# Patient Record
Sex: Male | Born: 1950 | Race: White | Hispanic: No | Marital: Married | Smoking: Current every day smoker
Health system: Southern US, Community
[De-identification: ages and names within clinical notes are randomized; demographics above are authoritative.]

## PROBLEM LIST (undated history)

## (undated) DIAGNOSIS — E669 Obesity, unspecified: Secondary | ICD-10-CM

## (undated) DIAGNOSIS — H9192 Unspecified hearing loss, left ear: Secondary | ICD-10-CM

## (undated) DIAGNOSIS — Z86010 Personal history of colonic polyps: Secondary | ICD-10-CM

## (undated) DIAGNOSIS — M1712 Unilateral primary osteoarthritis, left knee: Secondary | ICD-10-CM

## (undated) DIAGNOSIS — Z0000 Encounter for general adult medical examination without abnormal findings: Secondary | ICD-10-CM

## (undated) DIAGNOSIS — R5383 Other fatigue: Secondary | ICD-10-CM

## (undated) DIAGNOSIS — F329 Major depressive disorder, single episode, unspecified: Secondary | ICD-10-CM

## (undated) DIAGNOSIS — F17200 Nicotine dependence, unspecified, uncomplicated: Secondary | ICD-10-CM

## (undated) DIAGNOSIS — R5381 Other malaise: Secondary | ICD-10-CM

## (undated) DIAGNOSIS — G43909 Migraine, unspecified, not intractable, without status migrainosus: Secondary | ICD-10-CM

## (undated) DIAGNOSIS — F32A Depression, unspecified: Secondary | ICD-10-CM

## (undated) DIAGNOSIS — I10 Essential (primary) hypertension: Secondary | ICD-10-CM

## (undated) DIAGNOSIS — K115 Sialolithiasis: Secondary | ICD-10-CM

## (undated) DIAGNOSIS — E785 Hyperlipidemia, unspecified: Secondary | ICD-10-CM

## (undated) DIAGNOSIS — J189 Pneumonia, unspecified organism: Secondary | ICD-10-CM

## (undated) DIAGNOSIS — N400 Enlarged prostate without lower urinary tract symptoms: Secondary | ICD-10-CM

## (undated) DIAGNOSIS — E1169 Type 2 diabetes mellitus with other specified complication: Secondary | ICD-10-CM

## (undated) HISTORY — DX: Unspecified hearing loss, left ear: H91.92

## (undated) HISTORY — DX: Hyperlipidemia, unspecified: E78.5

## (undated) HISTORY — DX: Personal history of colonic polyps: Z86.010

## (undated) HISTORY — DX: Nicotine dependence, unspecified, uncomplicated: F17.200

## (undated) HISTORY — DX: Other malaise: R53.81

## (undated) HISTORY — PX: FINGER FRACTURE SURGERY: SHX638

## (undated) HISTORY — PX: OTHER SURGICAL HISTORY: SHX169

## (undated) HISTORY — DX: Obesity, unspecified: E66.9

## (undated) HISTORY — DX: Migraine, unspecified, not intractable, without status migrainosus: G43.909

## (undated) HISTORY — DX: Type 2 diabetes mellitus with other specified complication: E11.69

## (undated) HISTORY — DX: Encounter for general adult medical examination without abnormal findings: Z00.00

## (undated) HISTORY — DX: Other fatigue: R53.83

## (undated) HISTORY — DX: Essential (primary) hypertension: I10

## (undated) HISTORY — DX: Unilateral primary osteoarthritis, left knee: M17.12

## (undated) HISTORY — DX: Sialolithiasis: K11.5

---

## 2007-04-11 DIAGNOSIS — F32A Depression, unspecified: Secondary | ICD-10-CM | POA: Insufficient documentation

## 2007-04-11 DIAGNOSIS — E669 Obesity, unspecified: Secondary | ICD-10-CM | POA: Insufficient documentation

## 2007-04-11 DIAGNOSIS — E7800 Pure hypercholesterolemia, unspecified: Secondary | ICD-10-CM | POA: Insufficient documentation

## 2007-04-11 HISTORY — DX: Pure hypercholesterolemia, unspecified: E78.00

## 2007-04-11 HISTORY — DX: Obesity, unspecified: E66.9

## 2008-08-03 DIAGNOSIS — J683 Other acute and subacute respiratory conditions due to chemicals, gases, fumes and vapors: Secondary | ICD-10-CM

## 2008-08-03 HISTORY — DX: Other acute and subacute respiratory conditions due to chemicals, gases, fumes and vapors: J68.3

## 2010-04-10 DIAGNOSIS — E119 Type 2 diabetes mellitus without complications: Secondary | ICD-10-CM

## 2010-04-10 DIAGNOSIS — R519 Headache, unspecified: Secondary | ICD-10-CM | POA: Insufficient documentation

## 2010-04-10 DIAGNOSIS — N400 Enlarged prostate without lower urinary tract symptoms: Secondary | ICD-10-CM | POA: Insufficient documentation

## 2010-04-10 HISTORY — DX: Benign prostatic hyperplasia without lower urinary tract symptoms: N40.0

## 2010-04-10 HISTORY — DX: Type 2 diabetes mellitus without complications: E11.9

## 2010-04-10 HISTORY — DX: Headache, unspecified: R51.9

## 2010-06-13 DIAGNOSIS — N529 Male erectile dysfunction, unspecified: Secondary | ICD-10-CM

## 2010-06-13 HISTORY — DX: Male erectile dysfunction, unspecified: N52.9

## 2011-08-20 DIAGNOSIS — R6882 Decreased libido: Secondary | ICD-10-CM

## 2011-08-20 HISTORY — DX: Decreased libido: R68.82

## 2011-09-24 DIAGNOSIS — I10 Essential (primary) hypertension: Secondary | ICD-10-CM

## 2011-09-24 DIAGNOSIS — F419 Anxiety disorder, unspecified: Secondary | ICD-10-CM

## 2011-09-24 DIAGNOSIS — E291 Testicular hypofunction: Secondary | ICD-10-CM

## 2011-09-24 HISTORY — DX: Testicular hypofunction: E29.1

## 2011-09-24 HISTORY — DX: Anxiety disorder, unspecified: F41.9

## 2011-09-24 HISTORY — DX: Essential (primary) hypertension: I10

## 2012-05-22 ENCOUNTER — Ambulatory Visit (INDEPENDENT_AMBULATORY_CARE_PROVIDER_SITE_OTHER): Payer: BC Managed Care – PPO | Admitting: Internal Medicine

## 2012-05-22 ENCOUNTER — Encounter: Payer: Self-pay | Admitting: Internal Medicine

## 2012-05-22 VITALS — BP 152/88 | HR 102 | Temp 98.4°F | Resp 16 | Ht 70.5 in | Wt 236.0 lb

## 2012-05-22 DIAGNOSIS — E669 Obesity, unspecified: Secondary | ICD-10-CM

## 2012-05-22 DIAGNOSIS — Z79899 Other long term (current) drug therapy: Secondary | ICD-10-CM

## 2012-05-22 DIAGNOSIS — E1169 Type 2 diabetes mellitus with other specified complication: Secondary | ICD-10-CM

## 2012-05-22 DIAGNOSIS — I10 Essential (primary) hypertension: Secondary | ICD-10-CM

## 2012-05-22 DIAGNOSIS — J069 Acute upper respiratory infection, unspecified: Secondary | ICD-10-CM

## 2012-05-22 DIAGNOSIS — E119 Type 2 diabetes mellitus without complications: Secondary | ICD-10-CM

## 2012-05-22 DIAGNOSIS — E785 Hyperlipidemia, unspecified: Secondary | ICD-10-CM

## 2012-05-22 MED ORDER — AZITHROMYCIN 250 MG PO TABS: ORAL_TABLET | ORAL | Status: AC

## 2012-05-22 MED ORDER — HYDROCOD POLST-CHLORPHEN POLST 10-8 MG/5ML PO LQCR: 5.0000 mL | Freq: Every evening | ORAL | Status: DC | PRN

## 2012-05-22 NOTE — Patient Instructions (Signed)
Please schedule fasting labs for Monday- cbc, chem7, a1c, urine microalbumin-250.00 and lipid/lft-272.4 Also schedule chem7, a1c-250.00 prior to next visit

## 2012-05-25 DIAGNOSIS — E1169 Type 2 diabetes mellitus with other specified complication: Secondary | ICD-10-CM

## 2012-05-25 DIAGNOSIS — I10 Essential (primary) hypertension: Secondary | ICD-10-CM

## 2012-05-25 DIAGNOSIS — E669 Obesity, unspecified: Secondary | ICD-10-CM

## 2012-05-25 DIAGNOSIS — J069 Acute upper respiratory infection, unspecified: Secondary | ICD-10-CM | POA: Insufficient documentation

## 2012-05-25 DIAGNOSIS — E785 Hyperlipidemia, unspecified: Secondary | ICD-10-CM | POA: Insufficient documentation

## 2012-05-25 HISTORY — DX: Hyperlipidemia, unspecified: E78.5

## 2012-05-25 HISTORY — DX: Obesity, unspecified: E66.9

## 2012-05-25 HISTORY — DX: Type 2 diabetes mellitus with other specified complication: E11.69

## 2012-05-25 HISTORY — DX: Essential (primary) hypertension: I10

## 2012-05-25 NOTE — Assessment & Plan Note (Signed)
Given printed antibiotic to hold. Begin if symptoms do not improve after total duration of 8-10 days. Given Tussionex when necessary cough-cautioned regarding possible sedating effect. Followup if no improvement or worsening.

## 2012-05-25 NOTE — Assessment & Plan Note (Signed)
Obtain CBC, Chem-7, A1c and urine micro-albumen.

## 2012-05-25 NOTE — Assessment & Plan Note (Signed)
Suboptimal control. Patient will check dose of lisinopril at home and call clinic. Refill medication at that time and monitor blood pressure regularly

## 2012-05-25 NOTE — Assessment & Plan Note (Signed)
Maintained on statin therapy. Obtain fasting lipid profile and liver function tests 

## 2012-05-25 NOTE — Progress Notes (Signed)
  Subjective:    Patient ID: Nathan Hess, male    DOB: [birth year], 61 y.o.   MRN: [id number]  HPI patient presents to clinic to establish care and followup of multiple medical problems. Blood pressure elevated without symptoms. States it is consistently elevated but were in fosinopril. Dose is unknown. Has history of diabetes with unknown control. Diabetic eye exam up to date. Meds one-week history of cough productive for green sputum and ear fullness. No fever chills shortness of breath or wheezing. No other complaints.  PMH: DM, hyperlipidemia, HTN No past surgical history on file.  reports that he has been smoking Cigarettes.  He does not have any smokeless tobacco history on file. His alcohol and drug histories not on file. family history is not on file. No Known Allergies   Review of Systems  Constitutional: Negative for fever and chills.  HENT: Positive for sinus pressure.   Respiratory: Positive for cough. Negative for shortness of breath and wheezing.   Cardiovascular: Negative for chest pain.  All other systems reviewed and are negative.       Objective:   Physical Exam  Nursing note and vitals reviewed. Constitutional: He appears well-developed and well-nourished. No distress.  HENT:  Head: Normocephalic and atraumatic.  Right Ear: External ear normal.  Left Ear: External ear normal.  Nose: Nose normal.  Mouth/Throat: Oropharynx is clear and moist. No oropharyngeal exudate.  Eyes: Conjunctivae normal are normal. No scleral icterus.  Neck: Neck supple. No thyromegaly present.  Cardiovascular: Normal rate, regular rhythm and normal heart sounds.  Exam reveals no gallop and no friction rub.   No murmur Hess. Pulmonary/Chest: Effort normal and breath sounds normal. No respiratory distress. He has no wheezes. He has no rales.  Lymphadenopathy:    He has no cervical adenopathy.  Neurological: He is alert.  Skin: Skin is warm and dry. He is not diaphoretic.           Assessment & Plan:

## 2012-06-04 ENCOUNTER — Telehealth: Payer: Self-pay | Admitting: Internal Medicine

## 2012-06-04 DIAGNOSIS — Z79899 Other long term (current) drug therapy: Secondary | ICD-10-CM

## 2012-06-04 DIAGNOSIS — E119 Type 2 diabetes mellitus without complications: Secondary | ICD-10-CM

## 2012-06-04 NOTE — Telephone Encounter (Signed)
Lab order week of 08-11-2012 Also schedule chem7, a1c-250.00 prior to next visit

## 2012-07-14 ENCOUNTER — Telehealth: Payer: Self-pay | Admitting: *Deleted

## 2012-07-14 DIAGNOSIS — E1169 Type 2 diabetes mellitus with other specified complication: Secondary | ICD-10-CM

## 2012-07-14 DIAGNOSIS — E669 Obesity, unspecified: Secondary | ICD-10-CM

## 2012-07-14 DIAGNOSIS — Z79899 Other long term (current) drug therapy: Secondary | ICD-10-CM

## 2012-07-14 DIAGNOSIS — E785 Hyperlipidemia, unspecified: Secondary | ICD-10-CM

## 2012-07-14 DIAGNOSIS — I10 Essential (primary) hypertension: Secondary | ICD-10-CM

## 2012-07-14 MED ORDER — VENLAFAXINE HCL 75 MG PO TABS: 75.0000 mg | ORAL_TABLET | Freq: Three times a day (TID) | ORAL | Status: DC

## 2012-07-14 NOTE — Addendum Note (Signed)
Addended by: Regis Bill on: 07/14/2012 09:16 AM   Modules accepted: Orders

## 2012-07-14 NOTE — Telephone Encounter (Signed)
Pt requesting refill on Effexor, will run out on Tuesday; pt did not do f/u labs requested at 10.31.13 OV but did schedule f.u OV 01.30.14. Rx done for 30-day & pt informed to keep f/u OV appt & have Fasting labs done 3-5 days prior, pt understood & agreed Andrey Cota been working 60+ hrs weekly]/SLS

## 2012-07-28 LAB — HEPATIC FUNCTION PANEL
AST: 21 U/L (ref 0–37)
Albumin: 4.8 g/dL (ref 3.5–5.2)
Alkaline Phosphatase: 64 U/L (ref 39–117)
Indirect Bilirubin: 0.4 mg/dL (ref 0.0–0.9)
Total Protein: 7.3 g/dL (ref 6.0–8.3)

## 2012-07-28 LAB — LIPID PANEL
HDL: 39 mg/dL — ABNORMAL LOW (ref >39)
LDL Cholesterol: 147 mg/dL — ABNORMAL HIGH (ref 0–99)
Triglycerides: 121 mg/dL (ref <150)

## 2012-07-28 LAB — BASIC METABOLIC PANEL
BUN: 24 mg/dL — ABNORMAL HIGH (ref 6–23)
Chloride: 103 mEq/L (ref 96–112)
Potassium: 4.9 mEq/L (ref 3.5–5.3)
Sodium: 139 mEq/L (ref 135–145)

## 2012-07-28 LAB — CBC WITH DIFFERENTIAL/PLATELET
Eosinophils Absolute: 0.1 10*3/uL (ref 0.0–0.7)
Eosinophils Relative: 1 % (ref 0–5)
HCT: 44.3 % (ref 39.0–52.0)
Hemoglobin: 15.8 g/dL (ref 13.0–17.0)
Lymphocytes Relative: 26 % (ref 12–46)
Lymphs Abs: 2 10*3/uL (ref 0.7–4.0)
MCH: 32 pg (ref 26.0–34.0)
MCV: 89.7 fL (ref 78.0–100.0)
Monocytes Absolute: 0.6 10*3/uL (ref 0.1–1.0)
Monocytes Relative: 8 % (ref 3–12)
Platelets: 232 10*3/uL (ref 150–400)
RBC: 4.94 MIL/uL (ref 4.22–5.81)
WBC: 7.9 10*3/uL (ref 4.0–10.5)

## 2012-07-28 NOTE — Telephone Encounter (Signed)
Lab orders released/SLS 

## 2012-07-28 NOTE — Addendum Note (Signed)
Addended by: Regis Bill on: 07/28/2012 10:23 AM   Modules accepted: Orders

## 2012-07-28 NOTE — Telephone Encounter (Signed)
Lab orders placed 12.23.13; released to [facility] lab/SLS

## 2012-07-29 LAB — MICROALBUMIN / CREATININE URINE RATIO
Creatinine, Urine: 133.7 mg/dL
Microalb Creat Ratio: 3.7 mg/g (ref 0.0–30.0)
Microalb, Ur: 0.5 mg/dL (ref 0.00–1.89)

## 2012-07-31 ENCOUNTER — Encounter: Payer: Self-pay | Admitting: Internal Medicine

## 2012-07-31 ENCOUNTER — Ambulatory Visit (INDEPENDENT_AMBULATORY_CARE_PROVIDER_SITE_OTHER): Payer: BC Managed Care – PPO | Admitting: Internal Medicine

## 2012-07-31 VITALS — BP 150/98 | HR 107 | Temp 98.2°F | Resp 16 | Ht 70.5 in | Wt 234.0 lb

## 2012-07-31 DIAGNOSIS — E785 Hyperlipidemia, unspecified: Secondary | ICD-10-CM

## 2012-07-31 DIAGNOSIS — E119 Type 2 diabetes mellitus without complications: Secondary | ICD-10-CM

## 2012-07-31 DIAGNOSIS — Z23 Encounter for immunization: Secondary | ICD-10-CM

## 2012-07-31 DIAGNOSIS — E1169 Type 2 diabetes mellitus with other specified complication: Secondary | ICD-10-CM

## 2012-07-31 DIAGNOSIS — G43909 Migraine, unspecified, not intractable, without status migrainosus: Secondary | ICD-10-CM

## 2012-07-31 DIAGNOSIS — I10 Essential (primary) hypertension: Secondary | ICD-10-CM

## 2012-07-31 DIAGNOSIS — E669 Obesity, unspecified: Secondary | ICD-10-CM

## 2012-07-31 MED ORDER — VENLAFAXINE HCL 75 MG PO TABS: 75.0000 mg | ORAL_TABLET | Freq: Three times a day (TID) | ORAL | Status: DC

## 2012-07-31 MED ORDER — ROSUVASTATIN CALCIUM 20 MG PO TABS: 20.0000 mg | ORAL_TABLET | Freq: Every day | ORAL | Status: DC

## 2012-07-31 MED ORDER — LISINOPRIL 10 MG PO TABS: 10.0000 mg | ORAL_TABLET | Freq: Every day | ORAL | Status: DC

## 2012-07-31 MED ORDER — HYDROCODONE-ACETAMINOPHEN 5-325 MG PO TABS: 1.0000 | ORAL_TABLET | Freq: Four times a day (QID) | ORAL | Status: DC | PRN

## 2012-07-31 NOTE — Patient Instructions (Addendum)
Please schedule fasting labs prior to next visit Chem7, a1c-250.0 and lipid/lft-272.4 

## 2012-08-03 DIAGNOSIS — G43909 Migraine, unspecified, not intractable, without status migrainosus: Secondary | ICD-10-CM | POA: Insufficient documentation

## 2012-08-03 HISTORY — DX: Migraine, unspecified, not intractable, without status migrainosus: G43.909

## 2012-08-03 NOTE — Assessment & Plan Note (Signed)
Given small quantity of hydrocodone for sparing use. Aware of potential for addiction and/or tolerance

## 2012-08-03 NOTE — Assessment & Plan Note (Signed)
suboptimal control. Resume statin tx.

## 2012-08-03 NOTE — Assessment & Plan Note (Signed)
a1c under average control. Encourage diet/exercise and wt loss.

## 2012-08-03 NOTE — Assessment & Plan Note (Signed)
Out of medication. Refill and monitor bp as outpt.

## 2012-08-03 NOTE — Progress Notes (Signed)
  Subjective:    Patient ID: Nathan Hess, male    DOB: 10/06/1950, 62 y.o.   MRN: [id number]  HPI Pt presents to clinic for followup of multiple medical problems. BP elevated but has been out of bp medication. No associated sx's. Has rare use of hydrocodone for migraine ha's. No evidence of addiction or tolerance. H/o hypogonadism but testosterone supplementation was too expensive.  No past medical history on file. No past surgical history on file.  reports that he has been smoking Cigarettes.  He does not have any smokeless tobacco history on file. His alcohol and drug histories not on file. family history is not on file. No Known Allergies    Review of Systems see hpi     Objective:   Physical Exam  Physical Exam  Nursing note and vitals reviewed. Constitutional: Appears well-developed and well-nourished. No distress.  HENT:  Head: Normocephalic and atraumatic.  Right Ear: External ear normal.  Left Ear: External ear normal.  Eyes: Conjunctivae are normal. No scleral icterus.  Neck: Neck supple. Carotid bruit is not present.  Cardiovascular: Normal rate, regular rhythm and normal heart sounds.  Exam reveals no gallop and no friction rub.   No murmur heard. Pulmonary/Chest: Effort normal and breath sounds normal. No respiratory distress. He has no wheezes. no rales.  Lymphadenopathy:    He has no cervical adenopathy.  Neurological:Alert.  Skin: Skin is warm and dry. Not diaphoretic.  Psychiatric: Has a normal mood and affect.        Assessment & Plan:

## 2012-08-21 ENCOUNTER — Ambulatory Visit: Payer: BC Managed Care – PPO | Admitting: Internal Medicine

## 2012-08-21 ENCOUNTER — Telehealth: Payer: Self-pay | Admitting: Internal Medicine

## 2012-08-21 MED ORDER — VENLAFAXINE HCL 75 MG PO TABS: 75.0000 mg | ORAL_TABLET | Freq: Three times a day (TID) | ORAL | Status: DC

## 2012-08-21 NOTE — Telephone Encounter (Signed)
He came to see Dr Rodena Medin to change doctors from his doctor in [place].  They discussed his meds.  His rx for effexor 75mg   Take 1 pill 3 times per day qty 90. Did not get this prescription.  Needs to go to [facility] on [address] in [place]

## 2012-10-14 ENCOUNTER — Telehealth: Payer: Self-pay | Admitting: *Deleted

## 2012-10-14 MED ORDER — VENLAFAXINE HCL 75 MG PO TABS: 75.0000 mg | ORAL_TABLET | Freq: Three times a day (TID) | ORAL | Status: DC

## 2012-10-14 NOTE — Telephone Encounter (Signed)
Received call from [facility] in [place] requesting to give pt 90 days supply of venlafaxine (#270). Gave verbal to dispense #270 x no refills as pt has f/u in April.

## 2012-10-30 ENCOUNTER — Encounter: Payer: Self-pay | Admitting: Family Medicine

## 2012-10-30 ENCOUNTER — Ambulatory Visit (INDEPENDENT_AMBULATORY_CARE_PROVIDER_SITE_OTHER): Payer: BC Managed Care – PPO | Admitting: Family Medicine

## 2012-10-30 VITALS — BP 132/82 | HR 88 | Temp 98.5°F | Ht 70.5 in | Wt 228.0 lb

## 2012-10-30 DIAGNOSIS — E669 Obesity, unspecified: Secondary | ICD-10-CM

## 2012-10-30 DIAGNOSIS — E119 Type 2 diabetes mellitus without complications: Secondary | ICD-10-CM

## 2012-10-30 DIAGNOSIS — E785 Hyperlipidemia, unspecified: Secondary | ICD-10-CM

## 2012-10-30 DIAGNOSIS — E1169 Type 2 diabetes mellitus with other specified complication: Secondary | ICD-10-CM

## 2012-10-30 DIAGNOSIS — R5383 Other fatigue: Secondary | ICD-10-CM

## 2012-10-30 DIAGNOSIS — F17200 Nicotine dependence, unspecified, uncomplicated: Secondary | ICD-10-CM

## 2012-10-30 DIAGNOSIS — Z0000 Encounter for general adult medical examination without abnormal findings: Secondary | ICD-10-CM

## 2012-10-30 DIAGNOSIS — R5381 Other malaise: Secondary | ICD-10-CM

## 2012-10-30 HISTORY — DX: Nicotine dependence, unspecified, uncomplicated: F17.200

## 2012-10-30 MED ORDER — VENLAFAXINE HCL 75 MG PO TABS: 75.0000 mg | ORAL_TABLET | Freq: Three times a day (TID) | ORAL | Status: DC

## 2012-11-01 ENCOUNTER — Encounter: Payer: Self-pay | Admitting: Family Medicine

## 2012-11-01 DIAGNOSIS — R5381 Other malaise: Secondary | ICD-10-CM

## 2012-11-01 DIAGNOSIS — R5383 Other fatigue: Secondary | ICD-10-CM | POA: Insufficient documentation

## 2012-11-01 HISTORY — DX: Other fatigue: R53.83

## 2012-11-01 HISTORY — DX: Other malaise: R53.81

## 2012-11-01 NOTE — Progress Notes (Signed)
Patient ID: Nathan Hess, male   DOB: 23-Jul-1951, 62 y.o.   MRN: [id number] KORBAN SHEARER [id number] 04-05-1951 11/01/2012      Progress Note-Follow Up  Subjective  Chief Complaint  Chief Complaint  Patient presents with  . Follow-up    3 month    HPI  Patient is a 62 year old male who is in today for followup. He struggles with fatigue. Notes some restless sleep and a.m. headaches as well. No recent illness. No congestion or chest pain. No palpitations or shortness of breath. No GI or GU complaints. He is taking his medications as prescribed.  Past Medical History  Diagnosis Date  . Migraine headache 08/03/2012  . Unspecified essential hypertension 05/25/2012  . Diabetes mellitus type 2 in obese 05/25/2012  . Hyperlipidemia 05/25/2012  . Tobacco use disorder 10/30/2012  . Other malaise and fatigue 11/01/2012    History reviewed. No pertinent past surgical history.  History reviewed. No pertinent family history.  History   Social History  . Marital Status: Married    Spouse Name: N/A    Number of Children: N/A  . Years of Education: N/A   Occupational History  . Not on file.   Social History Main Topics  . Smoking status: Current Every Day Smoker    Types: Cigarettes  . Smokeless tobacco: Not on file  . Alcohol Use: Not on file  . Drug Use: Not on file  . Sexually Active: Not on file   Other Topics Concern  . Not on file   Social History Narrative  . No narrative on file    Current Outpatient Prescriptions on File Prior to Visit  Medication Sig Dispense Refill  . acetaminophen (TYLENOL) 325 MG tablet Take 650 mg by mouth as needed.      . cyanocobalamin 100 MCG tablet Take 100 mcg by mouth daily.      Marland Kitchen lisinopril (PRINIVIL,ZESTRIL) 10 MG tablet Take 1 tablet (10 mg total) by mouth daily.  30 tablet  6  . metFORMIN (GLUCOPHAGE) 500 MG tablet Take 500 mg by mouth daily with breakfast.      . rosuvastatin (CRESTOR) 20 MG tablet Take 1 tablet (20 mg  total) by mouth daily.  30 tablet  6  . HYDROcodone-acetaminophen (NORCO/VICODIN) 5-325 MG per tablet Take 1 tablet by mouth every 6 (six) hours as needed for pain.  15 tablet  1   No current facility-administered medications on file prior to visit.    No Known Allergies  Review of Systems  Review of Systems  Constitutional: Positive for malaise/fatigue. Negative for fever.  HENT: Negative for congestion.   Eyes: Negative for discharge.  Respiratory: Negative for shortness of breath.   Cardiovascular: Negative for chest pain, palpitations and leg swelling.  Gastrointestinal: Negative for nausea, abdominal pain and diarrhea.  Genitourinary: Negative for dysuria.  Musculoskeletal: Negative for falls.  Skin: Negative for rash.  Neurological: Positive for headaches. Negative for loss of consciousness.  Endo/Heme/Allergies: Negative for polydipsia.  Psychiatric/Behavioral: Negative for depression and suicidal ideas. The patient is not nervous/anxious and does not have insomnia.     Objective  BP 132/82  Pulse 88  Temp(Src) 98.5 F (36.9 C) (Oral)  Ht 5' 10.5" (1.791 m)  Wt 228 lb (103.42 kg)  BMI 32.24 kg/m2  SpO2 95%  Physical Exam  Physical Exam  Constitutional: He is oriented to person, place, and time and well-developed, well-nourished, and in no distress. No distress.  HENT:  Head: Normocephalic and atraumatic.  Eyes: Conjunctivae are normal.  Neck: Neck supple. No thyromegaly present.  Cardiovascular: Normal rate, regular rhythm and normal heart sounds.   No murmur heard. Pulmonary/Chest: Effort normal and breath sounds normal. No respiratory distress.  Abdominal: He exhibits no distension and no mass. There is no tenderness.  Musculoskeletal: He exhibits no edema.  Neurological: He is alert and oriented to person, place, and time.  Skin: Skin is warm.  Psychiatric: Memory, affect and judgment normal.    No results found for this basename: TSH   Lab Results   Component Value Date   WBC 7.9 07/28/2012   HGB 15.8 07/28/2012   HCT 44.3 07/28/2012   MCV 89.7 07/28/2012   PLT 232 07/28/2012   Lab Results  Component Value Date   CREATININE 0.92 07/28/2012   BUN 24* 07/28/2012   NA 139 07/28/2012   K 4.9 07/28/2012   CL 103 07/28/2012   CO2 29 07/28/2012   Lab Results  Component Value Date   ALT 42 07/28/2012   AST 21 07/28/2012   ALKPHOS 64 07/28/2012   BILITOT 0.5 07/28/2012   Lab Results  Component Value Date   CHOL 210* 07/28/2012   Lab Results  Component Value Date   HDL 39* 07/28/2012   Lab Results  Component Value Date   LDLCALC 147* 07/28/2012   Lab Results  Component Value Date   TRIG 121 07/28/2012   Lab Results  Component Value Date   CHOLHDL 5.4 07/28/2012     Assessment & Plan  Other malaise and fatigue Acknowledges snoring, am HA, has restless sleep and HTN, encouraged sleep study, he agrees he probably procced with sleep study at next visit.  He will call if he agrees to proceed sooner.   Tobacco use disorder Encouraged complete cessation.   Diabetes mellitus type 2 in obese hgba1c 6.6, minimzie simple carbs and continue current meds.   Hyperlipidemia Tolerating Crestor, avoid trans fats and increase activity, start  Krill oil cap

## 2012-11-01 NOTE — Assessment & Plan Note (Signed)
Encouraged complete cessation. 

## 2012-11-01 NOTE — Assessment & Plan Note (Signed)
Acknowledges snoring, am HA, has restless sleep and HTN, encouraged sleep study, he agrees he probably procced with sleep study at next visit.  He will call if he agrees to proceed sooner.

## 2012-11-01 NOTE — Assessment & Plan Note (Signed)
hgba1c 6.6, minimzie simple carbs and continue current meds.

## 2012-11-01 NOTE — Assessment & Plan Note (Addendum)
Tolerating Crestor, avoid trans fats and increase activity, start  Krill oil cap

## 2012-11-04 ENCOUNTER — Telehealth: Payer: Self-pay | Admitting: Family Medicine

## 2012-11-04 MED ORDER — ROSUVASTATIN CALCIUM 20 MG PO TABS: 20.0000 mg | ORAL_TABLET | Freq: Every day | ORAL | Status: DC

## 2012-11-04 NOTE — Telephone Encounter (Signed)
Refill- crestor 40mg  tablet. Qty 90

## 2013-01-29 ENCOUNTER — Ambulatory Visit (INDEPENDENT_AMBULATORY_CARE_PROVIDER_SITE_OTHER): Payer: BC Managed Care – PPO | Admitting: Internal Medicine

## 2013-01-29 VITALS — BP 128/82 | HR 86 | Temp 97.7°F | Resp 18 | Ht 71.5 in | Wt 235.0 lb

## 2013-01-29 DIAGNOSIS — H5712 Ocular pain, left eye: Secondary | ICD-10-CM

## 2013-01-29 DIAGNOSIS — H109 Unspecified conjunctivitis: Secondary | ICD-10-CM

## 2013-01-29 DIAGNOSIS — H5710 Ocular pain, unspecified eye: Secondary | ICD-10-CM

## 2013-01-29 MED ORDER — OFLOXACIN 0.3 % OP SOLN: 1.0000 [drp] | OPHTHALMIC | Status: DC

## 2013-01-29 NOTE — Patient Instructions (Addendum)
Eye, Foreign Body The term foreign body refers to any object near, on the surface of or in the eye that should not be there. A foreign body may be a small speck of dirt or dust, a hair or eyelash, a splinter or any object. CAUSES  Foreign bodies can get in the eye by:  Flying pieces of something that was broken or destroyed (debris).  A sudden injury (trauma) to the eye. SYMPTOMS  Symptoms depend on what the foreign body is and where it is in the eye. The most common locations are:  On the inner surface of the upper or lower eyelids or on the covering of the white part of the eye (conjunctiva). Symptoms in this location are:  Irritating and painful, especially when blinking.  Feeling like something is in the eye.  On the surface of the clear covering on the front of the eye (cornea). A corneal foreign body has symptoms that:  Are painful and irritating since the cornea is very sensitive.  Form small "rust rings" around a metallic foreign body. Metallic foreign bodies stick more firmly to the surface of the cornea.  Inside the eyeball. Infection can happen fast and can be hard to treat with antibiotics. This is an extremely dangerous situation. Foreign bodies inside the eye can threaten vision. A person may even loose their eye. Foreign bodies inside the eye may cause:  Great pain.  Immediate loss of vision. DIAGNOSIS  Foreign bodies are found during an exam by an eye specialist. Those that are on the eyelids, conjunctiva or cornea are usually (but not always) easily found. When a foreign body is inside the eyeball, a cataract may form almost right away. This makes it hard for an ophthalmologist to find the foreign body. Special tests may be needed, including ultrasound testing, X-rays and CT scans. TREATMENT   Foreign bodies that are on the eyelids, conjunctiva or cornea are often removed easily and painlessly.  If the foreign body has caused a scratch or abrasion of the cornea,  antibiotic drops, ointments and/or a tight patch called a "pressure patch" may be needed. Follow-up exams will be needed for several days until the abrasion heals.  Surgery is needed right away if the foreign body is inside the eyeball. This is a medical emergency. An antibiotic therapy will likely be given to stop an infection. HOME CARE INSTRUCTIONS  The use of eye patches is not universal. Their use varies from state to state and from caregiver to caregiver. If an eye patch was applied:  Keep the eye patch on for as long as directed by your caregiver until the follow-up appointment.  Do not remove the patch to put in medications unless instructed to do so. When replacing the patch, retape it as it was before. Follow the same procedure if the patch becomes loose.  WARNING: Do not drive or operate machinery while the eye is patched. The ability to judge distances will be impaired.  Only take over-the-counter or prescription medicines for pain, discomfort or fever as directed by the caregiver. If no eye patch was applied:  Keep the eye closed as much as possible. Do not rub the eye.  Wear dark glasses as needed to protect the eyes from bright light.  Do not wear contact lenses until the eye feels normal again, or as instructed.  Wear protective eye covering if there is a risk of eye injury. This is important when working with high speed tools.  Only take over-the-counter or   feels normal again, or as instructed.   Wear protective eye covering if there is a risk of eye injury. This is important when working with high speed tools.   Only take over-the-counter or prescription medicines for pain, discomfort or fever as directed by the caregiver.  SEEK IMMEDIATE MEDICAL CARE IF:    Pain increases in the eye or the vision changes.   You or your child has problems with the eye patch.   The injury to the eye appears to be getting larger.   There is discharge from the injured eye.   Swelling and/or soreness (inflammation) develops around the affected eye.   You or your child has an oral temperature above 102 F (38.9 C), not controlled by medicine.   Your baby is older than 3  months with a rectal temperature of 102 F (38.9 C) or higher.   Your baby is 3 months old or younger with a rectal temperature of 100.4 F (38 C) or higher.  MAKE SURE YOU:    Understand these instructions.   Will watch your condition.   Will get help right away if you are not doing well or get worse.  Document Released: 07/09/2005 Document Revised: 10/01/2011 Document Reviewed: 02/26/2008  [employer] Patient Information 2014 [employer]

## 2013-01-29 NOTE — Progress Notes (Signed)
  Subjective:    Patient ID: Nathan Hess, male    DOB: 1951-07-09, 62 y.o.   MRN: [id number]  HPI Mowing yard and got FB in eye, better today, but wants to be examined. No eye disease or visual loss, no discharge and minimal red.   Review of Systems     Objective:   Physical Exam  Constitutional: He is oriented to person, place, and time. He appears well-developed and well-nourished.  Eyes: Conjunctivae, EOM and lids are normal. Pupils are equal, round, and reactive to light. No foreign bodies found. Right eye exhibits no discharge. Left eye exhibits no chemosis, no discharge, no exudate and no hordeolum. No foreign body present in the left eye. Left conjunctiva is not injected. Left eye exhibits normal extraocular motion.  Pulmonary/Chest: Effort normal.  Neurological: He is alert and oriented to person, place, and time. He exhibits normal muscle tone. Coordination normal.  Psychiatric: He has a normal mood and affect.   No uptake with fluricein Irrigated copiously       Assessment & Plan:  Normal exam Ocuflox prn/See opthamology if persists

## 2013-02-16 ENCOUNTER — Other Ambulatory Visit: Payer: Self-pay | Admitting: Family Medicine

## 2013-02-17 ENCOUNTER — Telehealth: Payer: Self-pay

## 2013-02-17 ENCOUNTER — Other Ambulatory Visit: Payer: Self-pay

## 2013-02-17 MED ORDER — VENLAFAXINE HCL 75 MG PO TABS: 75.0000 mg | ORAL_TABLET | Freq: Three times a day (TID) | ORAL | Status: DC

## 2013-02-17 NOTE — Telephone Encounter (Signed)
Pharmacy needs 90 day supply of Effexor. RX sent

## 2013-03-26 ENCOUNTER — Encounter: Payer: Self-pay | Admitting: Physician Assistant

## 2013-03-26 ENCOUNTER — Other Ambulatory Visit: Payer: Self-pay | Admitting: Physician Assistant

## 2013-03-26 DIAGNOSIS — M1712 Unilateral primary osteoarthritis, left knee: Secondary | ICD-10-CM | POA: Insufficient documentation

## 2013-03-26 NOTE — H&P (Signed)
TOTAL KNEE ADMISSION H&P  Patient is being admitted for left unicompartmental arthroplasty.  Subjective:  Chief Complaint:left knee pain.  HPI: Nathan Hess, 62 y.o. male, has a history of pain and functional disability in the left knee due to arthritis and has failed non-surgical conservative treatments for greater than 12 weeks to includeNSAID's and/or analgesics, corticosteriod injections, viscosupplementation injections, flexibility and strengthening excercises, use of assistive devices, weight reduction as appropriate and activity modification.  Onset of symptoms was gradual, starting 10 years ago with gradually worsening course since that time. The patient noted prior procedures on the knee to include  arthroscopy and menisectomy on the left knee(s).  Patient currently rates pain in the left knee(s) at 2 out of 10 with activity. Patient has night pain, worsening of pain with activity and weight bearing, pain that interferes with activities of daily living, crepitus and joint swelling.  Patient has evidence of subchondral sclerosis, periarticular osteophytes and joint space narrowing by imaging studies.There is no active infection.  Patient Active Problem List   Diagnosis Date Noted  . Left knee DJD   . Other malaise and fatigue 11/01/2012  . Tobacco use disorder 10/30/2012  . Migraine headache 08/03/2012  . Diabetes mellitus type 2 in obese 05/25/2012  . Hyperlipidemia 05/25/2012  . Unspecified essential hypertension 05/25/2012   Past Medical History  Diagnosis Date  . Migraine headache 08/03/2012  . Unspecified essential hypertension 05/25/2012  . Diabetes mellitus type 2 in obese 05/25/2012  . Hyperlipidemia 05/25/2012  . Tobacco use disorder 10/30/2012  . Other malaise and fatigue 11/01/2012  . Left knee DJD     Past Surgical History  Procedure Laterality Date  . Left hand surgery       (Not in a hospital admission) No Known Allergies   Current Outpatient  Prescriptions on File Prior to Visit  Medication Sig Dispense Refill  . acetaminophen (TYLENOL) 325 MG tablet Take 650 mg by mouth as needed.      . cyanocobalamin 100 MCG tablet Take 100 mcg by mouth daily.      Marland Kitchen lisinopril (PRINIVIL,ZESTRIL) 10 MG tablet Take 1 tablet (10 mg total) by mouth daily.  30 tablet  6  . metFORMIN (GLUCOPHAGE) 500 MG tablet Take 500 mg by mouth daily with breakfast.      . rosuvastatin (CRESTOR) 20 MG tablet Take 1 tablet (20 mg total) by mouth daily.  30 tablet  6  . HYDROcodone-acetaminophen (NORCO/VICODIN) 5-325 MG per tablet Take 1 tablet by mouth every 6 (six) hours as needed for pain.  15 tablet  1  . ofloxacin (OCUFLOX) 0.3 % ophthalmic solution Place 1 drop into the left eye every 4 (four) hours.  5 mL  0   No current facility-administered medications on file prior to visit.     History  Substance Use Topics  . Smoking status: Current Every Day Smoker -- 2.00 packs/day for 15 years    Types: Cigarettes  . Smokeless tobacco: Not on file  . Alcohol Use: No    Family History  Problem Relation Age of Onset  . Alzheimer's disease Mother   . Hypertension Father      Review of Systems  Constitutional: Negative.   HENT: Negative.   Eyes: Negative.   Respiratory: Negative.   Cardiovascular: Negative.   Gastrointestinal: Negative.   Genitourinary: Negative.   Musculoskeletal: Positive for joint pain.       Left knee  Skin: Negative.   Neurological: Negative.   Endo/Heme/Allergies: Negative.  Psychiatric/Behavioral: Negative.     Objective:  Physical Exam  Constitutional: He is oriented to person, place, and time. He appears well-developed and well-nourished.  HENT:  Head: Normocephalic and atraumatic.  Mouth/Throat: Oropharynx is clear and moist.  Eyes: Conjunctivae and EOM are normal. Pupils are equal, round, and reactive to light.  Neck: Neck supple.  Cardiovascular: Normal rate, regular rhythm and normal heart sounds.   Respiratory:  Effort normal and breath sounds normal.  GI: Soft. Bowel sounds are normal.  Genitourinary:  Not pertinent to current symptomatology therefore not examined.  Musculoskeletal:  Examination of his left knee reveals pain on the medial joint line positive medial McMurray's mild varus deformity 1+ crepitation full range of motion knee is stable with normal patella tracking. Exam of the right knee reveals full range of motion without pain swelling weakness or instability. Vascular exam: pulses 2+ and symmetric.  Neurological: He is alert and oriented to person, place, and time.  Skin: Skin is warm and dry.  Psychiatric: He has a normal mood and affect. His behavior is normal.    Vital signs in last 24 hours: Last recorded: 09/04 1300   BP: 146/92 Pulse: 93  Temp: 98.2 F (36.8 C)    Height: 5\' 11"  (1.803 m) SpO2: 97  Weight: 104.781 kg (231 lb)     Labs:   Estimated body mass index is 32.23 kg/(m^2) as calculated from the following:   Height as of this encounter: 5\' 11"  (1.803 m).   Weight as of this encounter: 104.781 kg (231 lb).   Imaging Review Plain radiographs demonstrate moderate degenerative joint disease of the left knee(s). The overall alignment ismild varus. The bone quality appears to be good for age and reported activity level.  Assessment/Plan:  End stage arthritis, left knee   The patient history, physical examination, clinical judgment of the provider and imaging studies are consistent with end stage degenerative joint disease of the left knee(s) and unicompartment knee arthroplasty is deemed medically necessary. The treatment options including medical management, injection therapy arthroscopy and arthroplasty were discussed at length. The risks and benefits of unicompartment knee arthroplasty were presented and reviewed. The risks due to aseptic loosening, infection, stiffness, patella tracking problems, thromboembolic complications and other imponderables were  discussed. The patient acknowledged the explanation, agreed to proceed with the plan and consent was signed. Patient is being admitted for inpatient treatment for surgery, pain control, PT, OT, prophylactic antibiotics, VTE prophylaxis, progressive ambulation and ADL's and discharge planning. The patient is planning to be discharged home with home health services  Anitta Tenny A. Gwinda Passe Physician Assistant [facility] [phone]  03/26/2013, 2:28 PM

## 2013-03-30 ENCOUNTER — Encounter (HOSPITAL_COMMUNITY)
Admission: RE | Admit: 2013-03-30 | Discharge: 2013-03-30 | Disposition: A | Discharge status: Home / Self Care | Payer: BC Managed Care – PPO | Source: Ambulatory Visit | Attending: Orthopedic Surgery | Admitting: Orthopedic Surgery

## 2013-03-30 ENCOUNTER — Encounter (HOSPITAL_COMMUNITY): Payer: Self-pay

## 2013-03-30 ENCOUNTER — Encounter (HOSPITAL_COMMUNITY)
Admission: RE | Admit: 2013-03-30 | Discharge: 2013-03-30 | Disposition: A | Discharge status: Home / Self Care | Payer: BC Managed Care – PPO | Source: Ambulatory Visit | Attending: Physician Assistant | Admitting: Physician Assistant

## 2013-03-30 DIAGNOSIS — Z01812 Encounter for preprocedural laboratory examination: Secondary | ICD-10-CM | POA: Insufficient documentation

## 2013-03-30 DIAGNOSIS — Z01810 Encounter for preprocedural cardiovascular examination: Secondary | ICD-10-CM | POA: Insufficient documentation

## 2013-03-30 DIAGNOSIS — Z01818 Encounter for other preprocedural examination: Secondary | ICD-10-CM | POA: Insufficient documentation

## 2013-03-30 HISTORY — DX: Benign prostatic hyperplasia without lower urinary tract symptoms: N40.0

## 2013-03-30 HISTORY — DX: Depression, unspecified: F32.A

## 2013-03-30 HISTORY — DX: Major depressive disorder, single episode, unspecified: F32.9

## 2013-03-30 LAB — URINALYSIS, ROUTINE W REFLEX MICROSCOPIC
Hgb urine dipstick: NEGATIVE
Nitrite: NEGATIVE
Protein, ur: NEGATIVE mg/dL
Urobilinogen, UA: 0.2 mg/dL (ref 0.0–1.0)

## 2013-03-30 LAB — URINE MICROSCOPIC-ADD ON

## 2013-03-30 LAB — COMPREHENSIVE METABOLIC PANEL
ALT: 32 U/L (ref 0–53)
AST: 19 U/L (ref 0–37)
Albumin: 4.3 g/dL (ref 3.5–5.2)
Alkaline Phosphatase: 60 U/L (ref 39–117)
Calcium: 9.9 mg/dL (ref 8.4–10.5)
GFR calc Af Amer: >90 mL/min (ref >90)
Potassium: 4.8 mEq/L (ref 3.5–5.1)
Sodium: 138 mEq/L (ref 135–145)
Total Protein: 7.6 g/dL (ref 6.0–8.3)

## 2013-03-30 LAB — CBC WITH DIFFERENTIAL/PLATELET
Basophils Absolute: 0.1 10*3/uL (ref 0.0–0.1)
Basophils Relative: 1 % (ref 0–1)
Eosinophils Absolute: 0.1 10*3/uL (ref 0.0–0.7)
Eosinophils Relative: 1 % (ref 0–5)
MCH: 32.9 pg (ref 26.0–34.0)
MCV: 90.6 fL (ref 78.0–100.0)
Neutrophils Relative %: 63 % (ref 43–77)
Platelets: 227 10*3/uL (ref 150–400)
RBC: 4.87 MIL/uL (ref 4.22–5.81)
RDW: 12.8 % (ref 11.5–15.5)
WBC: 10.2 10*3/uL (ref 4.0–10.5)

## 2013-03-30 LAB — TYPE AND SCREEN
ABO/RH(D): A NEG
Antibody Screen: NEGATIVE

## 2013-03-30 LAB — SURGICAL PCR SCREEN
MRSA, PCR: NEGATIVE
Staphylococcus aureus: POSITIVE — AB

## 2013-03-30 NOTE — Progress Notes (Signed)
03/30/13 1125  OBSTRUCTIVE SLEEP APNEA  Have you ever been diagnosed with sleep apnea through a sleep study? No  Do you snore loudly (loud enough to be heard through closed doors)?  0  Do you often feel tired, fatigued, or sleepy during the daytime? 1  Has anyone observed you stop breathing during your sleep? 1  Do you have, or are you being treated for high blood pressure? 1  BMI more than 35 kg/m2? 0  Age over 61 years old? 1  Neck circumference greater than 40 cm/18 inches? 1  Gender: 1  Obstructive Sleep Apnea Score 6  Score 4 or greater  Results sent to PCP

## 2013-03-30 NOTE — Pre-Procedure Instructions (Signed)
WESTLY HINNANT  03/30/2013   Your procedure is scheduled on:  September 15  Report to [facility] Entrance "A" [address] at [employer] AM.  Call this number if you have problems the morning of surgery: [phone]   Remember:   Do not eat food or drink liquids after midnight.   Take these medicines the morning of surgery with A SIP OF WATER: Hydrocodone (if needed), Effexor   Do not take Aspirin, Aleve, Naproxen, Advil, Ibuprofen, Vitamin, Herbs, or Supplements starting today  Do not wear jewelry, make-up or nail polish.  Do not wear lotions, powders, or perfumes. You may wear deodorant.  Do not shave 48 hours prior to surgery. Men may shave face and neck.  Do not bring valuables to the hospital.  [facility] is not responsible                   for any belongings or valuables.  Contacts, dentures or bridgework may not be worn into surgery.  Leave suitcase in the car. After surgery it may be brought to your room.  For patients admitted to the hospital, checkout time is 11:00 AM the day of  discharge.   Special Instructions: Shower using CHG 2 nights before surgery and the night before surgery.  If you shower the day of surgery use CHG.  Use special wash - you have one bottle of CHG for all showers.  You should use approximately 1/3 of the bottle for each shower.   Please read over the following fact sheets that you were given: Pain Booklet, Coughing and Deep Breathing, Blood Transfusion Information, Total Joint Packet and Surgical Site Infection Prevention

## 2013-03-30 NOTE — Progress Notes (Signed)
Patient stated he had not been informed of significant risk of procedure among other items on consent that had not been address. Request to speak to MD prior to signing.

## 2013-04-01 LAB — URINE CULTURE

## 2013-04-05 MED ORDER — CEFAZOLIN SODIUM-DEXTROSE 2-3 GM-% IV SOLR
2.0000 g | INTRAVENOUS | Status: AC
  Administered 2013-04-06: 2 g via INTRAVENOUS
  Filled 2013-04-05: qty 50

## 2013-04-05 MED ORDER — TRANEXAMIC ACID 100 MG/ML IV SOLN
1000.0000 mg | INTRAVENOUS | Status: AC
  Administered 2013-04-06: 1000 mg via INTRAVENOUS
  Filled 2013-04-05: qty 10

## 2013-04-06 ENCOUNTER — Ambulatory Visit (HOSPITAL_COMMUNITY): Payer: BC Managed Care – PPO | Admitting: Certified Registered Nurse Anesthetist

## 2013-04-06 ENCOUNTER — Inpatient Hospital Stay (HOSPITAL_COMMUNITY)
Admission: RE | Admit: 2013-04-06 | Discharge: 2013-04-07 | DRG: 209 | Disposition: A | Discharge status: Home / Self Care | Payer: BC Managed Care – PPO | Source: Ambulatory Visit | Attending: Orthopedic Surgery | Admitting: Orthopedic Surgery

## 2013-04-06 ENCOUNTER — Encounter (HOSPITAL_COMMUNITY): Payer: Self-pay | Admitting: Certified Registered Nurse Anesthetist

## 2013-04-06 ENCOUNTER — Encounter (HOSPITAL_COMMUNITY): Payer: Self-pay | Admitting: *Deleted

## 2013-04-06 ENCOUNTER — Encounter (HOSPITAL_COMMUNITY)
Admission: RE | Disposition: A | Discharge status: Home / Self Care | Payer: Self-pay | Source: Ambulatory Visit | Attending: Orthopedic Surgery

## 2013-04-06 DIAGNOSIS — G43909 Migraine, unspecified, not intractable, without status migrainosus: Secondary | ICD-10-CM | POA: Diagnosis present

## 2013-04-06 DIAGNOSIS — E785 Hyperlipidemia, unspecified: Secondary | ICD-10-CM | POA: Diagnosis present

## 2013-04-06 DIAGNOSIS — Z7982 Long term (current) use of aspirin: Secondary | ICD-10-CM

## 2013-04-06 DIAGNOSIS — Z820 Family history of epilepsy and other diseases of the nervous system: Secondary | ICD-10-CM

## 2013-04-06 DIAGNOSIS — Z8249 Family history of ischemic heart disease and other diseases of the circulatory system: Secondary | ICD-10-CM

## 2013-04-06 DIAGNOSIS — F329 Major depressive disorder, single episode, unspecified: Secondary | ICD-10-CM | POA: Diagnosis present

## 2013-04-06 DIAGNOSIS — F3289 Other specified depressive episodes: Secondary | ICD-10-CM | POA: Diagnosis present

## 2013-04-06 DIAGNOSIS — F17200 Nicotine dependence, unspecified, uncomplicated: Secondary | ICD-10-CM | POA: Diagnosis present

## 2013-04-06 DIAGNOSIS — E119 Type 2 diabetes mellitus without complications: Secondary | ICD-10-CM | POA: Diagnosis present

## 2013-04-06 DIAGNOSIS — I10 Essential (primary) hypertension: Secondary | ICD-10-CM | POA: Diagnosis present

## 2013-04-06 DIAGNOSIS — M1712 Unilateral primary osteoarthritis, left knee: Secondary | ICD-10-CM

## 2013-04-06 DIAGNOSIS — N400 Enlarged prostate without lower urinary tract symptoms: Secondary | ICD-10-CM | POA: Diagnosis present

## 2013-04-06 DIAGNOSIS — M1710 Unilateral primary osteoarthritis, unspecified knee: Principal | ICD-10-CM | POA: Diagnosis present

## 2013-04-06 DIAGNOSIS — E669 Obesity, unspecified: Secondary | ICD-10-CM | POA: Diagnosis present

## 2013-04-06 DIAGNOSIS — Z79899 Other long term (current) drug therapy: Secondary | ICD-10-CM

## 2013-04-06 HISTORY — PX: PARTIAL KNEE ARTHROPLASTY: SHX2174

## 2013-04-06 LAB — GLUCOSE, CAPILLARY
Glucose-Capillary: 130 mg/dL — ABNORMAL HIGH (ref 70–99)
Glucose-Capillary: 185 mg/dL — ABNORMAL HIGH (ref 70–99)
Glucose-Capillary: 165 mg/dL — ABNORMAL HIGH (ref 70–99)
Glucose-Capillary: 167 mg/dL — ABNORMAL HIGH (ref 70–99)

## 2013-04-06 SURGERY — ARTHROPLASTY, KNEE, UNICOMPARTMENTAL
Anesthesia: General | Site: Knee | Laterality: Left | Wound class: Clean

## 2013-04-06 MED ORDER — FENTANYL CITRATE 0.05 MG/ML IJ SOLN
INTRAMUSCULAR | Status: DC | PRN
  Administered 2013-04-06 (×4): 50 ug via INTRAVENOUS
  Administered 2013-04-06 (×2): 100 ug via INTRAVENOUS
  Administered 2013-04-06 (×2): 50 ug via INTRAVENOUS

## 2013-04-06 MED ORDER — ROCURONIUM BROMIDE 100 MG/10ML IV SOLN
INTRAVENOUS | Status: DC | PRN
  Administered 2013-04-06: 40 mg via INTRAVENOUS

## 2013-04-06 MED ORDER — PHENOL 1.4 % MT LIQD
1.0000 | OROMUCOSAL | Status: DC | PRN
  Administered 2013-04-06: 1 via OROMUCOSAL
  Filled 2013-04-06: qty 177

## 2013-04-06 MED ORDER — GLYCOPYRROLATE 0.2 MG/ML IJ SOLN
INTRAMUSCULAR | Status: DC | PRN
  Administered 2013-04-06: 0.6 mg via INTRAVENOUS

## 2013-04-06 MED ORDER — OXYCODONE HCL 5 MG PO TABS
5.0000 mg | ORAL_TABLET | ORAL | Status: DC | PRN
  Administered 2013-04-06 – 2013-04-07 (×3): 10 mg via ORAL
  Filled 2013-04-06: qty 2
  Filled 2013-04-06: qty 1
  Filled 2013-04-06 (×3): qty 2

## 2013-04-06 MED ORDER — MIDAZOLAM HCL 5 MG/5ML IJ SOLN
INTRAMUSCULAR | Status: DC | PRN
  Administered 2013-04-06: 2 mg via INTRAVENOUS

## 2013-04-06 MED ORDER — CELECOXIB 200 MG PO CAPS
200.0000 mg | ORAL_CAPSULE | Freq: Two times a day (BID) | ORAL | Status: DC
  Administered 2013-04-06 – 2013-04-07 (×3): 200 mg via ORAL
  Filled 2013-04-06 (×5): qty 1

## 2013-04-06 MED ORDER — OXYCODONE HCL 5 MG PO TABS
ORAL_TABLET | ORAL | Status: AC
  Filled 2013-04-06: qty 1

## 2013-04-06 MED ORDER — ZOLPIDEM TARTRATE 5 MG PO TABS: 5.0000 mg | ORAL_TABLET | Freq: Every evening | ORAL | Status: DC | PRN

## 2013-04-06 MED ORDER — DOCUSATE SODIUM 100 MG PO CAPS
100.0000 mg | ORAL_CAPSULE | Freq: Two times a day (BID) | ORAL | Status: DC
  Administered 2013-04-06 – 2013-04-07 (×3): 100 mg via ORAL
  Filled 2013-04-06 (×4): qty 1

## 2013-04-06 MED ORDER — ASPIRIN EC 325 MG PO TBEC
325.0000 mg | DELAYED_RELEASE_TABLET | Freq: Every day | ORAL | Status: DC
  Administered 2013-04-07: 325 mg via ORAL
  Filled 2013-04-06 (×2): qty 1

## 2013-04-06 MED ORDER — CHLORHEXIDINE GLUCONATE 4 % EX LIQD: 60.0000 mL | Freq: Once | CUTANEOUS | Status: DC

## 2013-04-06 MED ORDER — PROPOFOL 10 MG/ML IV BOLUS
INTRAVENOUS | Status: DC | PRN
  Administered 2013-04-06: 200 mg via INTRAVENOUS

## 2013-04-06 MED ORDER — LISINOPRIL 10 MG PO TABS
10.0000 mg | ORAL_TABLET | Freq: Every day | ORAL | Status: DC
  Administered 2013-04-06 – 2013-04-07 (×2): 10 mg via ORAL
  Filled 2013-04-06 (×2): qty 1

## 2013-04-06 MED ORDER — ROPIVACAINE HCL 5 MG/ML IJ SOLN
INTRAMUSCULAR | Status: DC | PRN
  Administered 2013-04-06: 25 mL via EPIDURAL

## 2013-04-06 MED ORDER — OXYCODONE HCL 5 MG/5ML PO SOLN: 5.0000 mg | Freq: Once | ORAL | Status: AC | PRN

## 2013-04-06 MED ORDER — METFORMIN HCL 500 MG PO TABS
500.0000 mg | ORAL_TABLET | Freq: Every day | ORAL | Status: DC
  Administered 2013-04-07: 500 mg via ORAL
  Filled 2013-04-06 (×2): qty 1

## 2013-04-06 MED ORDER — VENLAFAXINE HCL 75 MG PO TABS: 225.0000 mg | ORAL_TABLET | ORAL | Status: DC

## 2013-04-06 MED ORDER — LACTATED RINGERS IV SOLN
INTRAVENOUS | Status: DC | PRN
  Administered 2013-04-06 (×2): via INTRAVENOUS

## 2013-04-06 MED ORDER — METOCLOPRAMIDE HCL 10 MG PO TABS: 5.0000 mg | ORAL_TABLET | Freq: Three times a day (TID) | ORAL | Status: DC | PRN

## 2013-04-06 MED ORDER — MENTHOL 3 MG MT LOZG: 1.0000 | LOZENGE | OROMUCOSAL | Status: DC | PRN

## 2013-04-06 MED ORDER — PROMETHAZINE HCL 25 MG/ML IJ SOLN: 6.2500 mg | INTRAMUSCULAR | Status: DC | PRN

## 2013-04-06 MED ORDER — ONDANSETRON HCL 4 MG/2ML IJ SOLN
INTRAMUSCULAR | Status: DC | PRN
  Administered 2013-04-06: 4 mg via INTRAVENOUS

## 2013-04-06 MED ORDER — NEOSTIGMINE METHYLSULFATE 1 MG/ML IJ SOLN
INTRAMUSCULAR | Status: DC | PRN
  Administered 2013-04-06: 4 mg via INTRAVENOUS

## 2013-04-06 MED ORDER — POVIDONE-IODINE 7.5 % EX SOLN
Freq: Once | CUTANEOUS | Status: DC
  Filled 2013-04-06: qty 118

## 2013-04-06 MED ORDER — ACETAMINOPHEN 325 MG PO TABS: 650.0000 mg | ORAL_TABLET | Freq: Four times a day (QID) | ORAL | Status: DC | PRN

## 2013-04-06 MED ORDER — POTASSIUM CHLORIDE IN NACL 20-0.9 MEQ/L-% IV SOLN
INTRAVENOUS | Status: DC
  Administered 2013-04-06: 15:00:00 via INTRAVENOUS
  Filled 2013-04-06 (×4): qty 1000

## 2013-04-06 MED ORDER — DIPHENHYDRAMINE HCL 12.5 MG/5ML PO ELIX: 12.5000 mg | ORAL_SOLUTION | ORAL | Status: DC | PRN

## 2013-04-06 MED ORDER — OXYCODONE HCL 5 MG PO TABS
5.0000 mg | ORAL_TABLET | Freq: Once | ORAL | Status: AC | PRN
  Administered 2013-04-06: 5 mg via ORAL

## 2013-04-06 MED ORDER — ACETAMINOPHEN 650 MG RE SUPP: 650.0000 mg | Freq: Four times a day (QID) | RECTAL | Status: DC | PRN

## 2013-04-06 MED ORDER — LABETALOL HCL 5 MG/ML IV SOLN
INTRAVENOUS | Status: DC | PRN
  Administered 2013-04-06 (×2): 5 mg via INTRAVENOUS

## 2013-04-06 MED ORDER — 0.9 % SODIUM CHLORIDE (POUR BTL) OPTIME
TOPICAL | Status: DC | PRN
  Administered 2013-04-06: 1000 mL

## 2013-04-06 MED ORDER — VENLAFAXINE HCL 75 MG PO TABS
75.0000 mg | ORAL_TABLET | Freq: Three times a day (TID) | ORAL | Status: DC
  Administered 2013-04-06 – 2013-04-07 (×2): 75 mg via ORAL
  Filled 2013-04-06 (×5): qty 1

## 2013-04-06 MED ORDER — HYDROMORPHONE HCL PF 1 MG/ML IJ SOLN
0.5000 mg | INTRAMUSCULAR | Status: DC | PRN
  Administered 2013-04-06: 1 mg via INTRAVENOUS
  Filled 2013-04-06: qty 1

## 2013-04-06 MED ORDER — MIDAZOLAM HCL 2 MG/2ML IJ SOLN: 0.5000 mg | Freq: Once | INTRAMUSCULAR | Status: DC | PRN

## 2013-04-06 MED ORDER — CEFAZOLIN SODIUM-DEXTROSE 2-3 GM-% IV SOLR
2.0000 g | Freq: Four times a day (QID) | INTRAVENOUS | Status: AC
  Administered 2013-04-06 (×2): 2 g via INTRAVENOUS
  Filled 2013-04-06 (×3): qty 50

## 2013-04-06 MED ORDER — MEPERIDINE HCL 25 MG/ML IJ SOLN: 6.2500 mg | INTRAMUSCULAR | Status: DC | PRN

## 2013-04-06 MED ORDER — HYDROMORPHONE HCL PF 1 MG/ML IJ SOLN: 0.2500 mg | INTRAMUSCULAR | Status: DC | PRN

## 2013-04-06 MED ORDER — METOCLOPRAMIDE HCL 5 MG/ML IJ SOLN: 5.0000 mg | Freq: Three times a day (TID) | INTRAMUSCULAR | Status: DC | PRN

## 2013-04-06 MED ORDER — BUPIVACAINE-EPINEPHRINE 0.25% -1:200000 IJ SOLN
INTRAMUSCULAR | Status: DC | PRN
  Administered 2013-04-06: 30 mL

## 2013-04-06 MED ORDER — ONDANSETRON HCL 4 MG/2ML IJ SOLN: 4.0000 mg | Freq: Four times a day (QID) | INTRAMUSCULAR | Status: DC | PRN

## 2013-04-06 MED ORDER — ONDANSETRON HCL 4 MG PO TABS: 4.0000 mg | ORAL_TABLET | Freq: Four times a day (QID) | ORAL | Status: DC | PRN

## 2013-04-06 MED ORDER — DEXAMETHASONE SODIUM PHOSPHATE 4 MG/ML IJ SOLN
INTRAMUSCULAR | Status: DC | PRN
  Administered 2013-04-06: 10 mg via INTRAVENOUS

## 2013-04-06 MED ORDER — LACTATED RINGERS IV SOLN: INTRAVENOUS | Status: DC

## 2013-04-06 SURGICAL SUPPLY — 70 items
BANDAGE ELASTIC 4 VELCRO ST LF (GAUZE/BANDAGES/DRESSINGS) ×2 IMPLANT
BANDAGE ELASTIC 6 VELCRO ST LF (GAUZE/BANDAGES/DRESSINGS) ×2 IMPLANT
BANDAGE ESMARK 6X9 LF (GAUZE/BANDAGES/DRESSINGS) ×1 IMPLANT
BIT DRILL QUICK REL 1/8 2PK SL (DRILL) ×1 IMPLANT
BNDG ELASTIC 6X10 VLCR STRL LF (GAUZE/BANDAGES/DRESSINGS) ×2 IMPLANT
BNDG ESMARK 6X9 LF (GAUZE/BANDAGES/DRESSINGS) ×2
BOWL SMART MIX CTS (DISPOSABLE) ×2 IMPLANT
CAPT KNEE OXFORD ×2 IMPLANT
CEMENT HV SMART SET (Cement) ×4 IMPLANT
CLOTH BEACON ORANGE TIMEOUT ST (SAFETY) ×2 IMPLANT
COVER BACK TABLE 24X17X13 BIG (DRAPES) IMPLANT
COVER SURGICAL LIGHT HANDLE (MISCELLANEOUS) ×2 IMPLANT
CUFF TOURNIQUET SINGLE 34IN LL (TOURNIQUET CUFF) IMPLANT
CUFF TOURNIQUET SINGLE 44IN (TOURNIQUET CUFF) IMPLANT
DRAPE C-ARM MINI 42X72 WSTRAPS (DRAPES) ×2 IMPLANT
DRAPE EXTREMITY T 121X128X90 (DRAPE) ×2 IMPLANT
DRAPE INCISE IOBAN 66X45 STRL (DRAPES) ×2 IMPLANT
DRAPE PROXIMA HALF (DRAPES) ×2 IMPLANT
DRAPE U-SHAPE 47X51 STRL (DRAPES) ×2 IMPLANT
DRILL QUICK RELEASE 1/8 INCH (DRILL) ×1
DRSG ADAPTIC 3X8 NADH LF (GAUZE/BANDAGES/DRESSINGS) ×2 IMPLANT
DRSG PAD ABDOMINAL 8X10 ST (GAUZE/BANDAGES/DRESSINGS) ×2 IMPLANT
DURAPREP 26ML APPLICATOR (WOUND CARE) ×2 IMPLANT
ELECT CAUTERY BLADE 6.4 (BLADE) ×2 IMPLANT
ELECT REM PT RETURN 9FT ADLT (ELECTROSURGICAL) ×2
ELECTRODE REM PT RTRN 9FT ADLT (ELECTROSURGICAL) ×1 IMPLANT
EVACUATOR 1/8 PVC DRAIN (DRAIN) IMPLANT
FACESHIELD LNG OPTICON STERILE (SAFETY) ×2 IMPLANT
GLOVE BIO SURGEON STRL SZ7 (GLOVE) ×2 IMPLANT
GLOVE BIOGEL PI IND STRL 7.0 (GLOVE) ×1 IMPLANT
GLOVE BIOGEL PI IND STRL 7.5 (GLOVE) ×1 IMPLANT
GLOVE BIOGEL PI IND STRL 8 (GLOVE) ×1 IMPLANT
GLOVE BIOGEL PI INDICATOR 7.0 (GLOVE) ×1
GLOVE BIOGEL PI INDICATOR 7.5 (GLOVE) ×1
GLOVE BIOGEL PI INDICATOR 8 (GLOVE) ×1
GLOVE BIOGEL PI ORTHO PRO 7.5 (GLOVE) ×1
GLOVE PI ORTHO PRO STRL 7.5 (GLOVE) ×1 IMPLANT
GLOVE SS BIOGEL STRL SZ 7.5 (GLOVE) ×1 IMPLANT
GLOVE SUPERSENSE BIOGEL SZ 7.5 (GLOVE) ×1
GOWN PREVENTION PLUS XLARGE (GOWN DISPOSABLE) ×4 IMPLANT
GOWN STRL NON-REIN LRG LVL3 (GOWN DISPOSABLE) ×4 IMPLANT
GOWN STRL REIN XL XLG (GOWN DISPOSABLE) ×2 IMPLANT
HANDPIECE INTERPULSE COAX TIP (DISPOSABLE) ×1
HOOD PEEL AWAY FACE SHEILD DIS (HOOD) ×6 IMPLANT
IMMOBILIZER KNEE 22 UNIV (SOFTGOODS) ×2 IMPLANT
KIT BASIN OR (CUSTOM PROCEDURE TRAY) ×2 IMPLANT
KIT ROOM TURNOVER OR (KITS) ×2 IMPLANT
KIT SAW BLADE (KITS) ×2 IMPLANT
MANIFOLD NEPTUNE II (INSTRUMENTS) ×2 IMPLANT
NS IRRIG 1000ML POUR BTL (IV SOLUTION) ×2 IMPLANT
PACK TOTAL JOINT (CUSTOM PROCEDURE TRAY) ×2 IMPLANT
PAD ARMBOARD 7.5X6 YLW CONV (MISCELLANEOUS) ×4 IMPLANT
PAD CAST 4YDX4 CTTN HI CHSV (CAST SUPPLIES) ×1 IMPLANT
PADDING CAST COTTON 4X4 STRL (CAST SUPPLIES) ×1
PADDING CAST COTTON 6X4 STRL (CAST SUPPLIES) ×2 IMPLANT
RUBBERBAND STERILE (MISCELLANEOUS) ×2 IMPLANT
SET HNDPC FAN SPRY TIP SCT (DISPOSABLE) ×1 IMPLANT
SPONGE GAUZE 4X4 12PLY (GAUZE/BANDAGES/DRESSINGS) ×2 IMPLANT
STRIP CLOSURE SKIN 1/2X4 (GAUZE/BANDAGES/DRESSINGS) ×2 IMPLANT
SUCTION FRAZIER TIP 10 FR DISP (SUCTIONS) ×2 IMPLANT
SUT ETHIBOND NAB CT1 #1 30IN (SUTURE) ×4 IMPLANT
SUT MNCRL AB 3-0 PS2 18 (SUTURE) ×2 IMPLANT
SUT VIC AB 0 CT1 27 (SUTURE) ×2
SUT VIC AB 0 CT1 27XBRD ANBCTR (SUTURE) ×2 IMPLANT
SUT VIC AB 2-0 CT1 27 (SUTURE) ×2
SUT VIC AB 2-0 CT1 TAPERPNT 27 (SUTURE) ×2 IMPLANT
TOWEL OR 17X24 6PK STRL BLUE (TOWEL DISPOSABLE) ×2 IMPLANT
TOWEL OR 17X26 10 PK STRL BLUE (TOWEL DISPOSABLE) ×2 IMPLANT
TRAY FOLEY CATH 16FRSI W/METER (SET/KITS/TRAYS/PACK) IMPLANT
WATER STERILE IRR 1000ML POUR (IV SOLUTION) ×6 IMPLANT

## 2013-04-06 NOTE — Op Note (Signed)
NAMEDEMITRIOS, Nathan Hess          ACCOUNT NO.:  [id number]  MEDICAL RECORD NO.:  [id number]  LOCATION:  5N24C                        FACILITY:  [facility]  PHYSICIAN:  Elana Alm. Thurston Hess, M.D. DATE OF BIRTH:  1951-06-04  DATE OF PROCEDURE:  04/06/2013 DATE OF DISCHARGE:                              OPERATIVE REPORT   PREOPERATIVE DIAGNOSIS:  Left knee medial compartment degenerative joint disease.  POSTOPERATIVE DIAGNOSIS:  Left knee medial compartment degenerative joint disease.  PROCEDURE:  Left knee unicompartmental arthroplasty using Oxford cemented unicompartmental system with size D cemented tibial component with a medium cemented femoral component with a #3 poly spacer.  SURGEON:  Elana Alm. Thurston Hess, M.D.  ASSISTANT:  Eulas Post, MD and Domingo Cocking, [place]  ANESTHESIA:  General.  OPERATIVE TIME:  1 hour and 45 minutes.  COMPLICATIONS:  None.  DESCRIPTION OF PROCEDURE:  Nathan Hess was brought to the operating room on April 06, 2013, after femoral nerve block was placed in the holding room by Anesthesia.  He was placed on the operative table in supine position.  He received antibiotics preoperatively for prophylaxis.  After being placed under general anesthesia, he had a Foley catheter placed under sterile conditions.  His left knee was examined.  Range of motion from 0-130 degrees, mild varus deformity, knee stable ligamentous exam with normal patellar tracking.  Left leg was prepped using sterile DuraPrep and draped using sterile technique. Time-out procedure was called and the correct left knee identified. Left leg was exsanguinated and a thigh tourniquet was elevated at 365 mm.  Initially, through a 6-cm longitudinal incision based just medial to the patellar tendon, initial exposure was made.  The underlying subcutaneous tissues were incised along with skin incision.  A median arthrotomy was carried out revealing the underlying medial compartment. He was  found to have grade 3 and 4 DJD in the medial femoral condyle and medial tibial plateau.  ACL and PCL were normal.  Lateral compartment articular cartilage normal.  Patellofemoral joint articular cartilage normal and the patella tracked normally.  At this point using a medium- sizing spoon, which fit satisfactorily, the proximal tibial cutting jig was attached to this and then the proximal tibial cut was made.  After this was done, then a #3 Shim was placed and found to be gave excellent stability.  At this point, intramedullary drill was drilled up the femoral canal and the intramedullary rod was placed and linked to the femoral cutting jig and sizer, which was placed in the middle aspect of the femoral condyle and the two drill holes made through this after it was linked to the intramedullary femoral rod.  This was then removed and the 0 Spigot was placed and then initial femoral milling was carried out.  At this point, then with the size D tibial baseplate, which gave an excellent fit and the median femoral component trial, this was placed.  Initially, the size 3 in flexion gave an excellent fit, but there was no gap in extension and this, #3 Spigot was used to mill the femur.  After this was done, the trial components were replaced and the #3 spacer was used and found to give excellent stability, excellent correction of his varus  deformity.  At this point, then the tibial keel cut was made using the tibial guide and the femoral impingement guide was placed and then these cuts were made as well.  After this was done, then the trial components were replaced back into position and with the #3 poly trial, the knee was brought through a full range of motion and found to be stable with excellent excursion and excellent tracking with the poly component, excellent correction of his flexion and varus deformities.  At this point, it was felt that all the trial components were of excellent size,  fit, and stability.  They were then removed. The knee was then jet lavaged, irrigated with 3 liters of saline. Proximal tibia was then exposed and the size D tibial component with cement backing was hammered in position with an excellent fit with excess cement being removed from around the edges.  The medium femoral component with cement backing was hammered in position also with an excellent fit, with excess cement being removed from around the edges. The #3 poly insert was then placed with excellent fit, excellent stability and excursion through a full range of motion.  The wound was further irrigated with saline and then after the cement hardened, full range of motion was noted with excellent stability and excellent correction of his flexion and varus deformities and normal polyethylene tibial tracking.  At this point, it was felt that all the components were of excellent size, fit, and stability.  The wound was then closed using interrupted #2 Ethibond and running #2 Ethibond suture over medium Hemovac drains.  Subcutaneous tissues were closed with 0 and 2-0 Vicryl, subcuticular layer was closed with 4-0 Monocryl.  Sterile dressings were applied and a long-leg splint and the tourniquet was released and then the patient awakened, extubated, and taken to the recovery room in stable condition.  Needle and sponge counts were correct x2 at the end of the case.     Nathan Hess, M.D.     RAW/MEDQ  D:  04/06/2013  T:  04/06/2013  Job:  [id number]

## 2013-04-06 NOTE — Brief Op Note (Signed)
04/06/2013  9:55 AM  PATIENT:  Nathan Hess  62 y.o. male  PRE-OPERATIVE DIAGNOSIS:  Degenerative Joint Disease  LEFT KNEE  POST-OPERATIVE DIAGNOSIS:  Degenerative Joint Disease  LEFT KNEE  PROCEDURE:  Procedure(s): UNICOMPARTMENTAL LEFT KNEE (Left)  SURGEON:  Surgeon(s) and Role:    * Nilda Simmer, MD - Primary  PHYSICIAN ASSISTANT: Precious Bard PA-C  ASSISTANTS: Teryl Lucy MD   Precious Bard PA-C   ANESTHESIA:   general  EBL:  Total I/O In: 1400 [I.V.:1400] Out: 725 [Urine:700; Blood:25]  BLOOD ADMINISTERED:none  DRAINS: (1) Hemovact drain(s) in the left knee with  Suction Clamped   LOCAL MEDICATIONS USED:  NONE  SPECIMEN:  No Specimen  DISPOSITION OF SPECIMEN:  N/A  COUNTS:  YES  TOURNIQUET:  * Missing tourniquet times found for documented tourniquets in log:  [id number] *  DICTATION: .Note written in EPIC  PLAN OF CARE: Admit to inpatient   PATIENT DISPOSITION:  PACU - hemodynamically stable.   Delay start of Pharmacological VTE agent (>24hrs) due to surgical blood loss or risk of bleeding: no

## 2013-04-06 NOTE — Progress Notes (Signed)
Orthopedic Tech Progress Note Patient Details:  Nathan Hess Apr 05, 1951 [id number]  CPM Left Knee CPM Left Knee: On Left Knee Flexion (Degrees): 60 Left Knee Extension (Degrees): 0 Additional Comments: Trapeze bar , foot roll   Shawnie Pons 04/06/2013, 11:52 AM

## 2013-04-06 NOTE — Anesthesia Preprocedure Evaluation (Signed)
Anesthesia Evaluation  Patient identified by MRN, date of birth, ID band Patient awake    Reviewed: Allergy & Precautions, H&P , NPO status , Patient's Chart, lab work & pertinent test results  History of Anesthesia Complications Negative for: history of anesthetic complications  Airway       Dental  (+) Teeth Intact and Dental Advisory Given   Pulmonary Current Smoker,  breath sounds clear to auscultation  Pulmonary exam normal       Cardiovascular hypertension, Pt. on medications Rhythm:Regular Rate:Normal     Neuro/Psych negative neurological ROS     GI/Hepatic negative GI ROS, Neg liver ROS,   Endo/Other  diabetes (glu 130), Well Controlled, Type 2, Oral Hypoglycemic Agents  Renal/GU negative Renal ROS     Musculoskeletal   Abdominal (+) + obese,   Peds  Hematology   Anesthesia Other Findings   Reproductive/Obstetrics                           Anesthesia Physical Anesthesia Plan  ASA: II  Anesthesia Plan: General   Post-op Pain Management:    Induction: Intravenous  Airway Management Planned: Oral ETT  Additional Equipment:   Intra-op Plan:   Post-operative Plan: Extubation in OR  Informed Consent: I have reviewed the patients History and Physical, chart, labs and discussed the procedure including the risks, benefits and alternatives for the proposed anesthesia with the patient or authorized representative who has indicated his/her understanding and acceptance.   Dental advisory given  Plan Discussed with: CRNA and Surgeon  Anesthesia Plan Comments: (Plan routine monitors, GETA with adductor canal block for post op analgesia)        Anesthesia Quick Evaluation

## 2013-04-06 NOTE — Anesthesia Postprocedure Evaluation (Signed)
  Anesthesia Post-op Note  Patient: Nathan Hess  Procedure(s) Performed: Procedure(s): UNICOMPARTMENTAL LEFT KNEE (Left)  Patient Location: PACU  Anesthesia Type:GA combined with regional for post-op pain  Level of Consciousness: awake, alert , oriented and patient cooperative  Airway and Oxygen Therapy: Patient Spontanous Breathing and Patient connected to nasal cannula oxygen  Post-op Pain: none  Post-op Assessment: Post-op Vital signs reviewed, Patient's Cardiovascular Status Stable, Respiratory Function Stable, Patent Airway, No signs of Nausea or vomiting and Pain level controlled  Post-op Vital Signs: Reviewed and stable  Complications: No apparent anesthesia complications

## 2013-04-06 NOTE — Transfer of Care (Signed)
Immediate Anesthesia Transfer of Care Note  Patient: Nathan Hess  Procedure(s) Performed: Procedure(s): UNICOMPARTMENTAL LEFT KNEE (Left)  Patient Location: PACU  Anesthesia Type:General  Level of Consciousness: sedated  Airway & Oxygen Therapy: Patient Spontanous Breathing and Patient connected to face mask oxygen  Post-op Assessment: Report given to PACU RN and Post -op Vital signs reviewed and stable  Post vital signs: Reviewed and stable  Complications: No apparent anesthesia complications

## 2013-04-06 NOTE — Evaluation (Signed)
Physical Therapy Evaluation Patient Details Name: Nathan Hess MRN: [id number] DOB: [birth year] Today's Date: 04/06/2013 Time: 4098-1191 PT Time Calculation (min): 31 min  PT Assessment / Plan / Recommendation History of Present Illness  Patient is a 62 yo male s/p Lt unicompartment knee replacement.  Clinical Impression  Patient presents with problems listed below.  Will benefit from acute PT to maximize independence prior to discharge.  Patient to have f/u OP PT (arranged per patient).    PT Assessment  Patient needs continued PT services    Follow Up Recommendations  Outpatient PT;Supervision/Assistance - 24 hour    Does the patient have the potential to tolerate intense rehabilitation      Barriers to Discharge        Equipment Recommendations  Rolling walker with 5" wheels;3in1 (PT)    Recommendations for Other Services     Frequency 7X/week    Precautions / Restrictions Precautions Precautions: Knee Precaution Booklet Issued: Yes (comment) Precaution Comments: Reviewed precautions with patient and wife Required Braces or Orthoses: Knee Immobilizer - Left Knee Immobilizer - Left: On when out of bed or walking;Discontinue post op day 2 Restrictions Weight Bearing Restrictions: Yes LLE Weight Bearing: Weight bearing as tolerated   Pertinent Vitals/Pain       Mobility  Bed Mobility Bed Mobility: Not assessed (Patient in chair as PT entered room) Transfers Transfers: Sit to Stand;Stand to Sit Sit to Stand: 4: Min guard;With upper extremity assist;With armrests;From chair/3-in-1 Stand to Sit: 4: Min guard;With upper extremity assist;With armrests;To chair/3-in-1 Details for Transfer Assistance: Verbal cues for safe hand placement during transfers.  Assist for safety only. Ambulation/Gait Ambulation/Gait Assistance: 4: Min guard Ambulation Distance (Feet): 108 Feet Assistive device: Rolling walker Ambulation/Gait Assistance Details: Verbal cues for safe  use of RW and for gait sequence.  Cues to look up during gait and stand upright. Gait Pattern: Step-to pattern;Decreased stance time - left;Decreased step length - right;Antalgic;Trunk flexed    Exercises Total Joint Exercises Ankle Circles/Pumps: AROM;Both;10 reps;Seated   PT Diagnosis: Difficulty walking;Acute pain  PT Problem List: Decreased strength;Decreased range of motion;Decreased activity tolerance;Decreased balance;Decreased mobility;Decreased knowledge of use of DME;Decreased knowledge of precautions;Pain PT Treatment Interventions: DME instruction;Gait training;Stair training;Functional mobility training;Therapeutic exercise;Patient/family education     PT Goals(Current goals can be found in the care plan section) Acute Rehab PT Goals Patient Stated Goal: To go home tomorrow PT Goal Formulation: With patient/family Time For Goal Achievement: 04/13/13 Potential to Achieve Goals: Good  Visit Information  Last PT Received On: 04/06/13 Assistance Needed: +1 History of Present Illness: Patient is a 62 yo male s/p Lt unicompartment knee replacement.       Prior Functioning  Home Living Family/patient expects to be discharged to:: Private residence Living Arrangements: Spouse/significant other Available Help at Discharge: Family;Available 24 hours/day Type of Home: House Home Access: Stairs to enter [employer] of Steps: 3 Entrance Stairs-Rails: Right;Left Home Layout: One level Home Equipment: Cane - single point;Crutches Prior Function Level of Independence: Independent Communication Communication: No difficulties    Cognition  Cognition Arousal/Alertness: Awake/alert Behavior During Therapy: WFL for tasks assessed/performed Overall Cognitive Status: Within Functional Limits for tasks assessed    Extremity/Trunk Assessment Upper Extremity Assessment Upper Extremity Assessment: Overall WFL for tasks assessed Lower Extremity Assessment Lower Extremity  Assessment: LLE deficits/detail LLE Deficits / Details: Decreased strength and ROM due to surgery/pain. LLE: Unable to fully assess due to pain Cervical / Trunk Assessment Cervical / Trunk Assessment: Normal   Balance  End of Session PT - End of Session Equipment Utilized During Treatment: Gait belt;Left knee immobilizer Activity Tolerance: Patient tolerated treatment well Patient left: in chair;with call bell/phone within reach;with family/visitor present Nurse Communication: Mobility status CPM Left Knee CPM Left Knee: Off  GP     Vena Austria 04/06/2013, 6:49 PM Durenda Hurt. Renaldo Fiddler, [facility] Acute Rehab Services Pager [phone]

## 2013-04-06 NOTE — Anesthesia Procedure Notes (Addendum)
Procedure Name: Intubation Date/Time: 04/06/2013 7:48 AM Performed by: Orvilla Fus A Pre-anesthesia Checklist: Patient identified, Timeout performed, Suction available, Emergency Drugs available and Patient being monitored Patient Re-evaluated:Patient Re-evaluated prior to inductionOxygen Delivery Method: Circle system utilized Preoxygenation: Pre-oxygenation with 100% oxygen Intubation Type: IV induction Ventilation: Oral airway inserted - appropriate to patient size, Two handed mask ventilation required and Mask ventilation with difficulty Laryngoscope Size: Mac and 4 Grade View: Grade III Tube size: 7.5 mm Number of attempts: 2 (DLx1 CRNA grade III, unable to pass ETT. DL x1 MDA successful with bougie) Airway Equipment and Method: Bougie stylet Placement Confirmation: ETT inserted through vocal cords under direct vision,  breath sounds checked- equal and bilateral and positive ETCO2 Secured at: 21 cm Tube secured with: Tape Dental Injury: Teeth and Oropharynx as per pre-operative assessment    Anesthesia Regional Block:  Adductor canal block  Pre-Anesthetic Checklist: ,, timeout performed, Correct Patient, Correct Site, Correct Laterality, Correct Procedure, Correct Position, site marked, Risks and benefits discussed,  Surgical consent,  Pre-op evaluation,  At surgeon's request and post-op pain management  Laterality: Left  Prep: chloraprep       Needles:  Injection technique: Single-shot  Needle Type: Echogenic Needle      Needle Gauge: 22 and 22 G    Additional Needles:  Procedures: ultrasound guided (picture in chart) Adductor canal block Narrative:  Start time: 04/06/2013 7:15 AM End time: 04/06/2013 7:25 AM Injection made incrementally with aspirations every 5 mL.  Performed by: Personally  Anesthesiologist: Sandford Craze, MD  Additional Notes: Pt identified in Holding room.  Monitors applied. Working IV access confirmed. Sterile prep.  #[id number] Echogenic needle  positioned adjacent to the femoral artery.  25cc 0.5% Ropivacaine injected incrementally after negative test dose.  Patient asymptomatic, VSS, no heme aspirated, tolerated well.  Sandford Craze, MD  Adductor canal block

## 2013-04-06 NOTE — H&P (View-Only) (Signed)
TOTAL KNEE ADMISSION H&P  Patient is being admitted for left unicompartmental arthroplasty.  Subjective:  Chief Complaint:left knee pain.  HPI: Nathan Hess, 62 y.o. male, has a history of pain and functional disability in the left knee due to arthritis and has failed non-surgical conservative treatments for greater than 12 weeks to includeNSAID's and/or analgesics, corticosteriod injections, viscosupplementation injections, flexibility and strengthening excercises, use of assistive devices, weight reduction as appropriate and activity modification.  Onset of symptoms was gradual, starting 10 years ago with gradually worsening course since that time. The patient noted prior procedures on the knee to include  arthroscopy and menisectomy on the left knee(s).  Patient currently rates pain in the left knee(s) at 2 out of 10 with activity. Patient has night pain, worsening of pain with activity and weight bearing, pain that interferes with activities of daily living, crepitus and joint swelling.  Patient has evidence of subchondral sclerosis, periarticular osteophytes and joint space narrowing by imaging studies.There is no active infection.  Patient Active Problem List   Diagnosis Date Noted  . Left knee DJD   . Other malaise and fatigue 11/01/2012  . Tobacco use disorder 10/30/2012  . Migraine headache 08/03/2012  . Diabetes mellitus type 2 in obese 05/25/2012  . Hyperlipidemia 05/25/2012  . Unspecified essential hypertension 05/25/2012   Past Medical History  Diagnosis Date  . Migraine headache 08/03/2012  . Unspecified essential hypertension 05/25/2012  . Diabetes mellitus type 2 in obese 05/25/2012  . Hyperlipidemia 05/25/2012  . Tobacco use disorder 10/30/2012  . Other malaise and fatigue 11/01/2012  . Left knee DJD     Past Surgical History  Procedure Laterality Date  . Left hand surgery       (Not in a hospital admission) No Known Allergies   Current Outpatient  Prescriptions on File Prior to Visit  Medication Sig Dispense Refill  . acetaminophen (TYLENOL) 325 MG tablet Take 650 mg by mouth as needed.      . cyanocobalamin 100 MCG tablet Take 100 mcg by mouth daily.      . lisinopril (PRINIVIL,ZESTRIL) 10 MG tablet Take 1 tablet (10 mg total) by mouth daily.  30 tablet  6  . metFORMIN (GLUCOPHAGE) 500 MG tablet Take 500 mg by mouth daily with breakfast.      . rosuvastatin (CRESTOR) 20 MG tablet Take 1 tablet (20 mg total) by mouth daily.  30 tablet  6  . HYDROcodone-acetaminophen (NORCO/VICODIN) 5-325 MG per tablet Take 1 tablet by mouth every 6 (six) hours as needed for pain.  15 tablet  1  . ofloxacin (OCUFLOX) 0.3 % ophthalmic solution Place 1 drop into the left eye every 4 (four) hours.  5 mL  0   No current facility-administered medications on file prior to visit.     History  Substance Use Topics  . Smoking status: Current Every Day Smoker -- 2.00 packs/day for 15 years    Types: Cigarettes  . Smokeless tobacco: Not on file  . Alcohol Use: No    Family History  Problem Relation Age of Onset  . Alzheimer's disease Mother   . Hypertension Father      Review of Systems  Constitutional: Negative.   HENT: Negative.   Eyes: Negative.   Respiratory: Negative.   Cardiovascular: Negative.   Gastrointestinal: Negative.   Genitourinary: Negative.   Musculoskeletal: Positive for joint pain.       Left knee  Skin: Negative.   Neurological: Negative.   Endo/Heme/Allergies: Negative.     Psychiatric/Behavioral: Negative.     Objective:  Physical Exam  Constitutional: He is oriented to person, place, and time. He appears well-developed and well-nourished.  HENT:  Head: Normocephalic and atraumatic.  Mouth/Throat: Oropharynx is clear and moist.  Eyes: Conjunctivae and EOM are normal. Pupils are equal, round, and reactive to light.  Neck: Neck supple.  Cardiovascular: Normal rate, regular rhythm and normal heart sounds.   Respiratory:  Effort normal and breath sounds normal.  GI: Soft. Bowel sounds are normal.  Genitourinary:  Not pertinent to current symptomatology therefore not examined.  Musculoskeletal:  Examination of his left knee reveals pain on the medial joint line positive medial McMurray's mild varus deformity 1+ crepitation full range of motion knee is stable with normal patella tracking. Exam of the right knee reveals full range of motion without pain swelling weakness or instability. Vascular exam: pulses 2+ and symmetric.  Neurological: He is alert and oriented to person, place, and time.  Skin: Skin is warm and dry.  Psychiatric: He has a normal mood and affect. His behavior is normal.    Vital signs in last 24 hours: Last recorded: 09/04 1300   BP: 146/92 Pulse: 93  Temp: 98.2 F (36.8 C)    Height: 5' 11" (1.803 m) SpO2: 97  Weight: 104.781 kg (231 lb)     Labs:   Estimated body mass index is 32.23 kg/(m^2) as calculated from the following:   Height as of this encounter: 5' 11" (1.803 m).   Weight as of this encounter: 104.781 kg (231 lb).   Imaging Review Plain radiographs demonstrate moderate degenerative joint disease of the left knee(s). The overall alignment ismild varus. The bone quality appears to be good for age and reported activity level.  Assessment/Plan:  End stage arthritis, left knee   The patient history, physical examination, clinical judgment of the provider and imaging studies are consistent with end stage degenerative joint disease of the left knee(s) and unicompartment knee arthroplasty is deemed medically necessary. The treatment options including medical management, injection therapy arthroscopy and arthroplasty were discussed at length. The risks and benefits of unicompartment knee arthroplasty were presented and reviewed. The risks due to aseptic loosening, infection, stiffness, patella tracking problems, thromboembolic complications and other imponderables were  discussed. The patient acknowledged the explanation, agreed to proceed with the plan and consent was signed. Patient is being admitted for inpatient treatment for surgery, pain control, PT, OT, prophylactic antibiotics, VTE prophylaxis, progressive ambulation and ADL's and discharge planning. The patient is planning to be discharged home with home health services  Franck Vinal A. Brelynn Wheller, PA-C Physician Assistant [facility] [phone]  03/26/2013, 2:28 PM  

## 2013-04-06 NOTE — Progress Notes (Signed)
UR COMPLETED  

## 2013-04-06 NOTE — Interval H&P Note (Signed)
History and Physical Interval Note:  04/06/2013 7:05 AM  Nathan Hess  has presented today for surgery, with the diagnosis of DJD LEFT KNEE  The various methods of treatment have been discussed with the patient and family. After consideration of risks, benefits and other options for treatment, the patient has consented to  Procedure(s): UNICOMPARTMENTAL LEFT KNEE (Left) as a surgical intervention .  The patient's history has been reviewed, patient examined, no change in status, stable for surgery.  I have reviewed the patient's chart and labs.  Questions were answered to the patient's satisfaction.     Salvatore Marvel A

## 2013-04-07 ENCOUNTER — Encounter (HOSPITAL_COMMUNITY): Payer: Self-pay | Admitting: Orthopedic Surgery

## 2013-04-07 LAB — CBC
HCT: 39.4 % (ref 39.0–52.0)
MCH: 32.9 pg (ref 26.0–34.0)
MCHC: 36 g/dL (ref 30.0–36.0)
MCV: 91.4 fL (ref 78.0–100.0)
Platelets: 203 10*3/uL (ref 150–400)
RDW: 12.6 % (ref 11.5–15.5)
WBC: 17.6 10*3/uL — ABNORMAL HIGH (ref 4.0–10.5)

## 2013-04-07 LAB — BASIC METABOLIC PANEL
BUN: 14 mg/dL (ref 6–23)
Calcium: 9.1 mg/dL (ref 8.4–10.5)
Creatinine, Ser: 0.76 mg/dL (ref 0.50–1.35)
GFR calc Af Amer: >90 mL/min (ref >90)
GFR calc non Af Amer: >90 mL/min (ref >90)

## 2013-04-07 MED ORDER — BACLOFEN 10 MG PO TABS: 10.0000 mg | ORAL_TABLET | Freq: Three times a day (TID) | ORAL | Status: DC

## 2013-04-07 MED ORDER — OXYCODONE-ACETAMINOPHEN 10-325 MG PO TABS: 1.0000 | ORAL_TABLET | ORAL | Status: DC | PRN

## 2013-04-07 MED ORDER — ASPIRIN 325 MG PO TABS: 325.0000 mg | ORAL_TABLET | Freq: Every day | ORAL | Status: DC

## 2013-04-07 NOTE — Evaluation (Signed)
Occupational Therapy Evaluation Patient Details Name: Nathan Hess MRN: [id number] DOB: January 02, 1951 Today's Date: 04/07/2013 Time: 0940-1001 OT Time Calculation (min): 21 min  OT Assessment / Plan / Recommendation History of present illness Patient is a 62 yo male s/p Lt unicompartment knee replacement.   Clinical Impression   Pt doing well and is at set up/sup level with UB AD"s, min A LB ADLs and sup with ADL mobility. Pt will have 24 hr assist at home from his wife. All education completed and no further acute OT services indicated    OT Assessment  Patient does not need any further OT services    Follow Up Recommendations  No OT follow up    Barriers to Discharge      Equipment Recommendations  3 in 1 bedside comode    Recommendations for Other Services    Frequency       Precautions / Restrictions Precautions Precautions: Knee Knee Immobilizer - Left: On when out of bed or walking;Discontinue post op day 2 Restrictions Weight Bearing Restrictions: No LLE Weight Bearing: Weight bearing as tolerated   Pertinent Vitals/Pain 2/10    ADL  Grooming: Performed;Wash/dry hands;Wash/dry face;Supervision/safety Where Assessed - Grooming: Supported standing Upper Body Bathing: Simulated;Set up;Supervision/safety Lower Body Bathing: Simulated;Minimal assistance Upper Body Dressing: Performed;Set up;Supervision/safety Lower Body Dressing: Performed;Minimal assistance Toilet Transfer: Performed;Supervision/safety Toilet Transfer Method: Sit to stand Toilet Transfer Equipment: Regular height toilet;Grab bars;Raised toilet seat with arms (or 3-in-1 over toilet) Toileting - Clothing Manipulation and Hygiene: Performed;Supervision/safety Where Assessed - Glass blower/designer Manipulation and Hygiene: Standing Tub/Shower Transfer Method: Not assessed Equipment Used: Rolling walker;Sock aid;Long-handled shoe horn;Long-handled sponge;Reacher Transfers/Ambulation Related to ADLs:  cues for safety ADL Comments: pt provided with education and demo of ADL A/E, able to return demo    OT Diagnosis:    OT Problem List:   OT Treatment Interventions:     OT Goals(Current goals can be found in the care plan section) Acute Rehab OT Goals Patient Stated Goal: to go home today  Visit Information  Last OT Received On: 04/07/13 Assistance Needed: +1 History of Present Illness: Patient is a 62 yo male s/p Lt unicompartment knee replacement.       Prior Functioning     Home Living Family/patient expects to be discharged to:: Private residence Living Arrangements: Spouse/significant other Available Help at Discharge: Family;Available 24 hours/day Type of Home: House Home Access: Stairs to enter [employer] of Steps: 3 Entrance Stairs-Rails: Right;Left Home Layout: One level Home Equipment: Cane - single point;Crutches Prior Function Level of Independence: Independent Communication Communication: No difficulties Dominant Hand: Right         Vision/Perception Vision - History Baseline Vision: Wears glasses all the time Patient Visual Report: No change from baseline Perception Perception: Within Functional Limits   Cognition  Cognition Arousal/Alertness: Awake/alert Behavior During Therapy: WFL for tasks assessed/performed Overall Cognitive Status: Within Functional Limits for tasks assessed    Extremity/Trunk Assessment Upper Extremity Assessment Upper Extremity Assessment: Overall WFL for tasks assessed     Mobility Bed Mobility Bed Mobility: Not assessed Details for Bed Mobility Assistance: Pt up in recliner upon entering room Transfers Transfers: Sit to Stand;Stand to Sit Sit to Stand: 5: Supervision;From chair/3-in-1;From toilet Stand to Sit: 5: Supervision;To chair/3-in-1;To toilet Details for Transfer Assistance: Supervision for safety with cues for hand placement        Balance Balance Balance Assessed: Yes Dynamic Standing  Balance Dynamic Standing - Balance Support: No upper extremity supported;During functional activity Dynamic  Standing - Level of Assistance: 5: Stand by assistance   End of Session OT - End of Session Equipment Utilized During Treatment: Rolling walker;Other (comment) (3 in 1, ADL A/E) Activity Tolerance: Patient tolerated treatment well Patient left: Other (comment) (ambulating in hall with PT) CPM Left Knee CPM Left Knee: Off  GO     Margaretmary Eddy [facility] 04/07/2013, 12:19 PM

## 2013-04-07 NOTE — Progress Notes (Signed)
Physical Therapy Treatment Patient Details Name: Nathan Hess MRN: [id number] DOB: 1950-09-02 Today's Date: 04/07/2013 Time: 7829-5621 PT Time Calculation (min): 34 min  PT Assessment / Plan / Recommendation  History of Present Illness Patient is a 62 yo male s/p Lt unicompartment knee replacement.   PT Comments   Pt moving well and able to perform stair negotiation.  Pt ready to d/c home per PT standpoint.    Follow Up Recommendations  Outpatient PT;Supervision/Assistance - 24 hour     Equipment Recommendations  Rolling walker with 5" wheels;3in1 (PT)    Frequency 7X/week   Progress towards PT Goals Progress towards PT goals: Progressing toward goals  Plan Current plan remains appropriate    Precautions / Restrictions Precautions Precautions: Knee Restrictions Weight Bearing Restrictions: No LLE Weight Bearing: Weight bearing as tolerated   Pertinent Vitals/Pain 2/10 left knee pain    Mobility  Bed Mobility Bed Mobility: Not assessed Transfers Transfers: Sit to Stand;Stand to Sit Sit to Stand: 5: Supervision;From chair/3-in-1 Stand to Sit: 5: Supervision;To chair/3-in-1 Details for Transfer Assistance: Supervision for safety with cues for hand placement Ambulation/Gait Ambulation/Gait Assistance: 4: Min guard Ambulation Distance (Feet): 300 Feet Assistive device: Rolling walker Ambulation/Gait Assistance Details: Minguard for safety with cues for RW placement at times. Gait Pattern: Step-through pattern;Decreased stride length;Trunk flexed Stairs: Yes Stairs Assistance: 4: Min guard Stair Management Technique: Two rails;Forwards Number of Stairs: 3    Exercises Total Joint Exercises Ankle Circles/Pumps: AROM;Both;10 reps;Seated Quad Sets: Strengthening;Left;5 reps Short Arc Quad: Strengthening;Left;5 reps Heel Slides: Strengthening;AAROM;Left;5 reps Hip ABduction/ADduction: Strengthening;Left;5 reps Straight Leg Raises: Strengthening;Left;5  reps Goniometric ROM: 5-65   PT Diagnosis:    PT Problem List:   PT Treatment Interventions:     PT Goals (current goals can now be found in the care plan section) Acute Rehab PT Goals Patient Stated Goal: to go home today PT Goal Formulation: With patient/family Time For Goal Achievement: 04/13/13 Potential to Achieve Goals: Good  Visit Information  Last PT Received On: 04/07/13 Assistance Needed: +1 History of Present Illness: Patient is a 62 yo male s/p Lt unicompartment knee replacement.    Subjective Data  Subjective: "Can I go home today?" Patient Stated Goal: to go home today   Cognition  Cognition Arousal/Alertness: Awake/alert Behavior During Therapy: WFL for tasks assessed/performed Overall Cognitive Status: Within Functional Limits for tasks assessed    Balance     End of Session PT - End of Session Equipment Utilized During Treatment: Gait belt Activity Tolerance: Patient tolerated treatment well Patient left: in chair;with call bell/phone within reach;with family/visitor present Nurse Communication: Mobility status CPM Left Knee CPM Left Knee: Off   GP     [name] 04/07/2013, 10:56 AM  [name], PT DPT [phone]

## 2013-04-07 NOTE — Progress Notes (Signed)
Patient provided with discharge instructions and follow up appointment information. He was educated on dressing changes and signs/symptoms of infection. He is going home with outpatient PT for followup. His wife will be support at home.

## 2013-04-07 NOTE — Discharge Summary (Signed)
PATIENT ID: TARIG ZIMMERS        MRN:  [id number]          DOB/AGE: [birth year] / 62 y.o.    DISCHARGE SUMMARY  ADMISSION DATE:    04/06/2013 DISCHARGE DATE:   04/07/2013   ADMISSION DIAGNOSIS: DJD LEFT KNEE    DISCHARGE DIAGNOSIS:  Degenerative Joint Disease  LEFT KNEE    ADDITIONAL DIAGNOSIS: Active Problems:   * No active hospital problems. *  Past Medical History  Diagnosis Date  . Migraine headache 08/03/2012  . Diabetes mellitus type 2 in obese 05/25/2012  . Hyperlipidemia 05/25/2012  . Tobacco use disorder 10/30/2012  . Other malaise and fatigue 11/01/2012  . Left knee DJD   . Unspecified essential hypertension 05/25/2012    Does not see a cardiologist  . Depression   . Enlarged prostate     PROCEDURE: Procedure(s): UNICOMPARTMENTAL LEFT KNEE Left on 04/06/2013  CONSULTS: PT, OT, Care Management  HISTORY: See H&P in chart  HOSPITAL COURSE:  ROBBEN JAGIELLO is a 62 y.o. admitted on 04/06/2013 and found to have a diagnosis of Degenerative Joint Disease  LEFT KNEE.  After appropriate laboratory studies were obtained  they were taken to the operating room on 04/06/2013 and underwent  Procedure(s): UNICOMPARTMENTAL LEFT KNEE  Left.   They were given perioperative antibiotics:  Anti-infectives   Start     Dose/Rate Route Frequency Ordered Stop   04/06/13 1345  ceFAZolin (ANCEF) IVPB 2 g/50 mL premix     2 g 100 mL/hr over 30 Minutes Intravenous Every 6 hours 04/06/13 1331 04/06/13 1928   04/06/13 0600  ceFAZolin (ANCEF) IVPB 2 g/50 mL premix     2 g 100 mL/hr over 30 Minutes Intravenous On call to O.R. 04/05/13 1328 04/06/13 0749    .  Tolerated the procedure well.  Placed with a foley intraoperatively.      POD #1, allowed out of bed to a chair.  PT for ambulation and exercise program.  Foley D/C'd in morning.  IV saline locked.  O2 discontionued. Hemovac pulled.  The remainder of the hospital course was dedicated to ambulation and strengthening.   The  patient was discharged on 1 Day Post-Op in  Stable condition.  Blood products given:none  DIAGNOSTIC STUDIES: Recent vital signs: Patient Vitals for the past 24 hrs:  BP Temp Temp src Pulse Resp SpO2  04/07/13 0523 122/77 mmHg 98.1 F (36.7 C) Oral 103 16 95 %  04/07/13 0221 130/60 mmHg 98.8 F (37.1 C) Oral 90 16 95 %  04/06/13 2125 126/81 mmHg 98.3 F (36.8 C) Oral 105 18 93 %  04/06/13 1815 138/76 mmHg 97.9 F (36.6 C) - 86 20 95 %  04/06/13 1600 - - - - 18 94 %  04/06/13 1441 138/78 mmHg 97.9 F (36.6 C) - 92 20 94 %  04/06/13 1233 136/85 mmHg 97.9 F (36.6 C) - 85 20 94 %  04/06/13 1200 148/91 mmHg - - 86 14 94 %  04/06/13 1145 144/89 mmHg - - 87 21 93 %  04/06/13 1136 139/92 mmHg - - 86 12 93 %  04/06/13 1130 - - - 84 12 92 %  04/06/13 1121 153/91 mmHg - - 84 16 92 %  04/06/13 1115 - - - 86 17 90 %  04/06/13 1109 - - - 82 12 89 %  04/06/13 1108 157/92 mmHg - - 80 9 89 %  04/06/13 1106 169/83 mmHg - - 83 22 93 %  04/06/13 1100 - - - - - 91 %  04/06/13 1051 124/84 mmHg - - 81 16 89 %  04/06/13 1045 - - - - - 89 %  04/06/13 1036 138/88 mmHg - - 73 10 -  04/06/13 1030 - - - - - 88 %  04/06/13 1025 138/88 mmHg 98 F (36.7 C) - 72 11 72 %  04/06/13 1024 - - - - - 65 %       Recent laboratory studies:  Recent Labs  04/07/13 0537  WBC 17.6*  HGB 14.2  HCT 39.4  PLT 203    Recent Labs  04/07/13 0537  NA 139  K 3.8  CL 101  CO2 28  BUN 14  CREATININE 0.76  GLUCOSE 131*  CALCIUM 9.1   Lab Results  Component Value Date   INR 1.00 03/30/2013     Recent Radiographic Studies :  Dg Chest 2 View  03/30/2013   *RADIOLOGY REPORT*  Clinical Data: Hypertension, tobacco use  CHEST - 2 VIEW  Comparison: None.  Findings: The heart and pulmonary vascularity are within normal limits.  The lungs are clear bilaterally.  Degenerative changes of the thoracic spine are noted.  IMPRESSION: No acute abnormality is seen.   Original Report Authenticated By: Alcide Clever, M.D.     DISCHARGE INSTRUCTIONS: Discharge Orders   Future Orders Complete By Expires   Call MD / Call 911  As directed    Comments:     If you experience chest pain or shortness of breath, CALL 911 and be transported to the hospital emergency room.  If you develope a fever above 101 F, pus (white drainage) or increased drainage or redness at the wound, or calf pain, call your surgeon's office.   Change dressing  As directed    Comments:     change the dressing daily with sterile 4 x 4 inch gauze dressing and apply TED hose.  You may clean the incision with alcohol prior to redressing.   Constipation Prevention  As directed    Comments:     Drink plenty of fluids.  Prune juice may be helpful.  You may use a stool softener, such as Colace (over the counter) 100 mg twice a day.  Use MiraLax (over the counter) for constipation as needed.   CPM  As directed    Comments:     Continuous passive motion machine (CPM):      Use the CPM from 0 to 90 for 6 hours per day.           Use CPM for 2 weeks or until you are told to stop.   Diet - low sodium heart healthy  As directed    Discharge instructions  As directed    Comments:     Diet: You may resume  the diet you were on prior to surgery  Activity: Continue physical therapy protocol. Increase activity slowly as tolerated. No lifting until further notice.   Driving: No driving until further notice.  CPM: Continuous passive motion machine (CPM):       Use the CPM from 0 to 70 degrees for 6-8 hours per day.       You may increase by 10 degrees per day as tolerated.  You may break it up into  2 or 3 sessions per day.       Use CPM for 3-4 weeks or until you are told to stop.  TED hose: Use stockings (TED hose) for 3-4  weeks on both leg(s).  You may remove them at night for sleeping.  Shower:  May shower without a dressing once there is no drainage from your wound.                 Do NOT wash over the wound.  If drainage remains, cover wound with  saran                  Wrap and then shower.  Clean incision with betadine and change dressing                        After saran wrap removed.  Do not apply any creams or ointments to incision.   Dressing:  You may change your dressing on Wednesday                    Then change the dressing daily with sterile 4"x4"s gauze dressing                     And TED hose for knees.  Use paper tape to hold dressing in place                     For hips.  You may clean the incision with alcohol prior to redressing.  Weight Bearing:  weight bearing as taught in physical therapy.  Use a walker or                    Crutches as instructed.  To prevent constipation: you may use a stool softener such as -               Colace ( over the counter) 100 mg by mouth twice a day                Drink plenty of fluids ( prune juice may be helpful) and high fiber foods                Miralax ( over the counter) for constipation as needed.    Medications: Scripts for percocet, Aspirin, baclofen provided or called in to pharmacy  Precautions:  SEEK MEDICAL CARE IF:  You have swelling of your calf or leg.   You develop shortness of breath or chest pain.   You have redness, swelling, or increasing pain in the wound.   There is pus or any unusual drainage coming from the surgical site.   You notice a bad smell coming from the surgical site or dressing.   The surgical site breaks open after sutures or staples have been removed.   There is persistent bleeding from the suture or staple line.   You are getting worse or are not improving.   You have any other questions or concerns.  SEEK IMMEDIATE MEDICAL CARE IF:   You develop chest pain  You have a fever.   You develop a rash.   You have difficulty breathing.   You develop any reaction or side effects to medicines given.   Your knee motion is decreasing rather than improving.  MAKE SURE YOU:   Understand these instructions.   Will watch your condition.    Will get help right away if you are not doing well or get worse.   Do not put a pillow under the knee. Place it under the heel.  As directed    Increase activity slowly as tolerated  As  directed    Patient may shower  As directed    Comments:     You may shower without a dressing once there is no drainage x 2 days. If drainage remains, cover wound with plastic wrap and then shower.   TED hose  As directed    Comments:     Use stockings (TED hose) for 2 weeks on both leg(s).  You may remove them at night for sleeping.   Weight bearing as tolerated  As directed       DISCHARGE MEDICATIONS:     Medication List    STOP taking these medications       acetaminophen 325 MG tablet  Commonly known as:  TYLENOL     rosuvastatin 20 MG tablet  Commonly known as:  CRESTOR      TAKE these medications       aspirin 325 MG tablet  Take 1 tablet (325 mg total) by mouth daily.     baclofen 10 MG tablet  Commonly known as:  LIORESAL  Take 1 tablet (10 mg total) by mouth 3 (three) times daily.     lisinopril 10 MG tablet  Commonly known as:  PRINIVIL,ZESTRIL  Take 1 tablet (10 mg total) by mouth daily.     metFORMIN 500 MG tablet  Commonly known as:  GLUCOPHAGE  Take 500 mg by mouth daily with breakfast.     oxyCODONE-acetaminophen 10-325 MG per tablet  Commonly known as:  PERCOCET  Take 1 tablet by mouth every 4 (four) hours as needed for pain.     venlafaxine 75 MG tablet  Commonly known as:  EFFEXOR  Take 75 mg by mouth 3 (three) times daily.        FOLLOW UP VISIT:       Follow-up Information   Follow up with Nilda Simmer, MD. (1 week as scheduled)    Specialty:  Orthopedic Surgery   Contact information:   [address] [place] [address] [phone]       DISPOSITION:   Home  CONDITION:  Stable   Wilkie Aye 04/07/2013, 7:48 AM

## 2013-04-07 NOTE — Progress Notes (Addendum)
Patient ID: Nathan Hess, male   DOB: 1951-06-19, 62 y.o.   MRN: [id number]  PROGRESS NOTE  Subjective:  negative for Chest Pain  negative for Shortness of Breath  negative for Nausea/Vomiting   negative for Calf Pain  negative for Bowel Movement   Tolerating Diet: yes         Patient reports pain as 1 on 0-10 scale.     Pt doing well. Minimal pain only with activity. States he was able to work well with physical therapy.  Objective: Vital signs in last 24 hours:   Patient Vitals for the past 24 hrs:  BP Temp Temp src Pulse Resp SpO2  04/07/13 0523 122/77 mmHg 98.1 F (36.7 C) Oral 103 16 95 %  04/07/13 0221 130/60 mmHg 98.8 F (37.1 C) Oral 90 16 95 %  04/06/13 2125 126/81 mmHg 98.3 F (36.8 C) Oral 105 18 93 %  04/06/13 1815 138/76 mmHg 97.9 F (36.6 C) - 86 20 95 %  04/06/13 1600 - - - - 18 94 %  04/06/13 1441 138/78 mmHg 97.9 F (36.6 C) - 92 20 94 %  04/06/13 1233 136/85 mmHg 97.9 F (36.6 C) - 85 20 94 %  04/06/13 1200 148/91 mmHg - - 86 14 94 %  04/06/13 1145 144/89 mmHg - - 87 21 93 %  04/06/13 1136 139/92 mmHg - - 86 12 93 %  04/06/13 1130 - - - 84 12 92 %  04/06/13 1121 153/91 mmHg - - 84 16 92 %  04/06/13 1115 - - - 86 17 90 %  04/06/13 1109 - - - 82 12 89 %  04/06/13 1108 157/92 mmHg - - 80 9 89 %  04/06/13 1106 169/83 mmHg - - 83 22 93 %  04/06/13 1100 - - - - - 91 %  04/06/13 1051 124/84 mmHg - - 81 16 89 %  04/06/13 1045 - - - - - 89 %  04/06/13 1036 138/88 mmHg - - 73 10 -  04/06/13 1030 - - - - - 88 %  04/06/13 1025 138/88 mmHg 98 F (36.7 C) - 72 11 72 %  04/06/13 1024 - - - - - 65 %      Intake/Output from previous day:   09/15 0701 - 09/16 0700 In: 2120 [P.O.:720; I.V.:1400] Out: 4700 [Urine:4425; Drains:250]   Intake/Output this shift:       Intake/Output     09/15 0701 - 09/16 0700 09/16 0701 - 09/17 0700   P.O. 720    I.V. 1400    Total Intake 2120     Urine 4425    Drains 250    Blood 25    Total Output 4700     Net  -2580             LABORATORY DATA:  Recent Labs  04/07/13 0537  WBC 17.6*  HGB 14.2  HCT 39.4  PLT 203    Recent Labs  04/07/13 0537  NA 139  K 3.8  CL 101  CO2 28  BUN 14  CREATININE 0.76  GLUCOSE 131*  CALCIUM 9.1   Lab Results  Component Value Date   INR 1.00 03/30/2013    Examination:  General appearance: alert, cooperative and no distress  Wound Exam: clean, dry, intact   Drainage:  None: wound tissue dry  Motor Exam: grossly intact bilateral LE  Sensory Exam: grossly intact bilateral LE  Vascular Exam: Normal  Assessment:  1 Day Post-Op  Procedure(s) (LRB): UNICOMPARTMENTAL LEFT KNEE (Left)  ADDITIONAL DIAGNOSIS:  Active Problems:   * No active hospital problems. *     Plan: Physical Therapy as ordered Weight Bearing as Tolerated (WBAT)  DVT Prophylaxis:  Aspirin  DISCHARGE PLAN: Home this afternoon and will begin outpatient physical therapy.  DISCHARGE NEEDS: CPM, DME rec per PT/OT         Leighton Luster JAMES 04/07/2013, 7:35 AM  Addendum- Pt's WBC count at 17.6. No fevers or chills. Wound is benign with no signs of infection. Pt advised to contact office if he should develop any fevers or chills or purulent drainage from incision. Pt will f/u in one weeks time. Consider repeat labs if pt has any signs or symptoms of infection.

## 2013-05-12 ENCOUNTER — Telehealth: Payer: Self-pay | Admitting: Family Medicine

## 2013-05-12 NOTE — Telephone Encounter (Signed)
Spoke with pt. Symptoms have been occurring for a few days. Thought his aching was coming from knee replacement that he had about 5 weeks ago. Appt scheduled for tomorrow with PA at 8am. Pt has not travelled outside of the country in the last 30 days.

## 2013-05-12 NOTE — Telephone Encounter (Signed)
Patient aches all over  Is weak.  Has a cough.   I have no where to put him today.  Is there a slot I can use

## 2013-05-13 ENCOUNTER — Ambulatory Visit (INDEPENDENT_AMBULATORY_CARE_PROVIDER_SITE_OTHER): Payer: BC Managed Care – PPO | Admitting: Physician Assistant

## 2013-05-13 ENCOUNTER — Encounter: Payer: Self-pay | Admitting: Physician Assistant

## 2013-05-13 VITALS — BP 140/88 | HR 84 | Temp 98.4°F | Resp 20 | Ht 71.0 in | Wt 220.5 lb

## 2013-05-13 DIAGNOSIS — J069 Acute upper respiratory infection, unspecified: Secondary | ICD-10-CM

## 2013-05-13 DIAGNOSIS — Z23 Encounter for immunization: Secondary | ICD-10-CM

## 2013-05-13 HISTORY — DX: Encounter for immunization: Z23

## 2013-05-13 MED ORDER — HYDROCOD POLST-CHLORPHEN POLST 10-8 MG/5ML PO LQCR: 5.0000 mL | Freq: Two times a day (BID) | ORAL | Status: DC | PRN

## 2013-05-13 NOTE — Assessment & Plan Note (Signed)
Rx Tussionex.   Increase fluids.  Saline nasal spray.  Rest.  Humidifier.  Avoid smoking.  Return to clinic if symptoms persist or worsen.

## 2013-05-13 NOTE — Patient Instructions (Signed)
Please increase fluid intake.  Take cough medicine every 12 hours if needed for cough.  Humidifier in bedroom.  Daily probiotic.  Please call if symptoms persist or acutely worsen.

## 2013-05-13 NOTE — Progress Notes (Signed)
Patient ID: Nathan Hess, male   DOB: 05-05-1951, 62 y.o.   MRN: [id number]  Patient presents to clinic today c/o nonproductive cough and nasal congestion x 3 days.  Denies fever, chills, malaise, rash, N/V/D, recent sick contact.  Patient does endorse mild knee pain but states that is always present in his left knee.  Denies history of asthma or allergy.  Patient is a smoker, but states he stopped as soon as cough started.  Denies chest pain, shortness of breath or wheezing.   Past Medical History  Diagnosis Date  . Migraine headache 08/03/2012  . Diabetes mellitus type 2 in obese 05/25/2012  . Hyperlipidemia 05/25/2012  . Tobacco use disorder 10/30/2012  . Other malaise and fatigue 11/01/2012  . Left knee DJD   . Unspecified essential hypertension 05/25/2012    Does not see a cardiologist  . Depression   . Enlarged prostate     Current Outpatient Prescriptions on File Prior to Visit  Medication Sig Dispense Refill  . aspirin 325 MG tablet Take 1 tablet (325 mg total) by mouth daily.  30 tablet  0  . lisinopril (PRINIVIL,ZESTRIL) 10 MG tablet Take 1 tablet (10 mg total) by mouth daily.  30 tablet  6  . metFORMIN (GLUCOPHAGE) 500 MG tablet Take 500 mg by mouth daily with breakfast.      . venlafaxine (EFFEXOR) 75 MG tablet Take 225 mg by mouth daily.       . baclofen (LIORESAL) 10 MG tablet Take 1 tablet (10 mg total) by mouth 3 (three) times daily.  90 each  0   No current facility-administered medications on file prior to visit.    No Known Allergies  Family History  Problem Relation Age of Onset  . Alzheimer's disease Mother   . Hypertension Father     History   Social History  . Marital Status: Married    Spouse Name: N/A    Number of Children: N/A  . Years of Education: N/A   Social History Main Topics  . Smoking status: Current Every Day Smoker -- 2.00 packs/day for 15 years    Types: Cigarettes  . Smokeless tobacco: None  . Alcohol Use: Yes     Comment: 1  beer about every 2 months   . Drug Use: No  . Sexual Activity: Yes   Other Topics Concern  . None   Social History Narrative  . None   ROS See HPI.  All other ROS are negative.   Filed Vitals:   05/13/13 0817  BP: 140/88  Pulse: 84  Temp: 98.4 F (36.9 C)  Resp: 20   Physical Exam  Vitals reviewed. Constitutional: He is oriented to person, place, and time and well-developed, well-nourished, and in no distress.  HENT:  Head: Normocephalic and atraumatic.  Right Ear: External ear normal.  Left Ear: External ear normal.  Nose: Nose normal.  Mouth/Throat: Oropharynx is clear and moist.  TM WNL bilaterally.  Eyes: Conjunctivae are normal.  Neck: Neck supple.  Cardiovascular: Normal rate, regular rhythm and normal heart sounds.   Pulmonary/Chest: Effort normal and breath sounds normal. No respiratory distress. He has no wheezes. He has no rales. He exhibits no tenderness.  Lymphadenopathy:    He has no cervical adenopathy.  Neurological: He is alert and oriented to person, place, and time.  Skin: Skin is warm and dry. No rash noted.  Psychiatric: Affect normal.   Recent Results (from the past 2160 hour(s))  SURGICAL PCR SCREEN  Status: Abnormal   Collection Time    03/30/13 11:54 AM      Result Value Range   MRSA, PCR NEGATIVE  NEGATIVE   Staphylococcus aureus POSITIVE (*) NEGATIVE   Comment:            The Xpert SA Assay (FDA     approved for NASAL specimens     in patients over 44 years of age),     is one component of     a comprehensive surveillance     program.  Test performance has     been validated by [facility] [employer] for patients greater     than or equal to 28 year old.     It is not intended     to diagnose infection nor to     guide or monitor treatment.  URINALYSIS, ROUTINE W REFLEX MICROSCOPIC     Status: Abnormal   Collection Time    03/30/13 11:54 AM      Result Value Range   Color, Urine YELLOW  YELLOW   APPearance CLEAR  CLEAR    Specific Gravity, Urine 1.020  1.005 - 1.030   pH 6.0  5.0 - 8.0   Glucose, UA NEGATIVE  NEGATIVE mg/dL   Hgb urine dipstick NEGATIVE  NEGATIVE   Bilirubin Urine NEGATIVE  NEGATIVE   Ketones, ur NEGATIVE  NEGATIVE mg/dL   Protein, ur NEGATIVE  NEGATIVE mg/dL   Urobilinogen, UA 0.2  0.0 - 1.0 mg/dL   Nitrite NEGATIVE  NEGATIVE   Leukocytes, UA TRACE (*) NEGATIVE  URINE CULTURE     Status: None   Collection Time    03/30/13 11:54 AM      Result Value Range   Specimen Description URINE, CLEAN CATCH     Special Requests none     Culture  Setup Time       Value: 03/30/2013 12:20     Performed at [facility] Count       Value: NO GROWTH     Performed at [facility]   Culture       Value: NO GROWTH     Performed at [facility]   Report Status 04/01/2013 FINAL    URINE MICROSCOPIC-ADD ON     Status: Abnormal   Collection Time    03/30/13 11:54 AM      Result Value Range   Squamous Epithelial / LPF FEW (*) RARE   WBC, UA 0-2  <3 WBC/hpf   RBC / HPF 0-2  <3 RBC/hpf   Bacteria, UA RARE  RARE  TYPE AND SCREEN     Status: None   Collection Time    03/30/13 11:58 AM      Result Value Range   ABO/RH(D) A NEG     Antibody Screen NEG     Sample Expiration 04/13/2013    ABO/RH     Status: None   Collection Time    03/30/13 11:58 AM      Result Value Range   ABO/RH(D) A NEG    APTT     Status: None   Collection Time    03/30/13 11:59 AM      Result Value Range   aPTT 27  24 - 37 seconds  CBC WITH DIFFERENTIAL     Status: Abnormal   Collection Time    03/30/13 11:59 AM      Result Value Range   WBC 10.2  4.0 - 10.5 K/uL   RBC 4.87  4.22 - 5.81 MIL/uL   Hemoglobin 16.0  13.0 - 17.0 g/dL   HCT 16.1  09.6 - 04.5 %   MCV 90.6  78.0 - 100.0 fL   MCH 32.9  26.0 - 34.0 pg   MCHC 36.3 (*) 30.0 - 36.0 g/dL   RDW 40.9  81.1 - 91.4 %   Platelets 227  150 - 400 K/uL   Neutrophils Relative % 63  43 - 77 %   Neutro Abs 6.4  1.7 - 7.7 K/uL    Lymphocytes Relative 28  12 - 46 %   Lymphs Abs 2.9  0.7 - 4.0 K/uL   Monocytes Relative 8  3 - 12 %   Monocytes Absolute 0.8  0.1 - 1.0 K/uL   Eosinophils Relative 1  0 - 5 %   Eosinophils Absolute 0.1  0.0 - 0.7 K/uL   Basophils Relative 1  0 - 1 %   Basophils Absolute 0.1  0.0 - 0.1 K/uL  COMPREHENSIVE METABOLIC PANEL     Status: Abnormal   Collection Time    03/30/13 11:59 AM      Result Value Range   Sodium 138  135 - 145 mEq/L   Potassium 4.8  3.5 - 5.1 mEq/L   Chloride 102  96 - 112 mEq/L   CO2 29  19 - 32 mEq/L   Glucose, Bld 106 (*) 70 - 99 mg/dL   BUN 12  6 - 23 mg/dL   Creatinine, Ser 7.82  0.50 - 1.35 mg/dL   Calcium 9.9  8.4 - 95.6 mg/dL   Total Protein 7.6  6.0 - 8.3 g/dL   Albumin 4.3  3.5 - 5.2 g/dL   AST 19  0 - 37 U/L   ALT 32  0 - 53 U/L   Alkaline Phosphatase 60  39 - 117 U/L   Total Bilirubin 0.3  0.3 - 1.2 mg/dL   GFR calc non Af Amer >90  >90 mL/min   GFR calc Af Amer >90  >90 mL/min   Comment: (NOTE)     The eGFR has been calculated using the CKD EPI equation.     This calculation has not been validated in all clinical situations.     eGFR's persistently <90 mL/min signify possible Chronic Kidney     Disease.  PROTIME-INR     Status: None   Collection Time    03/30/13 11:59 AM      Result Value Range   Prothrombin Time 13.0  11.6 - 15.2 seconds   INR 1.00  0.00 - 1.49  GLUCOSE, CAPILLARY     Status: Abnormal   Collection Time    04/06/13  6:08 AM      Result Value Range   Glucose-Capillary 130 (*) 70 - 99 mg/dL  GLUCOSE, CAPILLARY     Status: Abnormal   Collection Time    04/06/13 10:27 AM      Result Value Range   Glucose-Capillary 185 (*) 70 - 99 mg/dL   Comment 1 Documented in Chart     Comment 2 Notify RN    GLUCOSE, CAPILLARY     Status: Abnormal   Collection Time    04/06/13  4:16 PM      Result Value Range   Glucose-Capillary 165 (*) 70 - 99 mg/dL   Comment 1 Documented in Chart     Comment 2 Notify RN    GLUCOSE, CAPILLARY  Status: Abnormal   Collection Time    04/06/13  9:33 PM      Result Value Range   Glucose-Capillary 167 (*) 70 - 99 mg/dL  CBC     Status: Abnormal   Collection Time    04/07/13  5:37 AM      Result Value Range   WBC 17.6 (*) 4.0 - 10.5 K/uL   RBC 4.31  4.22 - 5.81 MIL/uL   Hemoglobin 14.2  13.0 - 17.0 g/dL   HCT 16.1  09.6 - 04.5 %   MCV 91.4  78.0 - 100.0 fL   MCH 32.9  26.0 - 34.0 pg   MCHC 36.0  30.0 - 36.0 g/dL   RDW 40.9  81.1 - 91.4 %   Platelets 203  150 - 400 K/uL  BASIC METABOLIC PANEL     Status: Abnormal   Collection Time    04/07/13  5:37 AM      Result Value Range   Sodium 139  135 - 145 mEq/L   Potassium 3.8  3.5 - 5.1 mEq/L   Chloride 101  96 - 112 mEq/L   CO2 28  19 - 32 mEq/L   Glucose, Bld 131 (*) 70 - 99 mg/dL   BUN 14  6 - 23 mg/dL   Creatinine, Ser 7.82  0.50 - 1.35 mg/dL   Calcium 9.1  8.4 - 95.6 mg/dL   GFR calc non Af Amer >90  >90 mL/min   GFR calc Af Amer >90  >90 mL/min   Comment: (NOTE)     The eGFR has been calculated using the CKD EPI equation.     This calculation has not been validated in all clinical situations.     eGFR's persistently <90 mL/min signify possible Chronic Kidney     Disease.  GLUCOSE, CAPILLARY     Status: Abnormal   Collection Time    04/07/13  6:40 AM      Result Value Range   Glucose-Capillary 147 (*) 70 - 99 mg/dL    Assessment/Plan: Viral URI with cough Rx Tussionex.   Increase fluids.  Saline nasal spray.  Rest.  Humidifier.  Avoid smoking.  Return to clinic if symptoms persist or worsen.  Need for prophylactic vaccination and inoculation against influenza Flu shot given by nursing staff.

## 2013-05-13 NOTE — Assessment & Plan Note (Signed)
Flu shot given by nursing staff. 

## 2013-06-04 ENCOUNTER — Other Ambulatory Visit: Payer: Self-pay | Admitting: Family Medicine

## 2013-06-04 NOTE — Telephone Encounter (Signed)
eScribe request for refill on Effexor Last filled - 04.10.14, #[id number] Last AEX - 10.22.14 Refill sent per [facility] refill protocol/SLS

## 2013-06-08 ENCOUNTER — Other Ambulatory Visit: Payer: Self-pay | Admitting: Internal Medicine

## 2013-06-08 NOTE — Telephone Encounter (Signed)
Rx request to pharmacy/SLS  

## 2013-09-23 ENCOUNTER — Other Ambulatory Visit: Payer: Self-pay | Admitting: Family Medicine

## 2014-01-11 ENCOUNTER — Other Ambulatory Visit: Payer: Self-pay | Admitting: Family Medicine

## 2014-01-13 NOTE — Telephone Encounter (Signed)
Approved 3 months but needs to know needs annual in lab to keep going

## 2014-01-13 NOTE — Telephone Encounter (Signed)
Refill request for venlafaxine Last filled by MD on- 09/24/2013 #[phone] [phone] Last Appt: 05/13/2014 Next Appt: none Please advise refill?

## 2014-03-10 ENCOUNTER — Telehealth: Payer: Self-pay

## 2014-03-10 NOTE — Telephone Encounter (Signed)
Diabetic bundle  I informed patient that we need him to schedule an appt with md at his earliest convenience. Dr Abner GreenspanBlyth has not seen patient since 10-30-12 and lipid and A1C have not been done since 07-2012  Pt states he will call back to schedule appt

## 2014-04-08 ENCOUNTER — Other Ambulatory Visit: Payer: Self-pay | Admitting: Family Medicine

## 2014-04-23 ENCOUNTER — Ambulatory Visit (INDEPENDENT_AMBULATORY_CARE_PROVIDER_SITE_OTHER): Payer: BC Managed Care – PPO | Admitting: Family Medicine

## 2014-04-23 ENCOUNTER — Encounter: Payer: Self-pay | Admitting: Family Medicine

## 2014-04-23 VITALS — BP 128/84 | HR 91 | Temp 97.9°F | Ht 71.0 in | Wt 231.2 lb

## 2014-04-23 DIAGNOSIS — F32A Depression, unspecified: Secondary | ICD-10-CM

## 2014-04-23 DIAGNOSIS — F17200 Nicotine dependence, unspecified, uncomplicated: Secondary | ICD-10-CM

## 2014-04-23 DIAGNOSIS — Z720 Tobacco use: Secondary | ICD-10-CM

## 2014-04-23 DIAGNOSIS — R5383 Other fatigue: Secondary | ICD-10-CM

## 2014-04-23 DIAGNOSIS — Z23 Encounter for immunization: Secondary | ICD-10-CM

## 2014-04-23 DIAGNOSIS — M545 Low back pain: Secondary | ICD-10-CM

## 2014-04-23 DIAGNOSIS — F329 Major depressive disorder, single episode, unspecified: Secondary | ICD-10-CM

## 2014-04-23 DIAGNOSIS — E785 Hyperlipidemia, unspecified: Secondary | ICD-10-CM

## 2014-04-23 DIAGNOSIS — B9789 Other viral agents as the cause of diseases classified elsewhere: Secondary | ICD-10-CM

## 2014-04-23 DIAGNOSIS — E1169 Type 2 diabetes mellitus with other specified complication: Secondary | ICD-10-CM

## 2014-04-23 DIAGNOSIS — J069 Acute upper respiratory infection, unspecified: Secondary | ICD-10-CM

## 2014-04-23 DIAGNOSIS — E119 Type 2 diabetes mellitus without complications: Secondary | ICD-10-CM

## 2014-04-23 DIAGNOSIS — I10 Essential (primary) hypertension: Secondary | ICD-10-CM

## 2014-04-23 DIAGNOSIS — E669 Obesity, unspecified: Secondary | ICD-10-CM

## 2014-04-23 LAB — BASIC METABOLIC PANEL WITH GFR
BUN: 13 mg/dL (ref 6–23)
CO2: 27 mEq/L (ref 19–32)
CREATININE: 0.9 mg/dL (ref 0.50–1.35)
Calcium: 9.2 mg/dL (ref 8.4–10.5)
Chloride: 103 mEq/L (ref 96–112)
GFR, Est Non African American: >89 mL/min
Glucose, Bld: 109 mg/dL — ABNORMAL HIGH (ref 70–99)
Potassium: 4.1 mEq/L (ref 3.5–5.3)
Sodium: 139 mEq/L (ref 135–145)

## 2014-04-23 LAB — CBC WITH DIFFERENTIAL/PLATELET
BASOS ABS: 0.1 10*3/uL (ref 0.0–0.1)
BASOS PCT: 1 % (ref 0–1)
Eosinophils Absolute: 0.1 10*3/uL (ref 0.0–0.7)
Eosinophils Relative: 1 % (ref 0–5)
HCT: 41.3 % (ref 39.0–52.0)
Hemoglobin: 15 g/dL (ref 13.0–17.0)
Lymphocytes Relative: 29 % (ref 12–46)
Lymphs Abs: 2.6 10*3/uL (ref 0.7–4.0)
MCH: 32.3 pg (ref 26.0–34.0)
MCHC: 36.3 g/dL — AB (ref 30.0–36.0)
MCV: 88.8 fL (ref 78.0–100.0)
Monocytes Absolute: 0.6 10*3/uL (ref 0.1–1.0)
Monocytes Relative: 7 % (ref 3–12)
NEUTROS PCT: 62 % (ref 43–77)
Neutro Abs: 5.5 10*3/uL (ref 1.7–7.7)
PLATELETS: 256 10*3/uL (ref 150–400)
RBC: 4.65 MIL/uL (ref 4.22–5.81)
RDW: 13 % (ref 11.5–15.5)
WBC: 8.8 10*3/uL (ref 4.0–10.5)

## 2014-04-23 LAB — HEPATIC FUNCTION PANEL
ALBUMIN: 4.6 g/dL (ref 3.5–5.2)
ALT: 42 U/L (ref 0–53)
AST: 22 U/L (ref 0–37)
Alkaline Phosphatase: 67 U/L (ref 39–117)
BILIRUBIN DIRECT: 0.1 mg/dL (ref 0.0–0.3)
Indirect Bilirubin: 0.5 mg/dL (ref 0.2–1.2)
Total Bilirubin: 0.6 mg/dL (ref 0.2–1.2)
Total Protein: 7.1 g/dL (ref 6.0–8.3)

## 2014-04-23 LAB — LIPID PANEL
CHOL/HDL RATIO: 5.2 ratio
CHOLESTEROL: 219 mg/dL — AB (ref 0–200)
HDL: 42 mg/dL (ref >39)
LDL Cholesterol: 150 mg/dL — ABNORMAL HIGH (ref 0–99)
Triglycerides: 134 mg/dL (ref <150)
VLDL: 27 mg/dL (ref 0–40)

## 2014-04-23 LAB — TSH: TSH: 1.766 u[IU]/mL (ref 0.350–4.500)

## 2014-04-23 MED ORDER — METFORMIN HCL 500 MG PO TABS: 500.0000 mg | ORAL_TABLET | Freq: Every day | ORAL | Status: DC

## 2014-04-23 MED ORDER — VARENICLINE TARTRATE 1 MG PO TABS: 1.0000 mg | ORAL_TABLET | Freq: Two times a day (BID) | ORAL | Status: DC

## 2014-04-23 MED ORDER — ROSUVASTATIN CALCIUM 20 MG PO TABS: 20.0000 mg | ORAL_TABLET | Freq: Every day | ORAL | Status: DC

## 2014-04-23 MED ORDER — VENLAFAXINE HCL 75 MG PO TABS: 75.0000 mg | ORAL_TABLET | Freq: Three times a day (TID) | ORAL | Status: DC

## 2014-04-23 MED ORDER — LISINOPRIL 10 MG PO TABS: 10.0000 mg | ORAL_TABLET | Freq: Every day | ORAL | Status: DC

## 2014-04-23 NOTE — Patient Instructions (Signed)

## 2014-04-23 NOTE — Progress Notes (Signed)
Pre visit review using our clinic review tool, if applicable. No additional management support is needed unless otherwise documented below in the visit note. 

## 2014-04-24 LAB — URINALYSIS
Bilirubin Urine: NEGATIVE
Glucose, UA: NEGATIVE mg/dL
HGB URINE DIPSTICK: NEGATIVE
KETONES UR: NEGATIVE mg/dL
Leukocytes, UA: NEGATIVE
NITRITE: NEGATIVE
Protein, ur: NEGATIVE mg/dL
Specific Gravity, Urine: 1.012 (ref 1.005–1.030)
Urobilinogen, UA: 0.2 mg/dL (ref 0.0–1.0)
pH: 6.5 (ref 5.0–8.0)

## 2014-04-24 LAB — HEMOGLOBIN A1C
HEMOGLOBIN A1C: 6.6 % — AB (ref <5.7)
Mean Plasma Glucose: 143 mg/dL — ABNORMAL HIGH (ref <117)

## 2014-04-24 LAB — PSA: PSA: 3.75 ng/mL (ref <=4.00)

## 2014-04-24 LAB — SEDIMENTATION RATE: Sed Rate: 4 mm/h (ref 0–16)

## 2014-04-25 ENCOUNTER — Encounter: Payer: Self-pay | Admitting: Family Medicine

## 2014-04-25 DIAGNOSIS — R5383 Other fatigue: Secondary | ICD-10-CM | POA: Insufficient documentation

## 2014-04-25 DIAGNOSIS — F329 Major depressive disorder, single episode, unspecified: Secondary | ICD-10-CM | POA: Insufficient documentation

## 2014-04-25 DIAGNOSIS — M545 Low back pain, unspecified: Secondary | ICD-10-CM | POA: Insufficient documentation

## 2014-04-25 HISTORY — DX: Low back pain, unspecified: M54.50

## 2014-04-25 HISTORY — DX: Other fatigue: R53.83

## 2014-04-25 LAB — URINE CULTURE
Colony Count: NO GROWTH
Organism ID, Bacteria: NO GROWTH

## 2014-04-25 NOTE — Assessment & Plan Note (Addendum)
Encouraged complete cessation. Discussed need to quit as relates to risk of numerous cancers, cardiac and pulmonary disease as well as neurologic complications. Counseled for greater than 3 minutes. Had some Chantix at home so has recently restarted and has been able to cut down is given refills today. No cig x 2 weeks

## 2014-04-25 NOTE — Assessment & Plan Note (Signed)
hgba1c acceptable, minimize simple carbs. Increase exercise as tolerated. Continue current meds 

## 2014-04-25 NOTE — Assessment & Plan Note (Signed)
Given flu shot  today 

## 2014-04-25 NOTE — Assessment & Plan Note (Signed)
Improved on recheck. Well controlled, no changes to meds. Encouraged heart healthy diet such as the DASH diet and exercise as tolerated.  

## 2014-04-25 NOTE — Assessment & Plan Note (Signed)
Venlafaxine was prescribed tid but was only taking bid so will have him increase to tid due to recent increase symptoms

## 2014-04-25 NOTE — Assessment & Plan Note (Signed)
Encouraged heart healthy diet, increase exercise, avoid trans fats, consider a krill oil cap daily 

## 2014-04-25 NOTE — Progress Notes (Signed)
Patient ID: Nathan Hess, male   DOB: 1951-02-09, 63 y.o.   MRN: [id number] RAFFERTY POSTLEWAIT [id number] 01-Feb-1951 04/25/2014      Progress Note-Follow Up  Subjective  Chief Complaint  Chief Complaint  Patient presents with  . Medication Refill  . Injections    flu    HPI  Patient is a 63 year old male in today for routine medical care. Patient is noting about 9-10 days of respiratory symptoms. Had a fever to 102, malaise and myalgias head congestion both head and chest. Has a cough which is generally nonproductive. Has also been having some left lower quadrant abdominal pain and urinary frequency. Quit smoking about 2 weeks ago with the use of Chantix. No other recent illness. Does struggle with some ongoing neck pain. Denies CP/palp/SOB/HA/congestion/fevers/GI or GU c/o. Taking meds as prescribed  Past Medical History  Diagnosis Date  . Migraine headache 08/03/2012  . Diabetes mellitus type 2 in obese 05/25/2012  . Hyperlipidemia 05/25/2012  . Tobacco use disorder 10/30/2012  . Other malaise and fatigue 11/01/2012  . Left knee DJD   . Unspecified essential hypertension 05/25/2012    Does not see a cardiologist  . Depression   . Enlarged prostate     Past Surgical History  Procedure Laterality Date  . Left hand surgery    . Partial knee arthroplasty Left 04/06/2013    Procedure: UNICOMPARTMENTAL LEFT KNEE;  Surgeon: Nilda Simmer, MD;  Location[facility] OR;  Service: Orthopedics;  Laterality: Left;    Family History  Problem Relation Age of Onset  . Alzheimer's disease Mother   . Hypertension Father     History   Social History  . Marital Status: Married    Spouse Name: N/A    Number of Children: N/A  . Years of Education: N/A   Occupational History  . Not on file.   Social History Main Topics  . Smoking status: Current Every Day Smoker -- 2.00 packs/day for 15 years    Types: Cigarettes  . Smokeless tobacco: Not on file  . Alcohol Use: Yes     Comment:  1 beer about every 2 months   . Drug Use: No  . Sexual Activity: Yes   Other Topics Concern  . Not on file   Social History Narrative  . No narrative on file    Current Outpatient Prescriptions on File Prior to Visit  Medication Sig Dispense Refill  . aspirin 325 MG tablet Take 1 tablet (325 mg total) by mouth daily.  30 tablet  0   No current facility-administered medications on file prior to visit.    No Known Allergies  Review of Systems  Review of Systems  Constitutional: Positive for fever and malaise/fatigue.  HENT: Positive for congestion.   Eyes: Negative for discharge.  Respiratory: Positive for cough and sputum production. Negative for shortness of breath.   Cardiovascular: Negative for chest pain, palpitations and leg swelling.  Gastrointestinal: Negative for nausea, abdominal pain and diarrhea.  Genitourinary: Positive for urgency and frequency. Negative for dysuria.  Musculoskeletal: Positive for myalgias. Negative for falls.  Skin: Negative for rash.  Neurological: Positive for headaches. Negative for loss of consciousness.  Endo/Heme/Allergies: Negative for polydipsia.  Psychiatric/Behavioral: Positive for depression. Negative for suicidal ideas. The patient is nervous/anxious. The patient does not have insomnia.     Objective  BP 128/84  Pulse 91  Temp(Src) 97.9 F (36.6 C) (Oral)  Ht 5\' 11"  (1.803 m)  Wt 231 lb  3.2 oz (104.872 kg)  BMI 32.26 kg/m2  SpO2 100%  Physical Exam  Physical Exam  Constitutional: He is oriented to person, place, and time and well-developed, well-nourished, and in no distress. No distress.  HENT:  Head: Normocephalic and atraumatic.  Eyes: Conjunctivae are normal.  Neck: Neck supple. No thyromegaly present.  Cardiovascular: Normal rate, regular rhythm and normal heart sounds.   No murmur heard. Pulmonary/Chest: Effort normal and breath sounds normal. No respiratory distress.  Abdominal: He exhibits no distension and  no mass. There is no tenderness.  Musculoskeletal: He exhibits no edema.  Neurological: He is alert and oriented to person, place, and time.  Skin: Skin is warm.  Psychiatric: Memory, affect and judgment normal.    Lab Results  Component Value Date   TSH 1.766 04/23/2014   Lab Results  Component Value Date   WBC 8.8 04/23/2014   HGB 15.0 04/23/2014   HCT 41.3 04/23/2014   MCV 88.8 04/23/2014   PLT 256 04/23/2014   Lab Results  Component Value Date   CREATININE 0.90 04/23/2014   BUN 13 04/23/2014   NA 139 04/23/2014   K 4.1 04/23/2014   CL 103 04/23/2014   CO2 27 04/23/2014   Lab Results  Component Value Date   ALT 42 04/23/2014   AST 22 04/23/2014   ALKPHOS 67 04/23/2014   BILITOT 0.6 04/23/2014   Lab Results  Component Value Date   CHOL 219* 04/23/2014   Lab Results  Component Value Date   HDL 42 04/23/2014   Lab Results  Component Value Date   LDLCALC 150* 04/23/2014   Lab Results  Component Value Date   TRIG 134 04/23/2014   Lab Results  Component Value Date   CHOLHDL 5.2 04/23/2014     Assessment & Plan  Tobacco use disorder Encouraged complete cessation. Discussed need to quit as relates to risk of numerous cancers, cardiac and pulmonary disease as well as neurologic complications. Counseled for greater than 3 minutes. Had some Chantix at home so has recently restarted and has been able to cut down is given refills today. No cig x 2 weeks  Depression Venlafaxine was prescribed tid but was only taking bid so will have him increase to tid due to recent increase symptoms  Hyperlipidemia Encouraged heart healthy diet, increase exercise, avoid trans fats, consider a krill oil cap daily  Diabetes mellitus type 2 in obese hgba1c acceptable, minimize simple carbs. Increase exercise as tolerated. Continue current meds  Essential hypertension Improved on recheck. Well controlled, no changes to meds. Encouraged heart healthy diet such as the DASH diet and exercise as  tolerated.   Viral URI with cough Has been ill x 10 days, actually better today but still present start Ciprofloxacin, Encouraged increased rest and hydration, add probiotics, zinc such as Coldeze or Xicam. Treat fevers as needed  Need for prophylactic vaccination and inoculation against influenza Given flu shot today

## 2014-04-25 NOTE — Assessment & Plan Note (Addendum)
Has been ill x 10 days, actually better today but still present start Ciprofloxacin, Encouraged increased rest and hydration, add probiotics, zinc such as Coldeze or Xicam. Treat fevers as needed

## 2014-04-26 ENCOUNTER — Telehealth: Payer: Self-pay | Admitting: Family Medicine

## 2014-04-26 NOTE — Telephone Encounter (Signed)
emmi emailed °

## 2014-04-27 ENCOUNTER — Ambulatory Visit (HOSPITAL_COMMUNITY): Payer: BC Managed Care – PPO | Attending: Family Medicine | Admitting: Radiology

## 2014-04-27 DIAGNOSIS — E119 Type 2 diabetes mellitus without complications: Secondary | ICD-10-CM | POA: Insufficient documentation

## 2014-04-27 DIAGNOSIS — I10 Essential (primary) hypertension: Secondary | ICD-10-CM | POA: Diagnosis not present

## 2014-04-27 DIAGNOSIS — E785 Hyperlipidemia, unspecified: Secondary | ICD-10-CM | POA: Insufficient documentation

## 2014-04-27 DIAGNOSIS — R5383 Other fatigue: Secondary | ICD-10-CM

## 2014-04-27 DIAGNOSIS — R011 Cardiac murmur, unspecified: Secondary | ICD-10-CM

## 2014-04-27 DIAGNOSIS — Z87891 Personal history of nicotine dependence: Secondary | ICD-10-CM | POA: Diagnosis not present

## 2014-04-27 NOTE — Progress Notes (Signed)
Echocardiogram performed.  

## 2014-04-29 ENCOUNTER — Telehealth: Payer: Self-pay | Admitting: Family Medicine

## 2014-04-29 NOTE — Telephone Encounter (Signed)
°  Pharmacy: [facility]  Reason for call:  Will be sending over a fax to replace Crestor with something that does not need a PA, such as lipitor.  FYI

## 2014-04-30 NOTE — Telephone Encounter (Signed)
Will look into paperwork when we get this

## 2014-08-19 ENCOUNTER — Other Ambulatory Visit: Payer: Self-pay | Admitting: Family Medicine

## 2014-08-19 DIAGNOSIS — F329 Major depressive disorder, single episode, unspecified: Secondary | ICD-10-CM

## 2014-08-19 DIAGNOSIS — F32A Depression, unspecified: Secondary | ICD-10-CM

## 2014-08-19 NOTE — Telephone Encounter (Signed)
Patient calling back. He took his last one yesterday and needs this sent in.

## 2014-08-19 NOTE — Telephone Encounter (Signed)
Caller name: Mickle AsperMontgomery, Nicholai C Relation to pt: self  Call back number: [phone] Pharmacy: [id number] - [place], [place] - [phone] [phone] (Phone)   Reason for call:  Pt is completely out of venlafaxine (EFFEXOR) 75 MG tablet requesting a refill

## 2014-08-20 NOTE — Telephone Encounter (Signed)
Advised wife that the med was sent to the pharmacy.

## 2014-09-21 ENCOUNTER — Telehealth: Payer: Self-pay | Admitting: *Deleted

## 2014-09-21 ENCOUNTER — Ambulatory Visit (INDEPENDENT_AMBULATORY_CARE_PROVIDER_SITE_OTHER): Payer: BLUE CROSS/BLUE SHIELD | Admitting: Family Medicine

## 2014-09-21 ENCOUNTER — Encounter: Payer: Self-pay | Admitting: Family Medicine

## 2014-09-21 VITALS — BP 142/84 | HR 90 | Temp 98.7°F | Ht 71.0 in | Wt 234.4 lb

## 2014-09-21 DIAGNOSIS — I10 Essential (primary) hypertension: Secondary | ICD-10-CM

## 2014-09-21 DIAGNOSIS — E119 Type 2 diabetes mellitus without complications: Secondary | ICD-10-CM

## 2014-09-21 DIAGNOSIS — Z720 Tobacco use: Secondary | ICD-10-CM

## 2014-09-21 DIAGNOSIS — E785 Hyperlipidemia, unspecified: Secondary | ICD-10-CM

## 2014-09-21 DIAGNOSIS — Z23 Encounter for immunization: Secondary | ICD-10-CM

## 2014-09-21 DIAGNOSIS — F17200 Nicotine dependence, unspecified, uncomplicated: Secondary | ICD-10-CM

## 2014-09-21 DIAGNOSIS — Z0000 Encounter for general adult medical examination without abnormal findings: Secondary | ICD-10-CM

## 2014-09-21 DIAGNOSIS — M545 Low back pain: Secondary | ICD-10-CM

## 2014-09-21 DIAGNOSIS — E669 Obesity, unspecified: Secondary | ICD-10-CM

## 2014-09-21 DIAGNOSIS — E1169 Type 2 diabetes mellitus with other specified complication: Secondary | ICD-10-CM

## 2014-09-21 DIAGNOSIS — F329 Major depressive disorder, single episode, unspecified: Secondary | ICD-10-CM

## 2014-09-21 DIAGNOSIS — F32A Depression, unspecified: Secondary | ICD-10-CM

## 2014-09-21 LAB — COMPREHENSIVE METABOLIC PANEL
ALBUMIN: 4.7 g/dL (ref 3.5–5.2)
ALT: 41 U/L (ref 0–53)
AST: 23 U/L (ref 0–37)
Alkaline Phosphatase: 69 U/L (ref 39–117)
BUN: 14 mg/dL (ref 6–23)
CALCIUM: 9.6 mg/dL (ref 8.4–10.5)
CO2: 31 mEq/L (ref 19–32)
Chloride: 104 mEq/L (ref 96–112)
Creatinine, Ser: 0.84 mg/dL (ref 0.40–1.50)
GFR: 97.77 mL/min (ref >60.00)
GLUCOSE: 110 mg/dL — AB (ref 70–99)
POTASSIUM: 4.1 meq/L (ref 3.5–5.1)
Sodium: 138 mEq/L (ref 135–145)
Total Bilirubin: 0.4 mg/dL (ref 0.2–1.2)
Total Protein: 7.9 g/dL (ref 6.0–8.3)

## 2014-09-21 LAB — HEMOGLOBIN A1C: Hgb A1c MFr Bld: 7 % — ABNORMAL HIGH (ref 4.6–6.5)

## 2014-09-21 LAB — CBC
HEMATOCRIT: 44.8 % (ref 39.0–52.0)
HEMOGLOBIN: 15.6 g/dL (ref 13.0–17.0)
MCHC: 34.8 g/dL (ref 30.0–36.0)
MCV: 92.6 fl (ref 78.0–100.0)
Platelets: 241 10*3/uL (ref 150.0–400.0)
RBC: 4.84 Mil/uL (ref 4.22–5.81)
RDW: 13.1 % (ref 11.5–15.5)
WBC: 9.4 10*3/uL (ref 4.0–10.5)

## 2014-09-21 LAB — LIPID PANEL
CHOL/HDL RATIO: 5
Cholesterol: 206 mg/dL — ABNORMAL HIGH (ref 0–200)
HDL: 39.6 mg/dL (ref >39.00)
LDL CALC: 144 mg/dL — AB (ref 0–99)
NONHDL: 166.4
Triglycerides: 113 mg/dL (ref 0.0–149.0)
VLDL: 22.6 mg/dL (ref 0.0–40.0)

## 2014-09-21 LAB — TSH: TSH: 1.37 u[IU]/mL (ref 0.35–4.50)

## 2014-09-21 MED ORDER — VARENICLINE TARTRATE 1 MG PO TABS: 1.0000 mg | ORAL_TABLET | Freq: Two times a day (BID) | ORAL | Status: DC

## 2014-09-21 MED ORDER — ROSUVASTATIN CALCIUM 20 MG PO TABS: 20.0000 mg | ORAL_TABLET | Freq: Every day | ORAL | Status: DC

## 2014-09-21 MED ORDER — METFORMIN HCL 500 MG PO TABS: 500.0000 mg | ORAL_TABLET | Freq: Every day | ORAL | Status: DC

## 2014-09-21 MED ORDER — VARENICLINE TARTRATE 0.5 MG X 11 & 1 MG X 42 PO MISC: ORAL | Status: DC

## 2014-09-21 MED ORDER — VENLAFAXINE HCL 75 MG PO TABS: ORAL_TABLET | ORAL | Status: DC

## 2014-09-21 MED ORDER — LISINOPRIL 10 MG PO TABS: 10.0000 mg | ORAL_TABLET | Freq: Every day | ORAL | Status: DC

## 2014-09-21 MED ORDER — TRIAMCINOLONE ACETONIDE 0.1 % EX CREA: 1.0000 "application " | TOPICAL_CREAM | Freq: Two times a day (BID) | CUTANEOUS | Status: DC

## 2014-09-21 NOTE — Assessment & Plan Note (Addendum)
Well controlled, no changes to meds. Encouraged heart healthy diet such as the DASH diet and exercise as tolerated. He was out of Lisinopril for several weeks until today when he restarted it

## 2014-09-21 NOTE — Assessment & Plan Note (Signed)
hgba1c acceptable, minimize simple carbs. Increase exercise as tolerated. Continue current meds 

## 2014-09-21 NOTE — Progress Notes (Signed)
Nathan Hess  [id number] 1950/11/13 09/21/2014      Progress Note-Follow Up  Subjective  Chief Complaint  Chief Complaint  Patient presents with  . Annual Exam    HPI  Patient is a 64 y.o. male in today for routine medical care. Patient is in today for annual exam. He is generally doing well. He has no recent illness. He believes he had a tetanus shot about 5 years ago [facility] but is unsure. He believes he had a colonoscopy in [place] but does not murmur when. He denies polyuria and polydipsia. Acknowledges he has not been following a diabetic diet. Is not having any trouble with ADLs home and hopes to be retired at this time next year. Denies CP/palp/SOB/HA/congestion/fevers/GI or GU c/o. Taking meds as prescribed. Past Medical History  Diagnosis Date  . Migraine headache 08/03/2012  . Diabetes mellitus type 2 in obese 05/25/2012  . Hyperlipidemia 05/25/2012  . Tobacco use disorder 10/30/2012  . Other malaise and fatigue 11/01/2012  . Left knee DJD   . Unspecified essential hypertension 05/25/2012    Does not see a cardiologist  . Depression   . Enlarged prostate     Past Surgical History  Procedure Laterality Date  . Left hand surgery    . Partial knee arthroplasty Left 04/06/2013    Procedure: UNICOMPARTMENTAL LEFT KNEE;  Surgeon: Nilda Simmer, MD;  Location: [facility] OR;  Service: Orthopedics;  Laterality: Left;    Family History  Problem Relation Age of Onset  . Alzheimer's disease Mother   . Hypertension Father     History   Social History  . Marital Status: Married    Spouse Name: N/A  . Number of Children: N/A  . Years of Education: N/A   Occupational History  . Not on file.   Social History Main Topics  . Smoking status: Current Every Day Smoker -- 2.00 packs/day for 15 years    Types: Cigarettes  . Smokeless tobacco: Not on file  . Alcohol Use: Yes     Comment: 1 beer about every 2 months   . Drug Use: No  . Sexual  Activity: Yes   Other Topics Concern  . Not on file   Social History Narrative    Current Outpatient Prescriptions on File Prior to Visit  Medication Sig Dispense Refill  . aspirin 325 MG tablet Take 1 tablet (325 mg total) by mouth daily. (Patient not taking: Reported on 09/21/2014) 30 tablet 0  . varenicline (CHANTIX CONTINUING MONTH PAK) 1 MG tablet Take 1 tablet (1 mg total) by mouth 2 (two) times daily. (Patient not taking: Reported on 09/21/2014) 60 tablet 4   No current facility-administered medications on file prior to visit.    No Known Allergies  Review of Systems  Review of Systems  Constitutional: Negative for fever, chills and malaise/fatigue.  HENT: Negative for congestion, hearing loss and nosebleeds.   Eyes: Negative for discharge.  Respiratory: Negative for cough, sputum production, shortness of breath and wheezing.   Cardiovascular: Negative for chest pain, palpitations and leg swelling.  Gastrointestinal: Negative for heartburn, nausea, vomiting, abdominal pain, diarrhea, constipation and blood in stool.  Genitourinary: Negative for dysuria, urgency, frequency and hematuria.  Musculoskeletal: Negative for myalgias, back pain and falls.  Skin: Negative for rash.  Neurological: Negative for dizziness, tremors, sensory change, focal weakness, loss of consciousness, weakness and headaches.  Endo/Heme/Allergies: Negative for polydipsia. Does not bruise/bleed easily.  Psychiatric/Behavioral: Negative for depression and suicidal  ideas. The patient is not nervous/anxious and does not have insomnia.     Objective  BP 142/84 mmHg  Pulse 90  Temp(Src) 98.7 F (37.1 C) (Oral)  Ht 5\' 11"  (1.803 m)  Wt 234 lb 6 oz (106.312 kg)  BMI 32.70 kg/m2  SpO2 96%  Physical Exam  Physical Exam  Constitutional: He is oriented to person, place, and time and well-developed, well-nourished, and in no distress. No distress.  HENT:  Head: Normocephalic and atraumatic.  Eyes:  Conjunctivae are normal.  Neck: Neck supple. No thyromegaly present.  Cardiovascular: Normal rate, regular rhythm and normal heart sounds.   No murmur heard. Pulmonary/Chest: Effort normal and breath sounds normal. No respiratory distress.  Abdominal: He exhibits no distension and no mass. There is no tenderness.  Musculoskeletal: He exhibits no edema.  Neurological: He is alert and oriented to person, place, and time.  Skin: Skin is warm.  Psychiatric: Memory, affect and judgment normal.    Lab Results  Component Value Date   TSH 1.766 04/23/2014   Lab Results  Component Value Date   WBC 8.8 04/23/2014   HGB 15.0 04/23/2014   HCT 41.3 04/23/2014   MCV 88.8 04/23/2014   PLT 256 04/23/2014   Lab Results  Component Value Date   CREATININE 0.90 04/23/2014   BUN 13 04/23/2014   NA 139 04/23/2014   K 4.1 04/23/2014   CL 103 04/23/2014   CO2 27 04/23/2014   Lab Results  Component Value Date   ALT 42 04/23/2014   AST 22 04/23/2014   ALKPHOS 67 04/23/2014   BILITOT 0.6 04/23/2014   Lab Results  Component Value Date   CHOL 219* 04/23/2014   Lab Results  Component Value Date   HDL 42 04/23/2014   Lab Results  Component Value Date   LDLCALC 150* 04/23/2014   Lab Results  Component Value Date   TRIG 134 04/23/2014   Lab Results  Component Value Date   CHOLHDL 5.2 04/23/2014     Assessment & Plan  Essential hypertension Well controlled, no changes to meds. Encouraged heart healthy diet such as the DASH diet and exercise as tolerated. He was out of Lisinopril for several weeks until today when he restarted it   Diabetes mellitus type 2 in obese hgba1c acceptable, minimize simple carbs. Increase exercise as tolerated. Continue current meds   Hyperlipidemia Tolerating statin, encouraged heart healthy diet, avoid trans fats, minimize simple carbs and saturated fats. Increase exercise as tolerated   Tobacco use disorder Encouraged complete cessation.  Discussed need to quit as relates to risk of numerous cancers, cardiac and pulmonary disease as well as neurologic complications. Counseled for greater than 3 minutes. Given new prescription for Chantix starter pack and ongoing refills   Lumbago Encouraged moist heat and gentle stretching as tolerated. May try NSAIDs and prescription meds as directed and report if symptoms worsen or seek immediate care   Preventative health care Patient encouraged to maintain heart healthy diet, regular exercise, adequate sleep. Consider daily probiotics. Take medications as prescribed. Given Prevnar and Zostavax today. UTD on flu shot. Labs reviewed. He has had a colonoscopy in [place] in past but does not remember where he agrees to get us contact info so we can request records.

## 2014-09-21 NOTE — Patient Instructions (Addendum)
Rel of [facility] visit to ED in 2010, need proof of tetanus  DASH diet     Preventive Care for Adults A healthy lifestyle and preventive care can promote health and wellness. Preventive health guidelines for men include the following key practices:  A routine yearly physical is a good way to check with your health care provider about your health and preventative screening. It is a chance to share any concerns and updates on your health and to receive a thorough exam.  Visit your dentist for a routine exam and preventative care every 6 months. Brush your teeth twice a day and floss once a day. Good oral hygiene prevents tooth decay and gum disease.  The frequency of eye exams is based on your age, health, family medical history, use of contact lenses, and other factors. Follow your health care provider's recommendations for frequency of eye exams.  Eat a healthy diet. Foods such as vegetables, fruits, whole grains, low-fat dairy products, and lean protein foods contain the nutrients you need without too many calories. Decrease your intake of foods high in solid fats, added sugars, and salt. Eat the right amount of calories for you.Get information about a proper diet from your health care provider, if necessary.  Regular physical exercise is one of the most important things you can do for your health. Most adults should get at least 150 minutes of moderate-intensity exercise (any activity that increases your heart rate and causes you to sweat) each week. In addition, most adults need muscle-strengthening exercises on 2 or more days a week.  Maintain a healthy weight. The body mass index (BMI) is a screening tool to identify possible weight problems. It provides an estimate of body fat based on height and weight. Your health care provider can find your BMI and can help you achieve or maintain a healthy weight.For adults 20 years and older:  A BMI below 18.5 is considered  underweight.  A BMI of 18.5 to 24.9 is normal.  A BMI of 25 to 29.9 is considered overweight.  A BMI of 30 and above is considered obese.  Maintain normal blood lipids and cholesterol levels by exercising and minimizing your intake of saturated fat. Eat a balanced diet with plenty of fruit and vegetables. Blood tests for lipids and cholesterol should begin at age 42 and be repeated every 5 years. If your lipid or cholesterol levels are high, you are over 50, or you are at high risk for heart disease, you may need your cholesterol levels checked more frequently.Ongoing high lipid and cholesterol levels should be treated with medicines if diet and exercise are not working.  If you smoke, find out from your health care provider how to quit. If you do not use tobacco, do not start.  Lung cancer screening is recommended for adults aged 37-80 years who are at high risk for developing lung cancer because of a history of smoking. A yearly low-dose CT scan of the lungs is recommended for people who have at least a 30-pack-year history of smoking and are a current smoker or have quit within the past 15 years. A pack year of smoking is smoking an average of 1 pack of cigarettes a day for 1 year (for example: 1 pack a day for 30 years or 2 packs a day for 15 years). Yearly screening should continue until the smoker has stopped smoking for at least 15 years. Yearly screening should be stopped for people who develop a  health problem that would prevent them from having lung cancer treatment.  If you choose to drink alcohol, do not have more than 2 drinks per day. One drink is considered to be 12 ounces (355 mL) of beer, 5 ounces (148 mL) of wine, or 1.5 ounces (44 mL) of liquor.  Avoid use of street drugs. Do not share needles with anyone. Ask for help if you need support or instructions about stopping the use of drugs.  High blood pressure causes heart disease and increases the risk of stroke. Your blood  pressure should be checked at least every 1-2 years. Ongoing high blood pressure should be treated with medicines, if weight loss and exercise are not effective.  If you are 37-67 years old, ask your health care provider if you should take aspirin to prevent heart disease.  Diabetes screening involves taking a blood sample to check your fasting blood sugar level. This should be done once every 3 years, after age 48, if you are within normal weight and without risk factors for diabetes. Testing should be considered at a younger age or be carried out more frequently if you are overweight and have at least 1 risk factor for diabetes.  Colorectal cancer can be detected and often prevented. Most routine colorectal cancer screening begins at the age of 70 and continues through age 55. However, your health care provider may recommend screening at an earlier age if you have risk factors for colon cancer. On a yearly basis, your health care provider may provide home test kits to check for hidden blood in the stool. Use of a small camera at the end of a tube to directly examine the colon (sigmoidoscopy or colonoscopy) can detect the earliest forms of colorectal cancer. Talk to your health care provider about this at age 7, when routine screening begins. Direct exam of the colon should be repeated every 5-10 years through age 56, unless early forms of precancerous polyps or small growths are found.  People who are at an increased risk for hepatitis B should be screened for this virus. You are considered at high risk for hepatitis B if:  You were born in a country where hepatitis B occurs often. Talk with your health care provider about which countries are considered high risk.  Your parents were born in a high-risk country and you have not received a shot to protect against hepatitis B (hepatitis B vaccine).  You have HIV or AIDS.  You use needles to inject street drugs.  You live with, or have sex with,  someone who has hepatitis B.  You are a man who has sex with other men (MSM).  You get hemodialysis treatment.  You take certain medicines for conditions such as cancer, organ transplantation, and autoimmune conditions.  Hepatitis C blood testing is recommended for all people born from [birth year] through 1965 and any individual with known risks for hepatitis C.  Practice safe sex. Use condoms and avoid high-risk sexual practices to reduce the spread of sexually transmitted infections (STIs). STIs include gonorrhea, chlamydia, syphilis, trichomonas, herpes, HPV, and human immunodeficiency virus (HIV). Herpes, HIV, and HPV are viral illnesses that have no cure. They can result in disability, cancer, and death.  If you are at risk of being infected with HIV, it is recommended that you take a prescription medicine daily to prevent HIV infection. This is called preexposure prophylaxis (PrEP). You are considered at risk if:  You are a man who has sex with other men (  MSM) and have other risk factors.  You are a heterosexual man, are sexually active, and are at increased risk for HIV infection.  You take drugs by injection.  You are sexually active with a partner who has HIV.  Talk with your health care provider about whether you are at high risk of being infected with HIV. If you choose to begin PrEP, you should first be tested for HIV. You should then be tested every 3 months for as long as you are taking PrEP.  A one-time screening for abdominal aortic aneurysm (AAA) and surgical repair of large AAAs by ultrasound are recommended for men ages 84 to 35 years who are current or former smokers.  Healthy men should no longer receive prostate-specific antigen (PSA) blood tests as part of routine cancer screening. Talk with your health care provider about prostate cancer screening.  Testicular cancer screening is not recommended for adult males who have no symptoms. Screening includes self-exam, a health  care provider exam, and other screening tests. Consult with your health care provider about any symptoms you have or any concerns you have about testicular cancer.  Use sunscreen. Apply sunscreen liberally and repeatedly throughout the day. You should seek shade when your shadow is shorter than you. Protect yourself by wearing long sleeves, pants, a wide-brimmed hat, and sunglasses year round, whenever you are outdoors.  Once a month, do a whole-body skin exam, using a mirror to look at the skin on your back. Tell your health care provider about new moles, moles that have irregular borders, moles that are larger than a pencil eraser, or moles that have changed in shape or color.  Stay current with required vaccines (immunizations).  Influenza vaccine. All adults should be immunized every year.  Tetanus, diphtheria, and acellular pertussis (Td, Tdap) vaccine. An adult who has not previously received Tdap or who does not know his vaccine status should receive 1 dose of Tdap. This initial dose should be followed by tetanus and diphtheria toxoids (Td) booster doses every 10 years. Adults with an unknown or incomplete history of completing a 3-dose immunization series with Td-containing vaccines should begin or complete a primary immunization series including a Tdap dose. Adults should receive a Td booster every 10 years.  Varicella vaccine. An adult without evidence of immunity to varicella should receive 2 doses or a second dose if he has previously received 1 dose.  Human papillomavirus (HPV) vaccine. Males aged 4-21 years who have not received the vaccine previously should receive the 3-dose series. Males aged 22-26 years may be immunized. Immunization is recommended through the age of 46 years for any male who has sex with males and did not get any or all doses earlier. Immunization is recommended for any person with an immunocompromised condition through the age of 69 years if he did not get any or  all doses earlier. During the 3-dose series, the second dose should be obtained 4-8 weeks after the first dose. The third dose should be obtained 24 weeks after the first dose and 16 weeks after the second dose.  Zoster vaccine. One dose is recommended for adults aged 49 years or older unless certain conditions are present.  Measles, mumps, and rubella (MMR) vaccine. Adults born before [birth year] generally are considered immune to measles and mumps. Adults born in [birth year] or later should have 1 or more doses of MMR vaccine unless there is a contraindication to the vaccine or there is laboratory evidence of immunity to each of the  three diseases. A routine second dose of MMR vaccine should be obtained at least 28 days after the first dose for students attending postsecondary schools, health care workers, or international travelers. People who received inactivated measles vaccine or an unknown type of measles vaccine during 1963-1967 should receive 2 doses of MMR vaccine. People who received inactivated mumps vaccine or an unknown type of mumps vaccine before 1979 and are at high risk for mumps infection should consider immunization with 2 doses of MMR vaccine. Unvaccinated health care workers born before [birth year] who lack laboratory evidence of measles, mumps, or rubella immunity or laboratory confirmation of disease should consider measles and mumps immunization with 2 doses of MMR vaccine or rubella immunization with 1 dose of MMR vaccine.  Pneumococcal 13-valent conjugate (PCV13) vaccine. When indicated, a person who is uncertain of his immunization history and has no record of immunization should receive the PCV13 vaccine. An adult aged 76 years or older who has certain medical conditions and has not been previously immunized should receive 1 dose of PCV13 vaccine. This PCV13 should be followed with a dose of pneumococcal polysaccharide (PPSV23) vaccine. The PPSV23 vaccine dose should be obtained at least 8 weeks after  the dose of PCV13 vaccine. An adult aged 73 years or older who has certain medical conditions and previously received 1 or more doses of PPSV23 vaccine should receive 1 dose of PCV13. The PCV13 vaccine dose should be obtained 1 or more years after the last PPSV23 vaccine dose.  Pneumococcal polysaccharide (PPSV23) vaccine. When PCV13 is also indicated, PCV13 should be obtained first. All adults aged 52 years and older should be immunized. An adult younger than age 25 years who has certain medical conditions should be immunized. Any person who resides in a nursing home or long-term care facility should be immunized. An adult smoker should be immunized. People with an immunocompromised condition and certain other conditions should receive both PCV13 and PPSV23 vaccines. People with human immunodeficiency virus (HIV) infection should be immunized as soon as possible after diagnosis. Immunization during chemotherapy or radiation therapy should be avoided. Routine use of PPSV23 vaccine is not recommended for American Indians, [place] Natives, or people younger than 65 years unless there are medical conditions that require PPSV23 vaccine. When indicated, people who have unknown immunization and have no record of immunization should receive PPSV23 vaccine. One-time revaccination 5 years after the first dose of PPSV23 is recommended for people aged 19-64 years who have chronic kidney failure, nephrotic syndrome, asplenia, or immunocompromised conditions. People who received 1-2 doses of PPSV23 before age 71 years should receive another dose of PPSV23 vaccine at age 56 years or later if at least 5 years have passed since the previous dose. Doses of PPSV23 are not needed for people immunized with PPSV23 at or after age 30 years.  Meningococcal vaccine. Adults with asplenia or persistent complement component deficiencies should receive 2 doses of quadrivalent meningococcal conjugate (MenACWY-D) vaccine. The doses should be  obtained at least 2 months apart. Microbiologists working with certain meningococcal bacteria, [employer] recruits, people at risk during an outbreak, and people who travel to or live in countries with a high rate of meningitis should be immunized. A first-year college student up through age 45 years who is living in a residence hall should receive a dose if he did not receive a dose on or after his 16th birthday. Adults who have certain high-risk conditions should receive one or more doses of vaccine.  Hepatitis A vaccine. Adults  who wish to be protected from this disease, have certain high-risk conditions, work with hepatitis A-infected animals, work in hepatitis A research labs, or travel to or work in countries with a high rate of hepatitis A should be immunized. Adults who were previously unvaccinated and who anticipate close contact with an international adoptee during the first 60 days after arrival in the [place] from a country with a high rate of hepatitis A should be immunized.  Hepatitis B vaccine. Adults should be immunized if they wish to be protected from this disease, have certain high-risk conditions, may be exposed to blood or other infectious body fluids, are household contacts or sex partners of hepatitis B positive people, are clients or workers in certain care facilities, or travel to or work in countries with a high rate of hepatitis B.  Haemophilus influenzae type b (Hib) vaccine. A previously unvaccinated person with asplenia or sickle cell disease or having a scheduled splenectomy should receive 1 dose of Hib vaccine. Regardless of previous immunization, a recipient of a hematopoietic stem cell transplant should receive a 3-dose series 6-12 months after his successful transplant. Hib vaccine is not recommended for adults with HIV infection. Preventive Service / Frequency Ages 19 to 60  Blood pressure check.** / Every 1 to 2 years.  Lipid and cholesterol check.** / Every 5  years beginning at age 64.  Hepatitis C blood test.** / For any individual with known risks for hepatitis C.  Skin self-exam. / Monthly.  Influenza vaccine. / Every year.  Tetanus, diphtheria, and acellular pertussis (Tdap, Td) vaccine.** / Consult your health care provider. 1 dose of Td every 10 years.  Varicella vaccine.** / Consult your health care provider.  HPV vaccine. / 3 doses over 6 months, if 27 or younger.  Measles, mumps, rubella (MMR) vaccine.** / You need at least 1 dose of MMR if you were born in 1957 or later. You may also need a second dose.  Pneumococcal 13-valent conjugate (PCV13) vaccine.** / Consult your health care provider.  Pneumococcal polysaccharide (PPSV23) vaccine.** / 1 to 2 doses if you smoke cigarettes or if you have certain conditions.  Meningococcal vaccine.** / 1 dose if you are age 15 to 61 years and a Market researcher living in a residence hall, or have one of several medical conditions. You may also need additional booster doses.  Hepatitis A vaccine.** / Consult your health care provider.  Hepatitis B vaccine.** / Consult your health care provider.  Haemophilus influenzae type b (Hib) vaccine.** / Consult your health care provider. Ages 53 to 3  Blood pressure check.** / Every 1 to 2 years.  Lipid and cholesterol check.** / Every 5 years beginning at age 53.  Lung cancer screening. / Every year if you are aged 33-80 years and have a 30-pack-year history of smoking and currently smoke or have quit within the past 15 years. Yearly screening is stopped once you have quit smoking for at least 15 years or develop a health problem that would prevent you from having lung cancer treatment.  Fecal occult blood test (FOBT) of stool. / Every year beginning at age 61 and continuing until age 71. You may not have to do this test if you get a colonoscopy every 10 years.  Flexible sigmoidoscopy** or colonoscopy.** / Every 5 years for a flexible  sigmoidoscopy or every 10 years for a colonoscopy beginning at age 67 and continuing until age 23.  Hepatitis C blood test.** / For all people  born from [birth year] through 1965 and any individual with known risks for hepatitis C.  Skin self-exam. / Monthly.  Influenza vaccine. / Every year.  Tetanus, diphtheria, and acellular pertussis (Tdap/Td) vaccine.** / Consult your health care provider. 1 dose of Td every 10 years.  Varicella vaccine.** / Consult your health care provider.  Zoster vaccine.** / 1 dose for adults aged 72 years or older.  Measles, mumps, rubella (MMR) vaccine.** / You need at least 1 dose of MMR if you were born in 1957 or later. You may also need a second dose.  Pneumococcal 13-valent conjugate (PCV13) vaccine.** / Consult your health care provider.  Pneumococcal polysaccharide (PPSV23) vaccine.** / 1 to 2 doses if you smoke cigarettes or if you have certain conditions.  Meningococcal vaccine.** / Consult your health care provider.  Hepatitis A vaccine.** / Consult your health care provider.  Hepatitis B vaccine.** / Consult your health care provider.  Haemophilus influenzae type b (Hib) vaccine.** / Consult your health care provider. Ages 16 and over  Blood pressure check.** / Every 1 to 2 years.  Lipid and cholesterol check.**/ Every 5 years beginning at age 67.  Lung cancer screening. / Every year if you are aged 34-80 years and have a 30-pack-year history of smoking and currently smoke or have quit within the past 15 years. Yearly screening is stopped once you have quit smoking for at least 15 years or develop a health problem that would prevent you from having lung cancer treatment.  Fecal occult blood test (FOBT) of stool. / Every year beginning at age 98 and continuing until age 83. You may not have to do this test if you get a colonoscopy every 10 years.  Flexible sigmoidoscopy** or colonoscopy.** / Every 5 years for a flexible sigmoidoscopy or every 10  years for a colonoscopy beginning at age 26 and continuing until age 5.  Hepatitis C blood test.** / For all people born from [birth year] through 1965 and any individual with known risks for hepatitis C.  Abdominal aortic aneurysm (AAA) screening.** / A one-time screening for ages 60 to 20 years who are current or former smokers.  Skin self-exam. / Monthly.  Influenza vaccine. / Every year.  Tetanus, diphtheria, and acellular pertussis (Tdap/Td) vaccine.** / 1 dose of Td every 10 years.  Varicella vaccine.** / Consult your health care provider.  Zoster vaccine.** / 1 dose for adults aged 76 years or older.  Pneumococcal 13-valent conjugate (PCV13) vaccine.** / Consult your health care provider.  Pneumococcal polysaccharide (PPSV23) vaccine.** / 1 dose for all adults aged 50 years and older.  Meningococcal vaccine.** / Consult your health care provider.  Hepatitis A vaccine.** / Consult your health care provider.  Hepatitis B vaccine.** / Consult your health care provider.  Haemophilus influenzae type b (Hib) vaccine.** / Consult your health care provider. **Family history and personal history of risk and conditions may change your health care provider's recommendations. Document Released: 09/04/2001 Document Revised: 07/14/2013 Document Reviewed: 12/04/2010 [facility] Patient Information 2015 [place], [place]. This information is not intended to replace advice given to you by your health care provider. Make sure you discuss any questions you have with your health care provider.

## 2014-09-21 NOTE — Progress Notes (Signed)
Pre visit review using our clinic review tool, if applicable. No additional management support is needed unless otherwise documented below in the visit note. 

## 2014-09-21 NOTE — Telephone Encounter (Signed)
Prior authorization for Crestor approved effective 08/22/2014 through 09/20/2016. [id number]

## 2014-09-22 ENCOUNTER — Telehealth: Payer: Self-pay | Admitting: Family Medicine

## 2014-09-22 ENCOUNTER — Encounter: Payer: Self-pay | Admitting: Family Medicine

## 2014-09-22 DIAGNOSIS — Z0000 Encounter for general adult medical examination without abnormal findings: Secondary | ICD-10-CM

## 2014-09-22 HISTORY — DX: Encounter for general adult medical examination without abnormal findings: Z00.00

## 2014-09-22 MED ORDER — LISINOPRIL 10 MG PO TABS: 10.0000 mg | ORAL_TABLET | Freq: Every day | ORAL | Status: DC

## 2014-09-22 MED ORDER — LISINOPRIL 10 MG PO TABS: 10.0000 mg | ORAL_TABLET | Freq: Two times a day (BID) | ORAL | Status: DC

## 2014-09-22 NOTE — Assessment & Plan Note (Signed)
Encouraged complete cessation. Discussed need to quit as relates to risk of numerous cancers, cardiac and pulmonary disease as well as neurologic complications. Counseled for greater than 3 minutes. Given new prescription for Chantix starter pack and ongoing refills

## 2014-09-22 NOTE — Telephone Encounter (Signed)
emmi emailed °

## 2014-09-22 NOTE — Assessment & Plan Note (Signed)
Patient encouraged to maintain heart healthy diet, regular exercise, adequate sleep. Consider daily probiotics. Take medications as prescribed. Given Prevnar and Zostavax today. UTD on flu shot. Labs reviewed. He has had a colonoscopy in [place] in past but does not remember where he agrees to get us contact info so we can request records.

## 2014-09-22 NOTE — Assessment & Plan Note (Signed)
Encouraged moist heat and gentle stretching as tolerated. May try NSAIDs and prescription meds as directed and report if symptoms worsen or seek immediate care 

## 2014-09-22 NOTE — Assessment & Plan Note (Signed)
Tolerating statin, encouraged heart healthy diet, avoid trans fats, minimize simple carbs and saturated fats. Increase exercise as tolerated 

## 2014-09-23 ENCOUNTER — Other Ambulatory Visit: Payer: Self-pay | Admitting: Family Medicine

## 2014-09-23 DIAGNOSIS — L578 Other skin changes due to chronic exposure to nonionizing radiation: Secondary | ICD-10-CM

## 2014-09-25 ENCOUNTER — Encounter: Payer: Self-pay | Admitting: Family Medicine

## 2014-10-14 LAB — HM DIABETES EYE EXAM

## 2014-10-29 DIAGNOSIS — D125 Benign neoplasm of sigmoid colon: Secondary | ICD-10-CM | POA: Insufficient documentation

## 2014-12-23 ENCOUNTER — Ambulatory Visit: Payer: BLUE CROSS/BLUE SHIELD | Admitting: Family Medicine

## 2014-12-24 ENCOUNTER — Encounter: Payer: Self-pay | Admitting: Family Medicine

## 2014-12-24 ENCOUNTER — Ambulatory Visit (INDEPENDENT_AMBULATORY_CARE_PROVIDER_SITE_OTHER): Payer: BLUE CROSS/BLUE SHIELD | Admitting: Family Medicine

## 2014-12-24 ENCOUNTER — Ambulatory Visit: Payer: BLUE CROSS/BLUE SHIELD | Admitting: Family Medicine

## 2014-12-24 ENCOUNTER — Ambulatory Visit (HOSPITAL_BASED_OUTPATIENT_CLINIC_OR_DEPARTMENT_OTHER)
Admission: RE | Admit: 2014-12-24 | Discharge: 2014-12-24 | Disposition: A | Discharge status: Home / Self Care | Payer: BLUE CROSS/BLUE SHIELD | Source: Ambulatory Visit | Attending: Family Medicine | Admitting: Family Medicine

## 2014-12-24 VITALS — BP 157/101 | HR 94 | Temp 98.6°F | Ht 71.0 in | Wt 229.2 lb

## 2014-12-24 DIAGNOSIS — F32A Depression, unspecified: Secondary | ICD-10-CM

## 2014-12-24 DIAGNOSIS — E7800 Pure hypercholesterolemia, unspecified: Secondary | ICD-10-CM

## 2014-12-24 DIAGNOSIS — E785 Hyperlipidemia, unspecified: Secondary | ICD-10-CM | POA: Diagnosis not present

## 2014-12-24 DIAGNOSIS — R05 Cough: Secondary | ICD-10-CM | POA: Insufficient documentation

## 2014-12-24 DIAGNOSIS — E669 Obesity, unspecified: Secondary | ICD-10-CM

## 2014-12-24 DIAGNOSIS — F17200 Nicotine dependence, unspecified, uncomplicated: Secondary | ICD-10-CM

## 2014-12-24 DIAGNOSIS — E119 Type 2 diabetes mellitus without complications: Secondary | ICD-10-CM

## 2014-12-24 DIAGNOSIS — E118 Type 2 diabetes mellitus with unspecified complications: Secondary | ICD-10-CM | POA: Diagnosis not present

## 2014-12-24 DIAGNOSIS — I10 Essential (primary) hypertension: Secondary | ICD-10-CM | POA: Diagnosis not present

## 2014-12-24 DIAGNOSIS — M545 Low back pain: Secondary | ICD-10-CM

## 2014-12-24 DIAGNOSIS — R059 Cough, unspecified: Secondary | ICD-10-CM

## 2014-12-24 DIAGNOSIS — F329 Major depressive disorder, single episode, unspecified: Secondary | ICD-10-CM

## 2014-12-24 DIAGNOSIS — E1169 Type 2 diabetes mellitus with other specified complication: Secondary | ICD-10-CM

## 2014-12-24 DIAGNOSIS — Z720 Tobacco use: Secondary | ICD-10-CM

## 2014-12-24 LAB — COMPREHENSIVE METABOLIC PANEL
ALBUMIN: 4.5 g/dL (ref 3.5–5.2)
ALT: 33 U/L (ref 0–53)
AST: 20 U/L (ref 0–37)
Alkaline Phosphatase: 67 U/L (ref 39–117)
BUN: 15 mg/dL (ref 6–23)
CO2: 29 mEq/L (ref 19–32)
Calcium: 9.4 mg/dL (ref 8.4–10.5)
Chloride: 100 mEq/L (ref 96–112)
Creatinine, Ser: 0.91 mg/dL (ref 0.40–1.50)
GFR: 89.07 mL/min (ref >60.00)
GLUCOSE: 178 mg/dL — AB (ref 70–99)
Potassium: 4.3 mEq/L (ref 3.5–5.1)
Sodium: 134 mEq/L — ABNORMAL LOW (ref 135–145)
TOTAL PROTEIN: 7.6 g/dL (ref 6.0–8.3)
Total Bilirubin: 0.5 mg/dL (ref 0.2–1.2)

## 2014-12-24 LAB — LIPID PANEL
Cholesterol: 157 mg/dL (ref 0–200)
HDL: 44 mg/dL (ref >39.00)
LDL Cholesterol: 86 mg/dL (ref 0–99)
NONHDL: 113
Total CHOL/HDL Ratio: 4
Triglycerides: 133 mg/dL (ref 0.0–149.0)
VLDL: 26.6 mg/dL (ref 0.0–40.0)

## 2014-12-24 LAB — CBC
HEMATOCRIT: 44.4 % (ref 39.0–52.0)
HEMOGLOBIN: 15 g/dL (ref 13.0–17.0)
MCHC: 33.8 g/dL (ref 30.0–36.0)
MCV: 94.5 fl (ref 78.0–100.0)
Platelets: 229 10*3/uL (ref 150.0–400.0)
RBC: 4.69 Mil/uL (ref 4.22–5.81)
RDW: 13.1 % (ref 11.5–15.5)
WBC: 10 10*3/uL (ref 4.0–10.5)

## 2014-12-24 LAB — HEMOGLOBIN A1C: HEMOGLOBIN A1C: 6.7 % — AB (ref 4.6–6.5)

## 2014-12-24 LAB — TSH: TSH: 1.47 u[IU]/mL (ref 0.35–4.50)

## 2014-12-24 MED ORDER — METFORMIN HCL 500 MG PO TABS: 500.0000 mg | ORAL_TABLET | Freq: Every day | ORAL | Status: DC

## 2014-12-24 MED ORDER — ROSUVASTATIN CALCIUM 20 MG PO TABS: 20.0000 mg | ORAL_TABLET | Freq: Every day | ORAL | Status: DC

## 2014-12-24 MED ORDER — SULFAMETHOXAZOLE-TRIMETHOPRIM 800-160 MG PO TABS: 1.0000 | ORAL_TABLET | Freq: Two times a day (BID) | ORAL | Status: DC

## 2014-12-24 MED ORDER — VARENICLINE TARTRATE 0.5 MG X 11 & 1 MG X 42 PO MISC: ORAL | Status: DC

## 2014-12-24 MED ORDER — LISINOPRIL 10 MG PO TABS: 10.0000 mg | ORAL_TABLET | Freq: Every day | ORAL | Status: DC

## 2014-12-24 NOTE — Progress Notes (Signed)
Nathan Hess  [id number] [birth year] 12/24/2014      Progress Note-Follow Up  Subjective  Chief Complaint  Chief Complaint  Patient presents with  . Follow-up    HPI  Patient is a 64 y.o. male in today for routine medical care. Patient is in today for follow-up. Is interested in trying to quit smoking again in requesting a refill on the starter kit of his Chantix. He has had a cough with some mild sputum production off and on for a week or 2. No fevers or chills. No malaise or myalgias. Notes some mild skin irritation over his right chest wall as well. Has been out of his medications for about 4 days can see was traveling. Denies any headaches or concerning symptoms. Denies CP/palp/SOB/HA/congestion/fevers/GI or GU c/o. Taking meds as prescribed  Past Medical History  Diagnosis Date  . Migraine headache 08/03/2012  . Diabetes mellitus type 2 in obese 05/25/2012  . Hyperlipidemia 05/25/2012  . Tobacco use disorder 10/30/2012  . Other malaise and fatigue 11/01/2012  . Left knee DJD   . Unspecified essential hypertension 05/25/2012    Does not see a cardiologist  . Depression   . Enlarged prostate   . Preventative health care 09/22/2014    Past Surgical History  Procedure Laterality Date  . Left hand surgery    . Partial knee arthroplasty Left 04/06/2013    Procedure: UNICOMPARTMENTAL LEFT KNEE;  Surgeon: Lorn Junes, MD;  Location: [facility];  Service: Orthopedics;  Laterality: Left;    Family History  Problem Relation Age of Onset  . Alzheimer's disease Mother   . Hypertension Father   . Diabetes Brother     ?  Marland Kitchen Alcohol abuse Maternal Grandfather   . Cancer Paternal Grandmother   . Heart disease Paternal Grandfather     MI    History   Social History  . Marital Status: Married    Spouse Name: N/A  . Number of Children: N/A  . Years of Education: N/A   Occupational History  . Not on file.   Social History Main Topics  . Smoking status: Current Every Day  Smoker -- 2.00 packs/day for 15 years    Types: Cigarettes  . Smokeless tobacco: Not on file  . Alcohol Use: Yes     Comment: 1 beer about every 2 months   . Drug Use: No  . Sexual Activity: Yes    Birth Control/ Protection: Condom   Other Topics Concern  . Not on file   Social History Narrative    Current Outpatient Prescriptions on File Prior to Visit  Medication Sig Dispense Refill  . aspirin 325 MG tablet Take 1 tablet (325 mg total) by mouth daily. 30 tablet 0  . triamcinolone cream (KENALOG) 0.1 % Apply 1 application topically 2 (two) times daily. 80 g 1  . venlafaxine (EFFEXOR) 75 MG tablet TAKE 1 TABLET (75 MG TOTAL) BY MOUTH 3 (THREE) TIMES DAILY WITH MEALS. 270 tablet 3  . lisinopril (PRINIVIL,ZESTRIL) 10 MG tablet Take 1 tablet (10 mg total) by mouth daily. (Patient not taking: Reported on 12/24/2014) 90 tablet 3  . metFORMIN (GLUCOPHAGE) 500 MG tablet Take 1 tablet (500 mg total) by mouth daily with breakfast. (Patient not taking: Reported on 12/24/2014) 90 tablet 3  . rosuvastatin (CRESTOR) 20 MG tablet Take 1 tablet (20 mg total) by mouth daily. (Patient not taking: Reported on 12/24/2014) 90 tablet 3  . varenicline (CHANTIX CONTINUING MONTH PAK) 1 MG tablet  Take 1 tablet (1 mg total) by mouth 2 (two) times daily. (Patient not taking: Reported on 12/24/2014) 60 tablet 4   No current facility-administered medications on file prior to visit.    No Known Allergies  Review of Systems  Review of Systems  Constitutional: Negative for fever, chills and malaise/fatigue.  HENT: Positive for congestion. Negative for hearing loss and nosebleeds.   Eyes: Negative for discharge.  Respiratory: Positive for cough, sputum production and wheezing. Negative for shortness of breath.   Cardiovascular: Negative for chest pain, palpitations and leg swelling.  Gastrointestinal: Negative for heartburn, nausea, vomiting, abdominal pain, diarrhea, constipation and blood in stool.  Genitourinary:  Negative for dysuria, urgency, frequency and hematuria.  Musculoskeletal: Negative for myalgias, back pain and falls.  Skin: Negative for rash.  Neurological: Negative for dizziness, tremors, sensory change, focal weakness, loss of consciousness, weakness and headaches.  Endo/Heme/Allergies: Negative for polydipsia. Does not bruise/bleed easily.  Psychiatric/Behavioral: Negative for depression and suicidal ideas. The patient is not nervous/anxious and does not have insomnia.     Objective  BP 157/101 mmHg  Pulse 94  Temp(Src) 98.6 F (37 C) (Oral)  Ht 5' 11"  (1.803 m)  Wt 229 lb 4 oz (103.987 kg)  BMI 31.99 kg/m2  SpO2 99%  Physical Exam  Physical Exam  Constitutional: He is oriented to person, place, and time and well-developed, well-nourished, and in no distress. No distress.  HENT:  Head: Normocephalic and atraumatic.  Eyes: Conjunctivae are normal.  Neck: Neck supple. No thyromegaly present.  Cardiovascular: Normal rate, regular rhythm and normal heart sounds.   No murmur heard. Pulmonary/Chest: Effort normal and breath sounds normal. No respiratory distress.  Abdominal: He exhibits no distension and no mass. There is no tenderness.  Musculoskeletal: He exhibits no edema.  Neurological: He is alert and oriented to person, place, and time.  Skin: Skin is warm.  Psychiatric: Memory, affect and judgment normal.    Lab Results  Component Value Date   TSH 1.37 09/21/2014   Lab Results  Component Value Date   WBC 9.4 09/21/2014   HGB 15.6 09/21/2014   HCT 44.8 09/21/2014   MCV 92.6 09/21/2014   PLT 241.0 09/21/2014   Lab Results  Component Value Date   CREATININE 0.84 09/21/2014   BUN 14 09/21/2014   NA 138 09/21/2014   K 4.1 09/21/2014   CL 104 09/21/2014   CO2 31 09/21/2014   Lab Results  Component Value Date   ALT 41 09/21/2014   AST 23 09/21/2014   ALKPHOS 69 09/21/2014   BILITOT 0.4 09/21/2014   Lab Results  Component Value Date   CHOL 206*  09/21/2014   Lab Results  Component Value Date   HDL 39.60 09/21/2014   Lab Results  Component Value Date   LDLCALC 144* 09/21/2014   Lab Results  Component Value Date   TRIG 113.0 09/21/2014   Lab Results  Component Value Date   CHOLHDL 5 09/21/2014     Assessment & Plan  Essential hypertension Poorly controlled but patient has been out of meds for four day. Will restart and patient will monitor and report concerning numbers.   Diabetes mellitus type 2 in obese hgba1c acceptable, minimize simple carbs. Increase exercise as tolerated. Continue current meds  Hyperlipidemia Tolerating statin, encouraged heart healthy diet, avoid trans fats, minimize simple carbs and saturated fats. Increase exercise as tolerated  Tobacco use disorder Cough without fever. Encouraged complete cessation. Discussed need to quit as relates to risk  of numerous cancers, cardiac and pulmonary disease as well as neurologic complications. Counseled for greater than 3 minutes. Given chantix to assist. Warned regarding side effects.

## 2014-12-24 NOTE — Progress Notes (Signed)
Pre visit review using our clinic review tool, if applicable. No additional management support is needed unless otherwise documented below in the visit note. 

## 2014-12-24 NOTE — Patient Instructions (Signed)
Encouraged increased rest and hydration, add probiotics, zinc such as Coldeze or Xicam. Treat fevers as needed. Mucinex twice a day x 10 days. Elderberry twice daily   Nicotine Addiction Nicotine can act as both a stimulant (excites/activates) and a sedative (calms/quiets). Immediately after exposure to nicotine, there is a "kick" caused in part by the drug's stimulation of the adrenal glands and resulting discharge of adrenaline (epinephrine). The rush of adrenaline stimulates the body and causes a sudden release of sugar. This means that smokers are always slightly hyperglycemic. Hyperglycemic means that the blood sugar is high, just like in diabetics. Nicotine also decreases the amount of insulin which helps control sugar levels in the body. There is an increase in blood pressure, breathing, and the rate of heart beats.  In addition, nicotine indirectly causes a release of dopamine in the brain that controls pleasure and motivation. A similar reaction is seen with other drugs of abuse, such as cocaine and heroin. This dopamine release is thought to cause the pleasurable sensations when smoking. In some different cases, nicotine can also create a calming effect, depending on sensitivity of the smoker's nervous system and the dose of nicotine taken. WHAT HAPPENS WHEN NICOTINE IS TAKEN FOR LONG PERIODS OF TIME?  Long-term use of nicotine results in addiction. It is difficult to stop.  Repeated use of nicotine creates tolerance. Higher doses of nicotine are needed to get the "kick." When nicotine use is stopped, withdrawal may last a month or more. Withdrawal may begin within a few hours after the last cigarette. Symptoms peak within the first few days and may lessen within a few weeks. For some people, however, symptoms may last for months or longer. Withdrawal symptoms include:   Irritability.  Craving.  Learning and attention deficits.  Sleep disturbances.  Increased appetite. Craving for  tobacco may last for 6 months or longer. Many behaviors done while using nicotine can also play a part in the severity of withdrawal symptoms. For some people, the feel, smell, and sight of a cigarette and the ritual of obtaining, handling, lighting, and smoking the cigarette are closely linked with the pleasure of smoking. When stopped, they also miss the related behaviors which make the withdrawal or craving worse. While nicotine gum and patches may lessen the drug aspects of withdrawal, cravings often persist. WHAT ARE THE MEDICAL CONSEQUENCES OF NICOTINE USE?  Nicotine addiction accounts for one-third of all cancers. The top cancer caused by tobacco is lung cancer. Lung cancer is the number one cancer killer of both men and women.  Smoking is also associated with cancers of the:  Mouth.  Pharynx.  Larynx.  Esophagus.  Stomach.  Pancreas.  Cervix.  Kidney.  Ureter.  Bladder.  Smoking also causes lung diseases such as lasting (chronic) bronchitis and emphysema.  It worsens asthma in adults and children.  Smoking increases the risk of heart disease, including:  Stroke.  Heart attack.  Vascular disease.  Aneurysm.  Passive or secondary smoke can also increase medical risks including:  Asthma in children.  Sudden Infant Death Syndrome (SIDS).  Additionally, dropped cigarettes are the leading cause of residential fire fatalities.  Nicotine poisoning has been reported from accidental ingestion of tobacco products by children and pets. Death usually results in a few minutes from respiratory failure (when a person stops breathing) caused by paralysis. TREATMENT   Medication. Nicotine replacement medicines such as nicotine gum and the patch are used to stop smoking. These medicines gradually lower the dosage of nicotine  in the body. These medicines do not contain the carbon monoxide and other toxins found in tobacco smoke.  Hypnotherapy.  Relaxation  therapy.  Nicotine Anonymous (a 12-step support program). Find times and locations in your local yellow pages. Document Released: 03/14/2004 Document Revised: 10/01/2011 Document Reviewed: 09/04/2013 [facility] Patient Information 2015 [employer], [place]. This information is not intended to replace advice given to you by your health care provider. Make sure you discuss any questions you have with your health care provider.

## 2015-01-08 ENCOUNTER — Encounter: Payer: Self-pay | Admitting: Family Medicine

## 2015-01-08 NOTE — Assessment & Plan Note (Signed)
Tolerating statin, encouraged heart healthy diet, avoid trans fats, minimize simple carbs and saturated fats. Increase exercise as tolerated 

## 2015-01-08 NOTE — Assessment & Plan Note (Signed)
Cough without fever. Encouraged complete cessation. Discussed need to quit as relates to risk of numerous cancers, cardiac and pulmonary disease as well as neurologic complications. Counseled for greater than 3 minutes. Given chantix to assist. Warned regarding side effects.

## 2015-01-08 NOTE — Assessment & Plan Note (Addendum)
Poorly controlled but patient has been out of meds for four day. Will restart and patient will monitor and report concerning numbers.

## 2015-01-08 NOTE — Assessment & Plan Note (Signed)
hgba1c acceptable, minimize simple carbs. Increase exercise as tolerated. Continue current meds 

## 2015-03-29 ENCOUNTER — Ambulatory Visit (INDEPENDENT_AMBULATORY_CARE_PROVIDER_SITE_OTHER): Payer: BLUE CROSS/BLUE SHIELD | Admitting: Family Medicine

## 2015-03-29 ENCOUNTER — Encounter: Payer: Self-pay | Admitting: Family Medicine

## 2015-03-29 VITALS — BP 130/82 | HR 83 | Temp 97.7°F | Ht 71.0 in | Wt 234.0 lb

## 2015-03-29 DIAGNOSIS — Z720 Tobacco use: Secondary | ICD-10-CM | POA: Diagnosis not present

## 2015-03-29 DIAGNOSIS — I10 Essential (primary) hypertension: Secondary | ICD-10-CM | POA: Diagnosis not present

## 2015-03-29 DIAGNOSIS — F17200 Nicotine dependence, unspecified, uncomplicated: Secondary | ICD-10-CM

## 2015-03-29 DIAGNOSIS — E1169 Type 2 diabetes mellitus with other specified complication: Secondary | ICD-10-CM

## 2015-03-29 DIAGNOSIS — E669 Obesity, unspecified: Secondary | ICD-10-CM

## 2015-03-29 DIAGNOSIS — Z23 Encounter for immunization: Secondary | ICD-10-CM | POA: Diagnosis not present

## 2015-03-29 DIAGNOSIS — E119 Type 2 diabetes mellitus without complications: Secondary | ICD-10-CM

## 2015-03-29 DIAGNOSIS — E785 Hyperlipidemia, unspecified: Secondary | ICD-10-CM | POA: Diagnosis not present

## 2015-03-29 NOTE — Assessment & Plan Note (Signed)
Tolerating statin, encouraged heart healthy diet, avoid trans fats, minimize simple carbs and saturated fats. Increase exercise as tolerated 

## 2015-03-29 NOTE — Progress Notes (Signed)
Pre visit review using our clinic review tool, if applicable. No additional management support is needed unless otherwise documented below in the visit note. 

## 2015-03-29 NOTE — Assessment & Plan Note (Addendum)
hgba1c acceptable, minimize simple carbs. Increase exercise as tolerated. Continue current meds. Foot exam today

## 2015-03-29 NOTE — Assessment & Plan Note (Signed)
On week 6 of no cigarettes

## 2015-03-29 NOTE — Progress Notes (Signed)
Patient ID: Nathan Hess, male   DOB: [birth year], 64 y.o.   MRN: [id number]   Subjective:    Patient ID: Nathan Hess, male    DOB: 1951/03/01, 64 y.o.   MRN: [id number]  Chief Complaint  Patient presents with  . Follow-up    HPI Patient is in today for follow-up. Is on week 6 of no smoking. Has struggled with some anxiety but is generally doing well. No recent illness. Agrees to flu shot today. Denies any new acute concerns. Denies CP/palp/SOB/HA/congestion/fevers/GI or GU c/o. Taking meds as prescribed  Past Medical History  Diagnosis Date  . Migraine headache 08/03/2012  . Diabetes mellitus type 2 in obese 05/25/2012  . Hyperlipidemia 05/25/2012  . Tobacco use disorder 10/30/2012  . Other malaise and fatigue 11/01/2012  . Left knee DJD   . Unspecified essential hypertension 05/25/2012    Does not see a cardiologist  . Depression   . Enlarged prostate   . Preventative health care 09/22/2014    Past Surgical History  Procedure Laterality Date  . Left hand surgery    . Partial knee arthroplasty Left 04/06/2013    Procedure: UNICOMPARTMENTAL LEFT KNEE;  Surgeon: Nilda Simmer, MD;  Location: [facility];  Service: Orthopedics;  Laterality: Left;    Family History  Problem Relation Age of Onset  . Alzheimer's disease Mother   . Hypertension Father   . Diabetes Brother     ?  Marland Kitchen Alcohol abuse Maternal Grandfather   . Cancer Paternal Grandmother   . Heart disease Paternal Grandfather     MI    Social History   Social History  . Marital Status: Married    Spouse Name: N/A  . Number of Children: N/A  . Years of Education: N/A   Occupational History  . Not on file.   Social History Main Topics  . Smoking status: Current Every Day Smoker -- 2.00 packs/day for 15 years    Types: Cigarettes  . Smokeless tobacco: Not on file  . Alcohol Use: Yes     Comment: 1 beer about every 2 months   . Drug Use: No  . Sexual Activity: Yes    Birth Control/ Protection: Condom     Other Topics Concern  . Not on file   Social History Narrative    Outpatient Prescriptions Prior to Visit  Medication Sig Dispense Refill  . aspirin 325 MG tablet Take 1 tablet (325 mg total) by mouth daily. 30 tablet 0  . lisinopril (PRINIVIL,ZESTRIL) 10 MG tablet Take 1 tablet (10 mg total) by mouth daily. 90 tablet 3  . metFORMIN (GLUCOPHAGE) 500 MG tablet Take 1 tablet (500 mg total) by mouth daily with breakfast. 90 tablet 3  . rosuvastatin (CRESTOR) 20 MG tablet Take 1 tablet (20 mg total) by mouth daily. 90 tablet 3  . triamcinolone cream (KENALOG) 0.1 % Apply 1 application topically 2 (two) times daily. 80 g 1  . varenicline (CHANTIX CONTINUING MONTH PAK) 1 MG tablet Take 1 tablet (1 mg total) by mouth 2 (two) times daily. 60 tablet 4  . varenicline (CHANTIX STARTING MONTH PAK) 0.5 MG X 11 & 1 MG X 42 tablet Take one 0.5 mg tablet by mouth once daily for 3 days, then increase to one 0.5 mg tablet twice daily for 4 days, then increase to one 1 mg tablet twice daily. 53 tablet 0  . venlafaxine (EFFEXOR) 75 MG tablet TAKE 1 TABLET (75 MG TOTAL) BY MOUTH 3 (THREE)  TIMES DAILY WITH MEALS. 270 tablet 3  . sulfamethoxazole-trimethoprim (BACTRIM DS) 800-160 MG per tablet Take 1 tablet by mouth 2 (two) times daily. 20 tablet 0   No facility-administered medications prior to visit.    No Known Allergies  Review of Systems  Constitutional: Negative for fever and malaise/fatigue.  HENT: Negative for congestion.   Eyes: Negative for discharge.  Respiratory: Negative for shortness of breath.   Cardiovascular: Negative for chest pain, palpitations and leg swelling.  Gastrointestinal: Negative for nausea and abdominal pain.  Genitourinary: Negative for dysuria.  Musculoskeletal: Negative for falls.  Skin: Negative for rash.  Neurological: Negative for loss of consciousness and headaches.  Endo/Heme/Allergies: Negative for environmental allergies.  Psychiatric/Behavioral: Negative for  depression. The patient is not nervous/anxious.        Objective:    Physical Exam  Constitutional: He is oriented to person, place, and time. He appears well-developed and well-nourished. No distress.  HENT:  Head: Normocephalic and atraumatic.  Nose: Nose normal.  Eyes: Right eye exhibits no discharge. Left eye exhibits no discharge.  Neck: Normal range of motion. Neck supple.  Cardiovascular: Normal rate and regular rhythm.   No murmur heard. Pulmonary/Chest: Effort normal and breath sounds normal.  Abdominal: Soft. Bowel sounds are normal. There is no tenderness.  Musculoskeletal: He exhibits no edema.  Neurological: He is alert and oriented to person, place, and time.  Skin: Skin is warm and dry.  Psychiatric: He has a normal mood and affect.  Nursing note and vitals reviewed.   BP 146/85 mmHg  Pulse 83  Temp(Src) 97.7 F (36.5 C) (Oral)  Ht 5\' 11"  (1.803 m)  Wt 234 lb (106.142 kg)  BMI 32.65 kg/m2  SpO2 100% Wt Readings from Last 3 Encounters:  03/29/15 234 lb (106.142 kg)  12/24/14 229 lb 4 oz (103.987 kg)  09/21/14 234 lb 6 oz (106.312 kg)     Lab Results  Component Value Date   WBC 10.0 12/24/2014   HGB 15.0 12/24/2014   HCT 44.4 12/24/2014   PLT 229.0 12/24/2014   GLUCOSE 178* 12/24/2014   CHOL 157 12/24/2014   TRIG 133.0 12/24/2014   HDL 44.00 12/24/2014   LDLCALC 86 12/24/2014   ALT 33 12/24/2014   AST 20 12/24/2014   NA 134* 12/24/2014   K 4.3 12/24/2014   CL 100 12/24/2014   CREATININE 0.91 12/24/2014   BUN 15 12/24/2014   CO2 29 12/24/2014   TSH 1.47 12/24/2014   PSA 3.75 04/23/2014   INR 1.00 03/30/2013   HGBA1C 6.7* 12/24/2014   MICROALBUR 0.50 07/28/2012    Lab Results  Component Value Date   TSH 1.47 12/24/2014   Lab Results  Component Value Date   WBC 10.0 12/24/2014   HGB 15.0 12/24/2014   HCT 44.4 12/24/2014   MCV 94.5 12/24/2014   PLT 229.0 12/24/2014   Lab Results  Component Value Date   NA 134* 12/24/2014   K 4.3  12/24/2014   CO2 29 12/24/2014   GLUCOSE 178* 12/24/2014   BUN 15 12/24/2014   CREATININE 0.91 12/24/2014   BILITOT 0.5 12/24/2014   ALKPHOS 67 12/24/2014   AST 20 12/24/2014   ALT 33 12/24/2014   PROT 7.6 12/24/2014   ALBUMIN 4.5 12/24/2014   CALCIUM 9.4 12/24/2014   GFR 89.07 12/24/2014   Lab Results  Component Value Date   CHOL 157 12/24/2014   Lab Results  Component Value Date   HDL 44.00 12/24/2014   Lab Results  Component Value Date   LDLCALC 86 12/24/2014   Lab Results  Component Value Date   TRIG 133.0 12/24/2014   Lab Results  Component Value Date   CHOLHDL 4 12/24/2014   Lab Results  Component Value Date   HGBA1C 6.7* 12/24/2014       Assessment & Plan:  Essential hypertension Well controlled, no changes to meds. Encouraged heart healthy diet such as the DASH diet and exercise as tolerated.   Hyperlipidemia Tolerating statin, encouraged heart healthy diet, avoid trans fats, minimize simple carbs and saturated fats. Increase exercise as tolerated  Tobacco use disorder On week 6 of no cigarettes  Diabetes mellitus type 2 in obese hgba1c acceptable, minimize simple carbs. Increase exercise as tolerated. Continue current meds. Foot exam today  Need for prophylactic vaccination and inoculation against influenza Given flu shot today   I have discontinued Mr. Bornhorst's sulfamethoxazole-trimethoprim. I am also having him maintain his aspirin, venlafaxine, varenicline, triamcinolone cream, varenicline, metFORMIN, rosuvastatin, and lisinopril.  No orders of the defined types were placed in this encounter.     Baird Kay, LPN

## 2015-03-29 NOTE — Patient Instructions (Signed)

## 2015-03-29 NOTE — Assessment & Plan Note (Signed)
Well controlled, no changes to meds. Encouraged heart healthy diet such as the DASH diet and exercise as tolerated.  °

## 2015-04-06 ENCOUNTER — Telehealth: Payer: Self-pay | Admitting: Family Medicine

## 2015-04-06 NOTE — Telephone Encounter (Signed)
Pt is requesting Cialis for erectile dysfunction. Please send to [facility] in [place] [address]

## 2015-04-08 NOTE — Telephone Encounter (Signed)
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 In the past, he tried Viagra with inadequate results. He was given a refill for #9 with 11 refills.    Pt has not received Cialis from our facility. Please advise

## 2015-04-11 MED ORDER — TADALAFIL 20 MG PO TABS: 20.0000 mg | ORAL_TABLET | Freq: Every day | ORAL | Status: DC | PRN

## 2015-04-11 NOTE — Telephone Encounter (Signed)
Prescription sent in and patient informed. 

## 2015-04-11 NOTE — Telephone Encounter (Signed)
Please advise on this request please

## 2015-04-11 NOTE — Telephone Encounter (Signed)
Pt is calling to f/u on meds.

## 2015-04-11 NOTE — Telephone Encounter (Signed)
It is fine to let him have a prescription for Cialis 20 mg tabs, 1 tab po qd prn ED, disp #9 with 5 rf

## 2015-04-16 NOTE — Assessment & Plan Note (Signed)
Given flu shot  today 

## 2015-06-20 ENCOUNTER — Other Ambulatory Visit (INDEPENDENT_AMBULATORY_CARE_PROVIDER_SITE_OTHER): Payer: BLUE CROSS/BLUE SHIELD

## 2015-06-20 DIAGNOSIS — E1169 Type 2 diabetes mellitus with other specified complication: Secondary | ICD-10-CM

## 2015-06-20 DIAGNOSIS — E669 Obesity, unspecified: Secondary | ICD-10-CM | POA: Diagnosis not present

## 2015-06-20 DIAGNOSIS — E119 Type 2 diabetes mellitus without complications: Secondary | ICD-10-CM | POA: Diagnosis not present

## 2015-06-20 DIAGNOSIS — E785 Hyperlipidemia, unspecified: Secondary | ICD-10-CM | POA: Diagnosis not present

## 2015-06-20 DIAGNOSIS — I10 Essential (primary) hypertension: Secondary | ICD-10-CM

## 2015-06-20 LAB — CBC
HCT: 41.7 % (ref 39.0–52.0)
Hemoglobin: 14.1 g/dL (ref 13.0–17.0)
MCHC: 33.8 g/dL (ref 30.0–36.0)
MCV: 94.9 fl (ref 78.0–100.0)
Platelets: 222 10*3/uL (ref 150.0–400.0)
RBC: 4.39 Mil/uL (ref 4.22–5.81)
RDW: 12.6 % (ref 11.5–15.5)
WBC: 8.1 10*3/uL (ref 4.0–10.5)

## 2015-06-20 LAB — LIPID PANEL
Cholesterol: 141 mg/dL (ref 0–200)
HDL: 39.3 mg/dL (ref >39.00)
LDL Cholesterol: 76 mg/dL (ref 0–99)
NonHDL: 101.2
Total CHOL/HDL Ratio: 4
Triglycerides: 126 mg/dL (ref 0.0–149.0)
VLDL: 25.2 mg/dL (ref 0.0–40.0)

## 2015-06-20 LAB — COMPREHENSIVE METABOLIC PANEL
ALBUMIN: 4.4 g/dL (ref 3.5–5.2)
ALT: 37 U/L (ref 0–53)
AST: 21 U/L (ref 0–37)
Alkaline Phosphatase: 54 U/L (ref 39–117)
BUN: 20 mg/dL (ref 6–23)
CHLORIDE: 103 meq/L (ref 96–112)
CO2: 29 meq/L (ref 19–32)
Calcium: 9.2 mg/dL (ref 8.4–10.5)
Creatinine, Ser: 0.84 mg/dL (ref 0.40–1.50)
GFR: 97.54 mL/min (ref >60.00)
Glucose, Bld: 184 mg/dL — ABNORMAL HIGH (ref 70–99)
Potassium: 5.1 mEq/L (ref 3.5–5.1)
SODIUM: 139 meq/L (ref 135–145)
Total Bilirubin: 0.5 mg/dL (ref 0.2–1.2)
Total Protein: 7.2 g/dL (ref 6.0–8.3)

## 2015-06-20 LAB — TSH: TSH: 1.76 u[IU]/mL (ref 0.35–4.50)

## 2015-06-20 LAB — HEMOGLOBIN A1C: Hgb A1c MFr Bld: 6.9 % — ABNORMAL HIGH (ref 4.6–6.5)

## 2015-06-21 ENCOUNTER — Encounter: Payer: Self-pay | Admitting: Family Medicine

## 2015-06-24 ENCOUNTER — Encounter: Payer: Self-pay | Admitting: Family Medicine

## 2015-06-24 ENCOUNTER — Ambulatory Visit (INDEPENDENT_AMBULATORY_CARE_PROVIDER_SITE_OTHER): Payer: BLUE CROSS/BLUE SHIELD | Admitting: Family Medicine

## 2015-06-24 VITALS — BP 138/78 | HR 72 | Temp 97.8°F | Ht 71.0 in | Wt 240.1 lb

## 2015-06-24 DIAGNOSIS — E669 Obesity, unspecified: Secondary | ICD-10-CM

## 2015-06-24 DIAGNOSIS — I10 Essential (primary) hypertension: Secondary | ICD-10-CM

## 2015-06-24 DIAGNOSIS — E119 Type 2 diabetes mellitus without complications: Secondary | ICD-10-CM

## 2015-06-24 DIAGNOSIS — Z23 Encounter for immunization: Secondary | ICD-10-CM | POA: Diagnosis not present

## 2015-06-24 DIAGNOSIS — F32A Depression, unspecified: Secondary | ICD-10-CM

## 2015-06-24 DIAGNOSIS — F329 Major depressive disorder, single episode, unspecified: Secondary | ICD-10-CM | POA: Diagnosis not present

## 2015-06-24 DIAGNOSIS — Z1159 Encounter for screening for other viral diseases: Secondary | ICD-10-CM

## 2015-06-24 DIAGNOSIS — E785 Hyperlipidemia, unspecified: Secondary | ICD-10-CM | POA: Diagnosis not present

## 2015-06-24 DIAGNOSIS — F17200 Nicotine dependence, unspecified, uncomplicated: Secondary | ICD-10-CM

## 2015-06-24 DIAGNOSIS — Z86010 Personal history of colonic polyps: Secondary | ICD-10-CM

## 2015-06-24 DIAGNOSIS — E1169 Type 2 diabetes mellitus with other specified complication: Secondary | ICD-10-CM

## 2015-06-24 NOTE — Patient Instructions (Signed)

## 2015-06-24 NOTE — Progress Notes (Signed)
Subjective:    Patient ID: Nathan Hess, male    DOB: 1951/04/07, 64 y.o.   MRN: [id number]  Chief Complaint  Patient presents with  . Follow-up    HPI Patient is in today for follow-up. Overall doing well. Reports having had a colonoscopy sometime in 2016 in [place]. Denies any GI concerns. No recent illness. Denies any acute complaints. No polyuria or polydipsia. Is trying to maintain a heart healthy diet with decrease carbohydrate intake. Denies CP/palp/SOB/HA/congestion/fevers/GI or GU c/o. Taking meds as prescribed  Past Medical History  Diagnosis Date  . Migraine headache 08/03/2012  . Diabetes mellitus type 2 in obese (HCC) 05/25/2012  . Hyperlipidemia 05/25/2012  . Tobacco use disorder 10/30/2012  . Other malaise and fatigue 11/01/2012  . Left knee DJD   . Unspecified essential hypertension 05/25/2012    Does not see a cardiologist  . Depression   . Enlarged prostate   . Preventative health care 09/22/2014  . History of colonic polyps 06/26/2015    3rd colonoscopy in 2016 with Dr Grayling Congress in [place]    Past Surgical History  Procedure Laterality Date  . Left hand surgery    . Partial knee arthroplasty Left 04/06/2013    Procedure: UNICOMPARTMENTAL LEFT KNEE;  Surgeon: Nilda Simmer, MD;  Location: [facility] OR;  Service: Orthopedics;  Laterality: Left;    Family History  Problem Relation Age of Onset  . Alzheimer's disease Mother   . Hypertension Father   . Diabetes Brother     ?  Marland Kitchen Alcohol abuse Maternal Grandfather   . Cancer Paternal Grandmother   . Heart disease Paternal Grandfather     MI    Social History   Social History  . Marital Status: Married    Spouse Name: N/A  . Number of Children: N/A  . Years of Education: N/A   Occupational History  . Not on file.   Social History Main Topics  . Smoking status: Current Every Day Smoker -- 1.00 packs/day for 15 years    Types: Cigarettes  . Smokeless tobacco: Not on file  . Alcohol Use: Yes   Comment: 1 beer about every 2 months   . Drug Use: No  . Sexual Activity: Yes    Birth Control/ Protection: Condom   Other Topics Concern  . Not on file   Social History Narrative    Outpatient Prescriptions Prior to Visit  Medication Sig Dispense Refill  . aspirin 325 MG tablet Take 1 tablet (325 mg total) by mouth daily. 30 tablet 0  . lisinopril (PRINIVIL,ZESTRIL) 10 MG tablet Take 1 tablet (10 mg total) by mouth daily. 90 tablet 3  . metFORMIN (GLUCOPHAGE) 500 MG tablet Take 1 tablet (500 mg total) by mouth daily with breakfast. 90 tablet 3  . rosuvastatin (CRESTOR) 20 MG tablet Take 1 tablet (20 mg total) by mouth daily. 90 tablet 3  . tadalafil (CIALIS) 20 MG tablet Take 1 tablet (20 mg total) by mouth daily as needed for erectile dysfunction. 9 tablet 5  . triamcinolone cream (KENALOG) 0.1 % Apply 1 application topically 2 (two) times daily. 80 g 1  . varenicline (CHANTIX CONTINUING MONTH PAK) 1 MG tablet Take 1 tablet (1 mg total) by mouth 2 (two) times daily. 60 tablet 4  . varenicline (CHANTIX STARTING MONTH PAK) 0.5 MG X 11 & 1 MG X 42 tablet Take one 0.5 mg tablet by mouth once daily for 3 days, then increase to one 0.5 mg tablet twice  daily for 4 days, then increase to one 1 mg tablet twice daily. 53 tablet 0  . venlafaxine (EFFEXOR) 75 MG tablet TAKE 1 TABLET (75 MG TOTAL) BY MOUTH 3 (THREE) TIMES DAILY WITH MEALS. 270 tablet 3   No facility-administered medications prior to visit.    Allergies  Allergen Reactions  . Chantix [Varenicline] Other (See Comments)    depression    Review of Systems  Constitutional: Positive for malaise/fatigue. Negative for fever.  HENT: Negative for congestion.   Eyes: Negative for discharge.  Respiratory: Negative for shortness of breath.   Cardiovascular: Negative for chest pain, palpitations and leg swelling.  Gastrointestinal: Negative for nausea and abdominal pain.  Genitourinary: Negative for dysuria.  Musculoskeletal: Negative  for falls.  Skin: Negative for rash.  Neurological: Negative for loss of consciousness and headaches.  Endo/Heme/Allergies: Negative for environmental allergies.  Psychiatric/Behavioral: Positive for depression. The patient is not nervous/anxious.        Objective:    Physical Exam  Constitutional: He is oriented to person, place, and time. He appears well-developed and well-nourished. No distress.  HENT:  Head: Normocephalic and atraumatic.  Nose: Nose normal.  Eyes: Right eye exhibits no discharge. Left eye exhibits no discharge.  Neck: Normal range of motion. Neck supple.  Cardiovascular: Normal rate and regular rhythm.   No murmur heard. Pulmonary/Chest: Effort normal and breath sounds normal.  Abdominal: Soft. Bowel sounds are normal. There is no tenderness.  Musculoskeletal: He exhibits no edema.  Neurological: He is alert and oriented to person, place, and time.  Skin: Skin is warm and dry.  Psychiatric: He has a normal mood and affect.  Nursing note and vitals reviewed.   BP 138/78 mmHg  Pulse 72  Temp(Src) 97.8 F (36.6 C) (Oral)  Ht  (1.803 m)  Wt 240 lb 2 oz (108.92 kg)  BMI 33.51 kg/m2  SpO2 95% Wt Readings from Last 3 Encounters:  06/24/15 240 lb 2 oz (108.92 kg)  03/29/15 234 lb (106.142 kg)  12/24/14 229 lb 4 oz (103.987 kg)     Lab Results  Component Value Date   WBC 8.1 06/20/2015   HGB 14.1 06/20/2015   HCT 41.7 06/20/2015   PLT 222.0 06/20/2015   GLUCOSE 184* 06/20/2015   CHOL 141 06/20/2015   TRIG 126.0 06/20/2015   HDL 39.30 06/20/2015   LDLCALC 76 06/20/2015   ALT 37 06/20/2015   AST 21 06/20/2015   NA 139 06/20/2015   K 5.1 06/20/2015   CL 103 06/20/2015   CREATININE 0.84 06/20/2015   BUN 20 06/20/2015   CO2 29 06/20/2015   TSH 1.76 06/20/2015   PSA 3.75 04/23/2014   INR 1.00 03/30/2013   HGBA1C 6.9* 06/20/2015   MICROALBUR 0.50 07/28/2012    Lab Results  Component Value Date   TSH 1.76 06/20/2015   Lab Results    Component Value Date   WBC 8.1 06/20/2015   HGB 14.1 06/20/2015   HCT 41.7 06/20/2015   MCV 94.9 06/20/2015   PLT 222.0 06/20/2015   Lab Results  Component Value Date   NA 139 06/20/2015   K 5.1 06/20/2015   CO2 29 06/20/2015   GLUCOSE 184* 06/20/2015   BUN 20 06/20/2015   CREATININE 0.84 06/20/2015   BILITOT 0.5 06/20/2015   ALKPHOS 54 06/20/2015   AST 21 06/20/2015   ALT 37 06/20/2015   PROT 7.2 06/20/2015   ALBUMIN 4.4 06/20/2015   CALCIUM 9.2 06/20/2015   GFR 97.54 06/20/2015  Lab Results  Component Value Date   CHOL 141 06/20/2015   Lab Results  Component Value Date   HDL 39.30 06/20/2015   Lab Results  Component Value Date   LDLCALC 76 06/20/2015   Lab Results  Component Value Date   TRIG 126.0 06/20/2015   Lab Results  Component Value Date   CHOLHDL 4 06/20/2015   Lab Results  Component Value Date   HGBA1C 6.9* 06/20/2015       Assessment & Plan:   Problem List Items Addressed This Visit    Depression    ife has been ill and cat just died but is managing fairly well      Diabetes mellitus type 2 in obese (HCC) - Primary    hgba1c acceptable, minimize simple carbs. Increase exercise as tolerated. Continue current meds. Given TDAP      Relevant Orders   Hemoglobin A1c   Essential hypertension    Well controlled, no changes to meds. Encouraged heart healthy diet such as the DASH diet and exercise as tolerated.       Relevant Orders   TSH   CBC   Comprehensive metabolic panel   History of colonic polyps    3rd colonoscopy in 2016 with Dr Grayling Congress[place]pari in [place], will request records      Hyperlipidemia    Tolerating statin, encouraged heart healthy diet, avoid trans fats, minimize simple carbs and saturated fats. Increase exercise as tolerated      Relevant Orders   Lipid panel   Tobacco use disorder    Encouraged complete cessation. Discussed need to quit as relates to risk of numerous cancers, cardiac and pulmonary disease as well  as neurologic complications. Counseled for greater than 3 minutes       Other Visit Diagnoses    Screening for viral disease        Relevant Orders    Hepatitis C antibody    HIV antibody (with reflex)    Need for diphtheria-tetanus-pertussis (Tdap) vaccine, adult/adolescent        Relevant Orders    Tdap vaccine greater than or equal to 7yo IM (Completed)       I am having Mr. Peppers maintain his aspirin, venlafaxine, varenicline, triamcinolone cream, varenicline, metFORMIN, rosuvastatin, lisinopril, and tadalafil.  No orders of the defined types were placed in this encounter.     Danise EdgeBLYTH, Guhan Bruington, MD

## 2015-06-24 NOTE — Assessment & Plan Note (Signed)
Tolerating statin, encouraged heart healthy diet, avoid trans fats, minimize simple carbs and saturated fats. Increase exercise as tolerated 

## 2015-06-24 NOTE — Assessment & Plan Note (Signed)
ife has been ill and cat just died but is managing fairly well

## 2015-06-24 NOTE — Progress Notes (Signed)
Pre visit review using our clinic review tool, if applicable. No additional management support is needed unless otherwise documented below in the visit note. 

## 2015-06-24 NOTE — Assessment & Plan Note (Signed)
Encouraged complete cessation. Discussed need to quit as relates to risk of numerous cancers, cardiac and pulmonary disease as well as neurologic complications. Counseled for greater than 3 minutes 

## 2015-06-24 NOTE — Assessment & Plan Note (Addendum)
hgba1c acceptable, minimize simple carbs. Increase exercise as tolerated. Continue current meds. Given TDAP

## 2015-06-24 NOTE — Assessment & Plan Note (Signed)
Well controlled, no changes to meds. Encouraged heart healthy diet such as the DASH diet and exercise as tolerated.  °

## 2015-06-26 ENCOUNTER — Encounter: Payer: Self-pay | Admitting: Family Medicine

## 2015-06-26 DIAGNOSIS — Z860100 Personal history of colon polyps, unspecified: Secondary | ICD-10-CM

## 2015-06-26 HISTORY — DX: Personal history of colonic polyps: Z86.010

## 2015-06-26 HISTORY — DX: Personal history of colon polyps, unspecified: Z86.0100

## 2015-06-26 NOTE — Assessment & Plan Note (Signed)
3rd colonoscopy in 2016 with Dr Grayling Congress[place]pari in [place], will request records

## 2015-07-01 ENCOUNTER — Ambulatory Visit: Payer: BLUE CROSS/BLUE SHIELD | Admitting: Family Medicine

## 2015-07-24 HISTORY — PX: UMBILICAL HERNIA REPAIR: SHX2598

## 2015-08-03 ENCOUNTER — Telehealth: Payer: Self-pay | Admitting: Family Medicine

## 2015-08-03 DIAGNOSIS — E119 Type 2 diabetes mellitus without complications: Secondary | ICD-10-CM

## 2015-08-03 NOTE — Telephone Encounter (Signed)
Please advise 

## 2015-08-03 NOTE — Telephone Encounter (Signed)
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 Pt would like to come in for labs. Pending results he needs a letter to state that he either does not have diabetes, he has prediabetes, or he has diabetes. This all has to be submitted within 2 weeks to the FAA. Please notify pt if we can do this.  FYI - pt informed he will not be called until Thursday 08/04/15.

## 2015-08-03 NOTE — Telephone Encounter (Signed)
He has diabetes just early so no meds used. OK to order him a fasting CMP whenever he is able to come in.

## 2015-08-04 ENCOUNTER — Other Ambulatory Visit: Payer: Self-pay | Admitting: Family Medicine

## 2015-08-04 DIAGNOSIS — K469 Unspecified abdominal hernia without obstruction or gangrene: Secondary | ICD-10-CM

## 2015-08-04 NOTE — Telephone Encounter (Signed)
Spoke to the patient. Put order in for lab/appt. For 08/05/2015.  Patient states he has a hernia and wondered could he get a referral to see a surgeon??

## 2015-08-05 ENCOUNTER — Other Ambulatory Visit (INDEPENDENT_AMBULATORY_CARE_PROVIDER_SITE_OTHER): Payer: BLUE CROSS/BLUE SHIELD

## 2015-08-05 DIAGNOSIS — Z1159 Encounter for screening for other viral diseases: Secondary | ICD-10-CM

## 2015-08-05 DIAGNOSIS — E669 Obesity, unspecified: Secondary | ICD-10-CM | POA: Diagnosis not present

## 2015-08-05 DIAGNOSIS — E785 Hyperlipidemia, unspecified: Secondary | ICD-10-CM | POA: Diagnosis not present

## 2015-08-05 DIAGNOSIS — E119 Type 2 diabetes mellitus without complications: Secondary | ICD-10-CM | POA: Diagnosis not present

## 2015-08-05 DIAGNOSIS — I10 Essential (primary) hypertension: Secondary | ICD-10-CM | POA: Diagnosis not present

## 2015-08-05 DIAGNOSIS — E1169 Type 2 diabetes mellitus with other specified complication: Secondary | ICD-10-CM

## 2015-08-05 LAB — LIPID PANEL
CHOL/HDL RATIO: 3
Cholesterol: 123 mg/dL (ref 0–200)
HDL: 39.1 mg/dL (ref >39.00)
LDL CALC: 69 mg/dL (ref 0–99)
NONHDL: 83.83
Triglycerides: 74 mg/dL (ref 0.0–149.0)
VLDL: 14.8 mg/dL (ref 0.0–40.0)

## 2015-08-05 LAB — CBC
HEMATOCRIT: 45.1 % (ref 39.0–52.0)
HEMOGLOBIN: 15.2 g/dL (ref 13.0–17.0)
MCHC: 33.7 g/dL (ref 30.0–36.0)
MCV: 94.2 fl (ref 78.0–100.0)
PLATELETS: 207 10*3/uL (ref 150.0–400.0)
RBC: 4.79 Mil/uL (ref 4.22–5.81)
RDW: 12.7 % (ref 11.5–15.5)
WBC: 7.6 10*3/uL (ref 4.0–10.5)

## 2015-08-05 LAB — COMPREHENSIVE METABOLIC PANEL
ALK PHOS: 50 U/L (ref 39–117)
ALT: 34 U/L (ref 0–53)
AST: 22 U/L (ref 0–37)
Albumin: 4.4 g/dL (ref 3.5–5.2)
BUN: 19 mg/dL (ref 6–23)
CHLORIDE: 104 meq/L (ref 96–112)
CO2: 29 meq/L (ref 19–32)
Calcium: 9.3 mg/dL (ref 8.4–10.5)
Creatinine, Ser: 0.86 mg/dL (ref 0.40–1.50)
GFR: 94.89 mL/min (ref >60.00)
GLUCOSE: 134 mg/dL — AB (ref 70–99)
POTASSIUM: 4 meq/L (ref 3.5–5.1)
SODIUM: 139 meq/L (ref 135–145)
TOTAL PROTEIN: 7.3 g/dL (ref 6.0–8.3)
Total Bilirubin: 0.6 mg/dL (ref 0.2–1.2)

## 2015-08-05 LAB — HEMOGLOBIN A1C: HEMOGLOBIN A1C: 7.3 % — AB (ref 4.6–6.5)

## 2015-08-05 LAB — TSH: TSH: 0.87 u[IU]/mL (ref 0.35–4.50)

## 2015-08-06 LAB — HIV ANTIBODY (ROUTINE TESTING W REFLEX): HIV 1&2 Ab, 4th Generation: NONREACTIVE

## 2015-08-06 LAB — HEPATITIS C ANTIBODY: HCV Ab: NEGATIVE

## 2015-08-16 ENCOUNTER — Ambulatory Visit: Payer: Self-pay | Admitting: Surgery

## 2015-08-16 NOTE — H&P (Signed)
History of Present Illness Nathan Hess. Azana Kiesler MD; 08/16/2015 4:53 PM) Patient words: hernia.  The patient is a 65 year old male who presents with an umbilical hernia. Referred by Dr. Abner Greenspan for evaluation of umbilical hernia  This is a 65 year old male with multiple medical problems who presents with a 15 year history of a protruding umbilical hernia. This has become slightly larger. It occasionally causes some discomfort. The patient would like to have this repaired as it seems to protrude through his shirts. He denies any obstructive symptoms. The hernia does seem to get smaller when he is supine. He has not had any imaging of this area.   Other Problems Michel Bickers, LPN; 1/61/0960 4:54 PM) Back Pain Diabetes Mellitus Enlarged Prostate Gastroesophageal Reflux Disease High blood pressure Hypercholesterolemia Migraine Headache Umbilical Hernia Repair  Past Surgical History Michel Bickers, LPN; 0/98/1191 4:78 PM) Colon Polyp Removal - Colonoscopy Knee Surgery Left.  Diagnostic Studies History Michel Bickers, LPN; 2/95/6213 0:86 PM) Colonoscopy within last year  Allergies Michel Bickers, LPN; 5/78/4696 2:95 PM) Chantix Continuing Month Pak *PSYCHOTHERAPEUTIC AND NEUROLOGICAL AGENTS - MISC.*  Medication History Michel Bickers, LPN; 2/84/1324 4:01 PM) Cialis (  Tablet, Oral) Active. Lisinopril (  Tablet, Oral) Active. MetFORMIN HCl (  Tablet, Oral) Active. Rosuvastatin Calcium (  Tablet, Oral) Active. Aspirin (  Tablet, Oral) Active. Effexor (  Tablet, Oral) Active. Medications Reconciled  Social History Michel Bickers, LPN; 0/27/2536 6:44 PM) Alcohol use Occasional alcohol use. Caffeine use Coffee. No drug use Tobacco use Current every day smoker.     Review of Systems Tresa Endo Dockery LPN; 0/34/7425 9:56 PM) General Not Present- Appetite Loss, Chills, Fatigue, Fever, Night Sweats, Weight Gain and Weight Loss. Skin Not  Present- Change in Wart/Mole, Dryness, Hives, Jaundice, New Lesions, Non-Healing Wounds, Rash and Ulcer. HEENT Present- Wears glasses/contact lenses. Not Present- Earache, Hearing Loss, Hoarseness, Nose Bleed, Oral Ulcers, Ringing in the Ears, Seasonal Allergies, Sinus Pain, Sore Throat, Visual Disturbances and Yellow Eyes. Respiratory Not Present- Bloody sputum, Chronic Cough, Difficulty Breathing, Snoring and Wheezing. Cardiovascular Not Present- Chest Pain, Difficulty Breathing Lying Down, Leg Cramps, Palpitations, Rapid Heart Rate, Shortness of Breath and Swelling of Extremities. Gastrointestinal Not Present- Abdominal Pain, Bloating, Bloody Stool, Change in Bowel Habits, Chronic diarrhea, Constipation, Difficulty Swallowing, Excessive gas, Gets full quickly at meals, Hemorrhoids, Indigestion, Nausea, Rectal Pain and Vomiting. Male Genitourinary Present- Frequency. Not Present- Blood in Urine, Change in Urinary Stream, Impotence, Nocturia, Painful Urination, Urgency and Urine Leakage. Musculoskeletal Not Present- Back Pain, Joint Pain, Joint Stiffness, Muscle Pain, Muscle Weakness and Swelling of Extremities. Neurological Not Present- Decreased Memory, Fainting, Headaches, Numbness, Seizures, Tingling, Tremor, Trouble walking and Weakness. Psychiatric Not Present- Anxiety, Bipolar, Change in Sleep Pattern, Depression, Fearful and Frequent crying. Endocrine Present- New Diabetes. Not Present- Cold Intolerance, Excessive Hunger, Hair Changes, Heat Intolerance and Hot flashes. Hematology Not Present- Easy Bruising, Excessive bleeding, Gland problems, HIV and Persistent Infections.  Vitals Tresa Endo Dockery LPN; 3/87/5643 3:29 PM) 08/16/2015 2:51 PM Weight: 232.8 lb Height: 71in Body Surface Area: 2.25 m Body Mass Index: 32.47 kg/m  Temp.: 98.32F(Oral)  Pulse: 86 (Regular)  BP: 138/88 (Sitting, Left Arm, Standard)      Physical Exam Molli Hazard K. Wylan Gentzler MD; 08/16/2015 4:53 PM)  The  physical exam findings are as follows: Note:WDWN in NAD HEENT: EOMI, sclera anicteric Neck: No masses, no thyromegaly Lungs: CTA bilaterally; normal respiratory effort CV: Regular rate and rhythm; no murmurs Abd: +bowel sounds, soft, non-tender, protruding 4 cm umbilical hernia at upper edge of umbilicus - partially  reducible when supine; slight pink discoloration to overlying skin  Ext: Well-perfused; no edema Skin: Warm, dry; no sign of jaundice    Assessment & Plan Molli Hazard K. Guillaume Weninger MD; 08/16/2015 3:02 PM)  UMBILICAL HERNIA WITHOUT OBSTRUCTION OR GANGRENE (K42.9)  Current Plans Schedule for Surgery - Umbilical hernia repair with mesh. The surgical procedure has been discussed with the patient. Potential risks, benefits, alternative treatments, and expected outcomes have been explained. All of the patient's questions at this time have been answered. The likelihood of reaching the patient's treatment goal is good. The patient understand the proposed surgical procedure and wishes to proceed. Pt Education - Pamphlet Given - Hernia Surgery: discussed with patient and provided information  Nathan Hess. Corliss Skains, MD, [facility]  General/ Trauma Surgery  08/16/2015 4:54 PM

## 2015-09-12 ENCOUNTER — Other Ambulatory Visit (INDEPENDENT_AMBULATORY_CARE_PROVIDER_SITE_OTHER): Payer: BLUE CROSS/BLUE SHIELD

## 2015-09-12 DIAGNOSIS — I10 Essential (primary) hypertension: Secondary | ICD-10-CM

## 2015-09-12 LAB — CBC WITH DIFFERENTIAL/PLATELET
BASOS PCT: 0.8 % (ref 0.0–3.0)
Basophils Absolute: 0.1 10*3/uL (ref 0.0–0.1)
EOS ABS: 0.2 10*3/uL (ref 0.0–0.7)
EOS PCT: 2.1 % (ref 0.0–5.0)
HCT: 44.6 % (ref 39.0–52.0)
HEMOGLOBIN: 15.4 g/dL (ref 13.0–17.0)
LYMPHS ABS: 2 10*3/uL (ref 0.7–4.0)
Lymphocytes Relative: 24.7 % (ref 12.0–46.0)
MCHC: 34.5 g/dL (ref 30.0–36.0)
MCV: 91.8 fl (ref 78.0–100.0)
MONO ABS: 0.7 10*3/uL (ref 0.1–1.0)
Monocytes Relative: 8.8 % (ref 3.0–12.0)
NEUTROS PCT: 63.6 % (ref 43.0–77.0)
Neutro Abs: 5.2 10*3/uL (ref 1.4–7.7)
PLATELETS: 235 10*3/uL (ref 150.0–400.0)
RBC: 4.86 Mil/uL (ref 4.22–5.81)
RDW: 13.2 % (ref 11.5–15.5)
WBC: 8.1 10*3/uL (ref 4.0–10.5)

## 2015-09-12 LAB — LIPID PANEL
CHOLESTEROL: 173 mg/dL (ref 0–200)
HDL: 40.8 mg/dL (ref >39.00)
LDL CALC: 107 mg/dL — AB (ref 0–99)
NonHDL: 132.14
TRIGLYCERIDES: 125 mg/dL (ref 0.0–149.0)
Total CHOL/HDL Ratio: 4
VLDL: 25 mg/dL (ref 0.0–40.0)

## 2015-09-12 LAB — COMPREHENSIVE METABOLIC PANEL
ALBUMIN: 4.6 g/dL (ref 3.5–5.2)
ALK PHOS: 55 U/L (ref 39–117)
ALT: 34 U/L (ref 0–53)
AST: 20 U/L (ref 0–37)
BUN: 20 mg/dL (ref 6–23)
CHLORIDE: 103 meq/L (ref 96–112)
CO2: 28 mEq/L (ref 19–32)
CREATININE: 0.86 mg/dL (ref 0.40–1.50)
Calcium: 9.4 mg/dL (ref 8.4–10.5)
GFR: 94.86 mL/min (ref >60.00)
GLUCOSE: 140 mg/dL — AB (ref 70–99)
Potassium: 3.9 mEq/L (ref 3.5–5.1)
SODIUM: 139 meq/L (ref 135–145)
TOTAL PROTEIN: 7.6 g/dL (ref 6.0–8.3)
Total Bilirubin: 0.5 mg/dL (ref 0.2–1.2)

## 2015-09-12 LAB — HEMOGLOBIN A1C: HEMOGLOBIN A1C: 7.2 % — AB (ref 4.6–6.5)

## 2015-09-22 ENCOUNTER — Encounter: Payer: Self-pay | Admitting: Family Medicine

## 2015-09-22 ENCOUNTER — Ambulatory Visit (INDEPENDENT_AMBULATORY_CARE_PROVIDER_SITE_OTHER): Payer: BLUE CROSS/BLUE SHIELD | Admitting: Family Medicine

## 2015-09-22 VITALS — BP 132/82 | HR 80 | Temp 98.4°F | Ht 71.0 in | Wt 226.2 lb

## 2015-09-22 DIAGNOSIS — E785 Hyperlipidemia, unspecified: Secondary | ICD-10-CM

## 2015-09-22 DIAGNOSIS — E669 Obesity, unspecified: Secondary | ICD-10-CM

## 2015-09-22 DIAGNOSIS — E119 Type 2 diabetes mellitus without complications: Secondary | ICD-10-CM | POA: Diagnosis not present

## 2015-09-22 DIAGNOSIS — I10 Essential (primary) hypertension: Secondary | ICD-10-CM

## 2015-09-22 DIAGNOSIS — F32A Depression, unspecified: Secondary | ICD-10-CM

## 2015-09-22 DIAGNOSIS — F17200 Nicotine dependence, unspecified, uncomplicated: Secondary | ICD-10-CM

## 2015-09-22 DIAGNOSIS — E1169 Type 2 diabetes mellitus with other specified complication: Secondary | ICD-10-CM

## 2015-09-22 DIAGNOSIS — F329 Major depressive disorder, single episode, unspecified: Secondary | ICD-10-CM | POA: Diagnosis not present

## 2015-09-22 MED ORDER — VENLAFAXINE HCL 75 MG PO TABS: 37.5000 mg | ORAL_TABLET | Freq: Every day | ORAL | Status: DC

## 2015-09-22 NOTE — Assessment & Plan Note (Addendum)
Has been cutting down on Venlafaxine to once daily and is going to cut down to 1/2 tab daily then 1/2 tab every other day and quit. So far no spike in depression. He will let us know if this changes.

## 2015-09-22 NOTE — Patient Instructions (Addendum)
NEEDS PHONE NUMBER FOR HELP WITH MYCHART SIGN   Tobacco Use Disorder Tobacco use disorder (TUD) is a mental disorder. It is the long-term use of tobacco in spite of related health problems or difficulty with normal life activities. Tobacco is most commonly smoked as cigarettes and less commonly as cigars or pipes. Smokeless chewing tobacco and snuff are also popular. People with TUD get a feeling of extreme pleasure (euphoria) from using tobacco and have a desire to use it again and again. Repeated use of tobacco can cause problems. The addictive effects of tobacco are due mainly tothe ingredient nicotine. Nicotine also causes a rush of adrenaline (epinephrine) in the body. This leads to increased blood pressure, heart rate, and breathing rate. These changes may cause problems for people with high blood pressure, weak hearts, or lung disease. High doses of nicotine in children and pets can lead to seizures and death.  Tobacco contains a number of other unsafe chemicals. These chemicals are especially harmful when inhaled as smoke and can damage almost every organ in the body. Smokers live shorter lives than nonsmokers and are at risk of dying from a number of diseases and cancers. Tobacco smoke can also cause health problems for nonsmokers (due to inhaling secondhand smoke). Smoking is also a fire hazard.  TUD usually starts in the late teenage years and is most common in young adults between the ages of 68 and 25 years. People who start smoking earlier in life are more likely to continue smoking as adults. TUD is somewhat more common in men than women. People with TUD are at higher risk for using alcohol and other drugs of abuse. RISK FACTORS Risk factors for TUD include:   Having family members with the disorder.  Being around people who use tobacco.  Having an existing mental health issue such as schizophrenia, depression, bipolar disorder, ADHD, or posttraumatic stress disorder (PTSD). SIGNS AND  SYMPTOMS  People with tobacco use disorder have two or more of the following signs and symptoms within 12 months:   Use of more tobacco over a longer period than intended.   Not able to cut down or control tobacco use.   A lot of time spent obtaining or using tobacco.   Strong desire or urge to use tobacco (craving). Cravings may last for 6 months or longer after quitting.  Use of tobacco even when use leads to major problems at work, school, or home.   Use of tobacco even when use leads to relationship problems.   Giving up or cutting down on important life activities because of tobacco use.   Repeatedly using tobacco in situations where it puts you or others in physical danger, like smoking in bed.   Use of tobacco even when it is known that a physical or mental problem is likely related to tobacco use.   Physical problems are numerous and may include chronic bronchitis, emphysema, lung and other cancers, gum disease, high blood pressure, heart disease, and stroke.   Mental problems caused by tobacco may include difficulty sleeping and anxiety.  Need to use greater amounts of tobacco to get the same effect. This means you have developed a tolerance.   Withdrawal symptoms as a result of stopping or rapidly cutting back use. These symptoms may last a month or more after quitting and include the following:   Depressed, anxious, or irritable mood.   Difficulty concentrating.   Increased appetite.  Restlessness or trouble sleeping.   Use of tobacco to avoid  withdrawal symptoms. DIAGNOSIS  Tobacco use disorder is diagnosed by your health care provider. A diagnosis may be made by:  Your health care provider asking questions about your tobacco use and any problems it may be causing.  A physical exam.  Lab tests.  You may be referred to a mental health professional or addiction specialist. The severity of tobacco use disorder depends on the number of signs and  symptoms you have:   Mild--Two or three symptoms.  Moderate--Four or five symptoms.   Severe--Six or more symptoms.  TREATMENT  Many people with tobacco use disorder are unable to quit on their own and need help. Treatment options include the following:  Nicotine replacement therapy (NRT). NRT provides nicotine without the other harmful chemicals in tobacco. NRT gradually lowers the dosage of nicotine in the body and reduces withdrawal symptoms. NRT is available in over-the-counter forms (gum, lozenges, and skin patches) as well as prescription forms (mouth inhaler and nasal spray).  Medicines.This may include:  Antidepressant medicine that may reduce nicotine cravings.  A medicine that acts on nicotine receptors in the brain to reduce cravings and withdrawal symptoms. It may also block the effects of tobacco in people with TUD who relapse.  Counseling or talk therapy. A form of talk therapy called behavioral therapy is commonly used to treat people with TUD. Behavioral therapy looks at triggers for tobacco use, how to avoid them, and how to cope with cravings. It is most effective in person or by phone but is also available in self-help forms (books and Internet websites).  Support groups. These provide emotional support, advice, and guidance for quitting tobacco. The most effective treatment for TUD is usually a combination of medicine, talk therapy, and support groups. HOME CARE INSTRUCTIONS  Keep all follow-up visits as directed by your health care provider. This is important.  Take medicines only as directed by your health care provider.  Check with your health care provider before starting new prescription or over-the-counter medicines. SEEK MEDICAL CARE IF:  You are not able to take your medicines as prescribed.  Treatment is not helping your TUD and your symptoms get worse. SEEK IMMEDIATE MEDICAL CARE IF:  You have serious thoughts about hurting yourself or  others.  You have trouble breathing, chest pain, sudden weakness, or sudden numbness in part of your body.   This information is not intended to replace advice given to you by your health care provider. Make sure you discuss any questions you have with your health care provider.   Document Released: 03/14/2004 Document Revised: 07/30/2014 Document Reviewed: 09/04/2013 Elsevier Interactive Patient Education [employer]

## 2015-09-22 NOTE — Assessment & Plan Note (Addendum)
Still smoking same amount. Will retry Chantix.next week

## 2015-09-22 NOTE — Progress Notes (Signed)
Pre visit review using our clinic review tool, if applicable. No additional management support is needed unless otherwise documented below in the visit note. 

## 2015-09-22 NOTE — Assessment & Plan Note (Signed)
Tolerating statin, encouraged heart healthy diet, avoid trans fats, minimize simple carbs and saturated fats. Increase exercise as tolerated 

## 2015-09-22 NOTE — Assessment & Plan Note (Signed)
Has cut out most carbohydrates, his hgba1c is improving. Continue same

## 2015-09-28 ENCOUNTER — Other Ambulatory Visit: Payer: Self-pay | Admitting: Family Medicine

## 2015-09-28 NOTE — Telephone Encounter (Signed)
Advise on this refill as these instructions are not on current list.

## 2015-10-02 NOTE — Assessment & Plan Note (Signed)
Well controlled, no changes to meds. Encouraged heart healthy diet such as the DASH diet and exercise as tolerated.  °

## 2015-10-02 NOTE — Progress Notes (Signed)
Subjective:    Patient ID: Nathan Hess, male    DOB: Oct 09, 1950, 65 y.o.   MRN: [id number]  Chief Complaint  Patient presents with  . Follow-up    HPI Patient is in today for follow up. Is doing well. No recent illness. Has been cutting down on Effexor with good results. No concerns have developed. No recent illness. Denies CP/palp/SOB/HA/congestion/fevers/GI or GU c/o. Taking meds as prescribed  Past Medical History  Diagnosis Date  . Migraine headache 08/03/2012  . Diabetes mellitus type 2 in obese (HCC) 05/25/2012  . Hyperlipidemia 05/25/2012  . Tobacco use disorder 10/30/2012  . Other malaise and fatigue 11/01/2012  . Left knee DJD   . Unspecified essential hypertension 05/25/2012    Does not see a cardiologist  . Depression   . Enlarged prostate   . Preventative health care 09/22/2014  . History of colonic polyps 06/26/2015    3rd colonoscopy in 2016 with Dr Grayling [place] in [place]    Past Surgical History  Procedure Laterality Date  . Left hand surgery    . Partial knee arthroplasty Left 04/06/2013    Procedure: UNICOMPARTMENTAL LEFT KNEE;  Surgeon: Nilda Simmerobert A Wainer, MD;  [facility];  Service: Orthopedics;  Laterality: Left;  . Umbilical hernia repair  2017    w/ small ventral ex mesh    Family History  Problem Relation Age of Onset  . Alzheimer's disease Mother   . Hypertension Father   . Diabetes Brother     ?  Marland Kitchen. Alcohol abuse Maternal Grandfather   . Cancer Paternal Grandmother   . Heart disease Paternal Grandfather     MI    Social History   Social History  . Marital Status: Married    Spouse Name: N/A  . Number of Children: N/A  . Years of Education: N/A   Occupational History  . Not on file.   Social History Main Topics  . Smoking status: Current Every Day Smoker -- 1.00 packs/day for 15 years    Types: Cigarettes  . Smokeless tobacco: Not on file  . Alcohol Use: Yes     Comment: 1 beer about every 2 months   . Drug Use: No  . Sexual  Activity: Yes    Birth Control/ Protection: Condom   Other Topics Concern  . Not on file   Social History Narrative    Outpatient Prescriptions Prior to Visit  Medication Sig Dispense Refill  . aspirin 325 MG tablet Take 1 tablet (325 mg total) by mouth daily. 30 tablet 0  . lisinopril (PRINIVIL,ZESTRIL) 10 MG tablet Take 1 tablet (10 mg total) by mouth daily. 90 tablet 3  . metFORMIN (GLUCOPHAGE) 500 MG tablet Take 1 tablet (500 mg total) by mouth daily with breakfast. 90 tablet 3  . rosuvastatin (CRESTOR) 20 MG tablet Take 1 tablet (20 mg total) by mouth daily. 90 tablet 3  . tadalafil (CIALIS) 20 MG tablet Take 1 tablet (20 mg total) by mouth daily as needed for erectile dysfunction. 9 tablet 5  . triamcinolone cream (KENALOG) 0.1 % Apply 1 application topically 2 (two) times daily. 80 g 1  . varenicline (CHANTIX CONTINUING MONTH PAK) 1 MG tablet Take 1 tablet (1 mg total) by mouth 2 (two) times daily. 60 tablet 4  . varenicline (CHANTIX STARTING MONTH PAK) 0.5 MG X 11 & 1 MG X 42 tablet Take one 0.5 mg tablet by mouth once daily for 3 days, then increase to one 0.5 mg tablet  twice daily for 4 days, then increase to one 1 mg tablet twice daily. 53 tablet 0  . venlafaxine (EFFEXOR) 75 MG tablet TAKE 1 TABLET (75 MG TOTAL) BY MOUTH 3 (THREE) TIMES DAILY WITH MEALS. (Patient taking differently: Take 75 mg by mouth daily. ) 270 tablet 3   No facility-administered medications prior to visit.    Allergies  Allergen Reactions  . Chantix [Varenicline] Other (See Comments)    depression    Review of Systems  Constitutional: Negative for fever and malaise/fatigue.  HENT: Negative for congestion.   Eyes: Negative for blurred vision.  Respiratory: Negative for shortness of breath.   Cardiovascular: Negative for chest pain, palpitations and leg swelling.  Gastrointestinal: Negative for nausea, abdominal pain and blood in stool.  Genitourinary: Negative for dysuria and frequency.    Musculoskeletal: Negative for falls.  Skin: Negative for rash.  Neurological: Negative for dizziness, loss of consciousness and headaches.  Endo/Heme/Allergies: Negative for environmental allergies.  Psychiatric/Behavioral: Negative for depression. The patient is not nervous/anxious.        Objective:    Physical Exam  Constitutional: He is oriented to person, place, and time. He appears well-developed and well-nourished. No distress.  HENT:  Head: Normocephalic and atraumatic.  Nose: Nose normal.  Eyes: Right eye exhibits no discharge. Left eye exhibits no discharge.  Neck: Normal range of motion. Neck supple.  Cardiovascular: Normal rate and regular rhythm.   No murmur heard. Pulmonary/Chest: Effort normal and breath sounds normal.  Abdominal: Soft. Bowel sounds are normal. There is no tenderness.  Musculoskeletal: He exhibits no edema.  Neurological: He is alert and oriented to person, place, and time.  Skin: Skin is warm and dry.  Psychiatric: He has a normal mood and affect.  Nursing note and vitals reviewed.   BP 132/82 mmHg  Pulse 80  Temp(Src) 98.4 F (36.9 C) (Oral)  Ht  (1.803 m)  Wt 226 lb 4 oz (102.626 kg)  BMI 31.57 kg/m2  SpO2 96% Wt Readings from Last 3 Encounters:  09/22/15 226 lb 4 oz (102.626 kg)  06/24/15 240 lb 2 oz (108.92 kg)  03/29/15 234 lb (106.142 kg)     Lab Results  Component Value Date   WBC 8.1 09/12/2015   HGB 15.4 09/12/2015   HCT 44.6 09/12/2015   PLT 235.0 09/12/2015   GLUCOSE 140* 09/12/2015   CHOL 173 09/12/2015   TRIG 125.0 09/12/2015   HDL 40.80 09/12/2015   LDLCALC 107* 09/12/2015   ALT 34 09/12/2015   AST 20 09/12/2015   NA 139 09/12/2015   K 3.9 09/12/2015   CL 103 09/12/2015   CREATININE 0.86 09/12/2015   BUN 20 09/12/2015   CO2 28 09/12/2015   TSH 0.87 08/05/2015   PSA 3.75 04/23/2014   INR 1.00 03/30/2013   HGBA1C 7.2* 09/12/2015   MICROALBUR 0.50 07/28/2012    Lab Results  Component Value Date    TSH 0.87 08/05/2015   Lab Results  Component Value Date   WBC 8.1 09/12/2015   HGB 15.4 09/12/2015   HCT 44.6 09/12/2015   MCV 91.8 09/12/2015   PLT 235.0 09/12/2015   Lab Results  Component Value Date   NA 139 09/12/2015   K 3.9 09/12/2015   CO2 28 09/12/2015   GLUCOSE 140* 09/12/2015   BUN 20 09/12/2015   CREATININE 0.86 09/12/2015   BILITOT 0.5 09/12/2015   ALKPHOS 55 09/12/2015   AST 20 09/12/2015   ALT 34 09/12/2015   PROT  7.6 09/12/2015   ALBUMIN 4.6 09/12/2015   CALCIUM 9.4 09/12/2015   GFR 94.86 09/12/2015   Lab Results  Component Value Date   CHOL 173 09/12/2015   Lab Results  Component Value Date   HDL 40.80 09/12/2015   Lab Results  Component Value Date   LDLCALC 107* 09/12/2015   Lab Results  Component Value Date   TRIG 125.0 09/12/2015   Lab Results  Component Value Date   CHOLHDL 4 09/12/2015   Lab Results  Component Value Date   HGBA1C 7.2* 09/12/2015       Assessment & Plan:   Problem List Items Addressed This Visit    Depression    Has been cutting down on Venlafaxine to once daily and is going to cut down to 1/2 tab daily then 1/2 tab every other day and quit. So far no spike in depression. He will let us know if this changes.      Relevant Medications   venlafaxine (EFFEXOR) 75 MG tablet   Diabetes mellitus type 2 in obese (HCC) - Primary    Has cut out most carbohydrates, his hgba1c is improving. Continue same      Relevant Orders   TSH   CBC   Hemoglobin A1c   Comprehensive metabolic panel   Lipid panel   Microalbumin / creatinine urine ratio   Essential hypertension    Well controlled, no changes to meds. Encouraged heart healthy diet such as the DASH diet and exercise as tolerated.       Relevant Orders   TSH   CBC   Hemoglobin A1c   Comprehensive metabolic panel   Lipid panel   Microalbumin / creatinine urine ratio   Hyperlipidemia    Tolerating statin, encouraged heart healthy diet, avoid trans fats,  minimize simple carbs and saturated fats. Increase exercise as tolerated      Relevant Orders   TSH   CBC   Hemoglobin A1c   Comprehensive metabolic panel   Lipid panel   Microalbumin / creatinine urine ratio   Tobacco use disorder    Still smoking same amount. Will retry Chantix.next week         I have discontinued Mr. Nuno's varenicline and varenicline. I have also changed his venlafaxine. Additionally, I am having him maintain his aspirin, triamcinolone cream, metFORMIN, rosuvastatin, lisinopril, and tadalafil.  Meds ordered this encounter  Medications  . venlafaxine (EFFEXOR) 75 MG tablet    Sig: Take 0.5-1 tablets (37.5-75 mg total) by mouth daily.    Dispense:  30 tablet    Refill:  0     [name], MD

## 2015-11-14 DIAGNOSIS — D1801 Hemangioma of skin and subcutaneous tissue: Secondary | ICD-10-CM | POA: Diagnosis not present

## 2015-11-14 DIAGNOSIS — L820 Inflamed seborrheic keratosis: Secondary | ICD-10-CM | POA: Diagnosis not present

## 2015-11-14 DIAGNOSIS — L814 Other melanin hyperpigmentation: Secondary | ICD-10-CM | POA: Diagnosis not present

## 2015-11-14 DIAGNOSIS — D225 Melanocytic nevi of trunk: Secondary | ICD-10-CM | POA: Diagnosis not present

## 2015-11-14 DIAGNOSIS — L821 Other seborrheic keratosis: Secondary | ICD-10-CM | POA: Diagnosis not present

## 2015-12-13 DIAGNOSIS — H35361 Drusen (degenerative) of macula, right eye: Secondary | ICD-10-CM | POA: Diagnosis not present

## 2015-12-13 DIAGNOSIS — H3530 Unspecified macular degeneration: Secondary | ICD-10-CM | POA: Diagnosis not present

## 2015-12-13 DIAGNOSIS — E119 Type 2 diabetes mellitus without complications: Secondary | ICD-10-CM | POA: Diagnosis not present

## 2015-12-13 LAB — HM DIABETES EYE EXAM

## 2015-12-20 ENCOUNTER — Other Ambulatory Visit: Payer: Self-pay | Admitting: Family Medicine

## 2015-12-20 ENCOUNTER — Other Ambulatory Visit (INDEPENDENT_AMBULATORY_CARE_PROVIDER_SITE_OTHER): Payer: BLUE CROSS/BLUE SHIELD

## 2015-12-20 DIAGNOSIS — E785 Hyperlipidemia, unspecified: Secondary | ICD-10-CM

## 2015-12-20 DIAGNOSIS — E669 Obesity, unspecified: Secondary | ICD-10-CM | POA: Diagnosis not present

## 2015-12-20 DIAGNOSIS — E119 Type 2 diabetes mellitus without complications: Secondary | ICD-10-CM

## 2015-12-20 DIAGNOSIS — I10 Essential (primary) hypertension: Secondary | ICD-10-CM | POA: Diagnosis not present

## 2015-12-20 DIAGNOSIS — E1169 Type 2 diabetes mellitus with other specified complication: Secondary | ICD-10-CM

## 2015-12-20 LAB — COMPREHENSIVE METABOLIC PANEL
ALBUMIN: 4.4 g/dL (ref 3.5–5.2)
ALK PHOS: 59 U/L (ref 39–117)
ALT: 23 U/L (ref 0–53)
AST: 16 U/L (ref 0–37)
BILIRUBIN TOTAL: 0.4 mg/dL (ref 0.2–1.2)
BUN: 16 mg/dL (ref 6–23)
CALCIUM: 9.1 mg/dL (ref 8.4–10.5)
CO2: 27 mEq/L (ref 19–32)
Chloride: 106 mEq/L (ref 96–112)
Creatinine, Ser: 0.77 mg/dL (ref 0.40–1.50)
GFR: 107.67 mL/min (ref >60.00)
GLUCOSE: 114 mg/dL — AB (ref 70–99)
POTASSIUM: 4.3 meq/L (ref 3.5–5.1)
Sodium: 138 mEq/L (ref 135–145)
TOTAL PROTEIN: 6.9 g/dL (ref 6.0–8.3)

## 2015-12-20 LAB — CBC
HEMATOCRIT: 42.5 % (ref 39.0–52.0)
Hemoglobin: 14.6 g/dL (ref 13.0–17.0)
MCHC: 34.4 g/dL (ref 30.0–36.0)
MCV: 93.3 fl (ref 78.0–100.0)
Platelets: 222 10*3/uL (ref 150.0–400.0)
RBC: 4.55 Mil/uL (ref 4.22–5.81)
RDW: 12.8 % (ref 11.5–15.5)
WBC: 7.3 10*3/uL (ref 4.0–10.5)

## 2015-12-20 LAB — TSH: TSH: 1.22 u[IU]/mL (ref 0.35–4.50)

## 2015-12-20 LAB — MICROALBUMIN / CREATININE URINE RATIO
Creatinine,U: 115.4 mg/dL
MICROALB/CREAT RATIO: 0.8 mg/g (ref 0.0–30.0)
Microalb, Ur: 0.9 mg/dL (ref 0.0–1.9)

## 2015-12-20 LAB — LIPID PANEL
CHOLESTEROL: 172 mg/dL (ref 0–200)
HDL: 35.4 mg/dL — AB (ref >39.00)
LDL Cholesterol: 107 mg/dL — ABNORMAL HIGH (ref 0–99)
NONHDL: 137.02
Total CHOL/HDL Ratio: 5
Triglycerides: 150 mg/dL — ABNORMAL HIGH (ref 0.0–149.0)
VLDL: 30 mg/dL (ref 0.0–40.0)

## 2015-12-20 LAB — HEMOGLOBIN A1C: HEMOGLOBIN A1C: 6.7 % — AB (ref 4.6–6.5)

## 2015-12-20 NOTE — Telephone Encounter (Signed)
Updated medication list. Patient is no longer on Effexor and updated list.

## 2015-12-27 ENCOUNTER — Encounter: Payer: Self-pay | Admitting: Family Medicine

## 2015-12-27 ENCOUNTER — Telehealth: Payer: Self-pay | Admitting: Family Medicine

## 2015-12-27 ENCOUNTER — Ambulatory Visit (INDEPENDENT_AMBULATORY_CARE_PROVIDER_SITE_OTHER): Payer: BLUE CROSS/BLUE SHIELD | Admitting: Family Medicine

## 2015-12-27 VITALS — BP 122/78 | HR 75 | Temp 98.5°F | Ht 71.0 in | Wt 228.2 lb

## 2015-12-27 DIAGNOSIS — M545 Low back pain: Secondary | ICD-10-CM

## 2015-12-27 DIAGNOSIS — F32A Depression, unspecified: Secondary | ICD-10-CM

## 2015-12-27 DIAGNOSIS — F17200 Nicotine dependence, unspecified, uncomplicated: Secondary | ICD-10-CM | POA: Diagnosis not present

## 2015-12-27 DIAGNOSIS — F329 Major depressive disorder, single episode, unspecified: Secondary | ICD-10-CM

## 2015-12-27 DIAGNOSIS — E1169 Type 2 diabetes mellitus with other specified complication: Secondary | ICD-10-CM

## 2015-12-27 DIAGNOSIS — I10 Essential (primary) hypertension: Secondary | ICD-10-CM | POA: Diagnosis not present

## 2015-12-27 DIAGNOSIS — E785 Hyperlipidemia, unspecified: Secondary | ICD-10-CM

## 2015-12-27 DIAGNOSIS — E669 Obesity, unspecified: Secondary | ICD-10-CM

## 2015-12-27 DIAGNOSIS — Z23 Encounter for immunization: Secondary | ICD-10-CM

## 2015-12-27 DIAGNOSIS — E119 Type 2 diabetes mellitus without complications: Secondary | ICD-10-CM

## 2015-12-27 LAB — HM DIABETES EYE EXAM

## 2015-12-27 MED ORDER — ROSUVASTATIN CALCIUM 20 MG PO TABS: 20.0000 mg | ORAL_TABLET | Freq: Every day | ORAL | Status: DC

## 2015-12-27 MED ORDER — LISINOPRIL 5 MG PO TABS: 5.0000 mg | ORAL_TABLET | Freq: Every day | ORAL | Status: DC

## 2015-12-27 MED ORDER — LISINOPRIL 10 MG PO TABS: 10.0000 mg | ORAL_TABLET | Freq: Every day | ORAL | Status: DC

## 2015-12-27 MED ORDER — METFORMIN HCL 500 MG PO TABS: 500.0000 mg | ORAL_TABLET | Freq: Every day | ORAL | Status: DC

## 2015-12-27 NOTE — Assessment & Plan Note (Signed)
hgba1c acceptable, minimize simple carbs. Increase exercise as tolerated. Continue current meds 

## 2015-12-27 NOTE — Telephone Encounter (Signed)
Faxed for medical records    [facility] - [phone]   And also [facility] - [phone]

## 2015-12-27 NOTE — Progress Notes (Signed)
Pre visit review using our clinic review tool, if applicable. No additional management support is needed unless otherwise documented below in the visit note. 

## 2015-12-27 NOTE — Assessment & Plan Note (Addendum)
Encouraged complete cessation. Discussed need to quit as relates to risk of numerous cancers, cardiac and pulmonary disease as well as neurologic complications. Counseled for greater than 3 minutes Did not tolerate Chantix. Vaporizer was not helpful in past but is going to try again

## 2015-12-27 NOTE — Assessment & Plan Note (Signed)
Tolerating statin, encouraged heart healthy diet, avoid trans fats, minimize simple carbs and saturated fats. Increase exercise as tolerated 

## 2015-12-27 NOTE — Patient Instructions (Signed)
Tobacco Use Disorder Tobacco use disorder (TUD) is a mental disorder. It is the long-term use of tobacco in spite of related health problems or difficulty with normal life activities. Tobacco is most commonly smoked as cigarettes and less commonly as cigars or pipes. Smokeless chewing tobacco and snuff are also popular. People with TUD get a feeling of extreme pleasure (euphoria) from using tobacco and have a desire to use it again and again. Repeated use of tobacco can cause problems. The addictive effects of tobacco are due mainly tothe ingredient nicotine. Nicotine also causes a rush of adrenaline (epinephrine) in the body. This leads to increased blood pressure, heart rate, and breathing rate. These changes may cause problems for people with high blood pressure, weak hearts, or lung disease. High doses of nicotine in children and pets can lead to seizures and death.  Tobacco contains a number of other unsafe chemicals. These chemicals are especially harmful when inhaled as smoke and can damage almost every organ in the body. Smokers live shorter lives than nonsmokers and are at risk of dying from a number of diseases and cancers. Tobacco smoke can also cause health problems for nonsmokers (due to inhaling secondhand smoke). Smoking is also a fire hazard.  TUD usually starts in the late teenage years and is most common in young adults between the ages of 18 and 25 years. People who start smoking earlier in life are more likely to continue smoking as adults. TUD is somewhat more common in men than women. People with TUD are at higher risk for using alcohol and other drugs of abuse. RISK FACTORS Risk factors for TUD include:   Having family members with the disorder.  Being around people who use tobacco.  Having an existing mental health issue such as schizophrenia, depression, bipolar disorder, ADHD, or posttraumatic stress disorder (PTSD). SIGNS AND SYMPTOMS  People with tobacco use disorder have  two or more of the following signs and symptoms within 12 months:   Use of more tobacco over a longer period than intended.   Not able to cut down or control tobacco use.   A lot of time spent obtaining or using tobacco.   Strong desire or urge to use tobacco (craving). Cravings may last for 6 months or longer after quitting.  Use of tobacco even when use leads to major problems at work, school, or home.   Use of tobacco even when use leads to relationship problems.   Giving up or cutting down on important life activities because of tobacco use.   Repeatedly using tobacco in situations where it puts you or others in physical danger, like smoking in bed.   Use of tobacco even when it is known that a physical or mental problem is likely related to tobacco use.   Physical problems are numerous and may include chronic bronchitis, emphysema, lung and other cancers, gum disease, high blood pressure, heart disease, and stroke.   Mental problems caused by tobacco may include difficulty sleeping and anxiety.  Need to use greater amounts of tobacco to get the same effect. This means you have developed a tolerance.   Withdrawal symptoms as a result of stopping or rapidly cutting back use. These symptoms may last a month or more after quitting and include the following:   Depressed, anxious, or irritable mood.   Difficulty concentrating.   Increased appetite.  Restlessness or trouble sleeping.   Use of tobacco to avoid withdrawal symptoms. DIAGNOSIS  Tobacco use disorder is diagnosed by   your health care provider. A diagnosis may be made by:  Your health care provider asking questions about your tobacco use and any problems it may be causing.  A physical exam.  Lab tests.  You may be referred to a mental health professional or addiction specialist. The severity of tobacco use disorder depends on the number of signs and symptoms you have:   Mild--Two or three  symptoms.  Moderate--Four or five symptoms.   Severe--Six or more symptoms.  TREATMENT  Many people with tobacco use disorder are unable to quit on their own and need help. Treatment options include the following:  Nicotine replacement therapy (NRT). NRT provides nicotine without the other harmful chemicals in tobacco. NRT gradually lowers the dosage of nicotine in the body and reduces withdrawal symptoms. NRT is available in over-the-counter forms (gum, lozenges, and skin patches) as well as prescription forms (mouth inhaler and nasal spray).  Medicines.This may include:  Antidepressant medicine that may reduce nicotine cravings.  A medicine that acts on nicotine receptors in the brain to reduce cravings and withdrawal symptoms. It may also block the effects of tobacco in people with TUD who relapse.  Counseling or talk therapy. A form of talk therapy called behavioral therapy is commonly used to treat people with TUD. Behavioral therapy looks at triggers for tobacco use, how to avoid them, and how to cope with cravings. It is most effective in person or by phone but is also available in self-help forms (books and Internet websites).  Support groups. These provide emotional support, advice, and guidance for quitting tobacco. The most effective treatment for TUD is usually a combination of medicine, talk therapy, and support groups. HOME CARE INSTRUCTIONS  Keep all follow-up visits as directed by your health care provider. This is important.  Take medicines only as directed by your health care provider.  Check with your health care provider before starting new prescription or over-the-counter medicines. SEEK MEDICAL CARE IF:  You are not able to take your medicines as prescribed.  Treatment is not helping your TUD and your symptoms get worse. SEEK IMMEDIATE MEDICAL CARE IF:  You have serious thoughts about hurting yourself or others.  You have trouble breathing, chest pain,  sudden weakness, or sudden numbness in part of your body.   This information is not intended to replace advice given to you by your health care provider. Make sure you discuss any questions you have with your health care provider.   Document Released: 03/14/2004 Document Revised: 07/30/2014 Document Reviewed: 09/04/2013 [employer] Interactive Patient Education 2016 [employer]  

## 2015-12-30 ENCOUNTER — Encounter: Payer: Self-pay | Admitting: Family Medicine

## 2016-01-03 ENCOUNTER — Encounter: Payer: Self-pay | Admitting: Family Medicine

## 2016-01-06 NOTE — Progress Notes (Signed)
Patient ID: Nathan Hess, male   DOB: September 04, 1950, 65 y.o.   MRN: [id number]   Subjective:    Patient ID: Nathan Hess, male    DOB: 12-01-1950, 65 y.o.   MRN: [id number]  Chief Complaint  Patient presents with  . Follow-up    HPI Patient is in today for follow up. Stopped Effexor in March and reports feeling well. No anhedonia or flare in anxiety or depression. Has had some myalgias and arthralgias and is relieved with Advil prn. Has not taken Lisinopril this week. Is due for colonoscopy. Last one performed by Dr Audie Box (sp?). NO GI concerns. Denies CP/palp/SOB/HA/congestion/fevers/GI or GU c/o. Taking meds as prescribed  Past Medical History  Diagnosis Date  . Migraine headache 08/03/2012  . Diabetes mellitus type 2 in obese (HCC) 05/25/2012  . Hyperlipidemia 05/25/2012  . Tobacco use disorder 10/30/2012  . Other malaise and fatigue 11/01/2012  . Left knee DJD   . Unspecified essential hypertension 05/25/2012    Does not see a cardiologist  . Depression   . Enlarged prostate   . Preventative health care 09/22/2014  . History of colonic polyps 06/26/2015    3rd colonoscopy in 2016 with Dr Grayling Congress in [place]    Past Surgical History  Procedure Laterality Date  . Left hand surgery    . Partial knee arthroplasty Left 04/06/2013    Procedure: UNICOMPARTMENTAL LEFT KNEE;  Surgeon: Nilda Simmer, MD;  Location: [facility] OR;  Service: Orthopedics;  Laterality: Left;  . Umbilical hernia repair  2017    w/ small ventral ex mesh    Family History  Problem Relation Age of Onset  . Alzheimer's disease Mother   . Hypertension Father   . Diabetes Brother     ?  Marland Kitchen Alcohol abuse Maternal Grandfather   . Cancer Paternal Grandmother   . Heart disease Paternal Grandfather     MI    Social History   Social History  . Marital Status: Married    Spouse Name: N/A  . Number of Children: N/A  . Years of Education: N/A   Occupational History  . Not on file.   Social History  Main Topics  . Smoking status: Current Every Day Smoker -- 1.00 packs/day for 15 years    Types: Cigarettes  . Smokeless tobacco: Not on file  . Alcohol Use: Yes     Comment: 1 beer about every 2 months   . Drug Use: No  . Sexual Activity: Yes    Birth Control/ Protection: Condom   Other Topics Concern  . Not on file   Social History Narrative    Outpatient Prescriptions Prior to Visit  Medication Sig Dispense Refill  . aspirin 325 MG tablet Take 1 tablet (325 mg total) by mouth daily. 30 tablet 0  . tadalafil (CIALIS) 20 MG tablet Take 1 tablet (20 mg total) by mouth daily as needed for erectile dysfunction. 9 tablet 5  . triamcinolone cream (KENALOG) 0.1 % Apply 1 application topically 2 (two) times daily. 80 g 1  . lisinopril (PRINIVIL,ZESTRIL) 10 MG tablet Take 1 tablet (10 mg total) by mouth daily. 90 tablet 3  . metFORMIN (GLUCOPHAGE) 500 MG tablet Take 1 tablet (500 mg total) by mouth daily with breakfast. 90 tablet 3  . rosuvastatin (CRESTOR) 20 MG tablet Take 1 tablet (20 mg total) by mouth daily. 90 tablet 3   No facility-administered medications prior to visit.    Allergies  Allergen Reactions  . Chantix [  Varenicline] Other (See Comments)    depression    Review of Systems  Constitutional: Positive for malaise/fatigue. Negative for fever.  HENT: Negative for congestion.   Eyes: Negative for blurred vision.  Respiratory: Negative for shortness of breath.   Cardiovascular: Negative for chest pain, palpitations and leg swelling.  Gastrointestinal: Negative for nausea, abdominal pain and blood in stool.  Genitourinary: Negative for dysuria and frequency.  Musculoskeletal: Negative for falls.  Skin: Negative for rash.  Neurological: Negative for dizziness, loss of consciousness and headaches.  Endo/Heme/Allergies: Negative for environmental allergies.  Psychiatric/Behavioral: Negative for depression. The patient is not nervous/anxious.        Objective:      Physical Exam  Constitutional: He is oriented to person, place, and time. He appears well-developed and well-nourished. No distress.  HENT:  Head: Normocephalic and atraumatic.  Nose: Nose normal.  Eyes: Right eye exhibits no discharge. Left eye exhibits no discharge.  Neck: Normal range of motion. Neck supple.  Cardiovascular: Normal rate and regular rhythm.   No murmur heard. Pulmonary/Chest: Effort normal and breath sounds normal.  Abdominal: Soft. Bowel sounds are normal. There is no tenderness.  Musculoskeletal: He exhibits no edema.  Neurological: He is alert and oriented to person, place, and time.  Skin: Skin is warm and dry.  Psychiatric: He has a normal mood and affect.  Nursing note and vitals reviewed.   BP 122/78 mmHg  Pulse 75  Temp(Src) 98.5 F (36.9 C) (Oral)  Ht 5\' 11"  (1.803 m)  Wt 228 lb 4 oz (103.534 kg)  BMI 31.85 kg/m2  SpO2 97% Wt Readings from Last 3 Encounters:  12/27/15 228 lb 4 oz (103.534 kg)  09/22/15 226 lb 4 oz (102.626 kg)  06/24/15 240 lb 2 oz (108.92 kg)     Lab Results  Component Value Date   WBC 7.3 12/20/2015   HGB 14.6 12/20/2015   HCT 42.5 12/20/2015   PLT 222.0 12/20/2015   GLUCOSE 114* 12/20/2015   CHOL 172 12/20/2015   TRIG 150.0* 12/20/2015   HDL 35.40* 12/20/2015   LDLCALC 107* 12/20/2015   ALT 23 12/20/2015   AST 16 12/20/2015   NA 138 12/20/2015   K 4.3 12/20/2015   CL 106 12/20/2015   CREATININE 0.77 12/20/2015   BUN 16 12/20/2015   CO2 27 12/20/2015   TSH 1.22 12/20/2015   PSA 3.75 04/23/2014   INR 1.00 03/30/2013   HGBA1C 6.7* 12/20/2015   MICROALBUR 0.9 12/20/2015    Lab Results  Component Value Date   TSH 1.22 12/20/2015   Lab Results  Component Value Date   WBC 7.3 12/20/2015   HGB 14.6 12/20/2015   HCT 42.5 12/20/2015   MCV 93.3 12/20/2015   PLT 222.0 12/20/2015   Lab Results  Component Value Date   NA 138 12/20/2015   K 4.3 12/20/2015   CO2 27 12/20/2015   GLUCOSE 114* 12/20/2015   BUN  16 12/20/2015   CREATININE 0.77 12/20/2015   BILITOT 0.4 12/20/2015   ALKPHOS 59 12/20/2015   AST 16 12/20/2015   ALT 23 12/20/2015   PROT 6.9 12/20/2015   ALBUMIN 4.4 12/20/2015   CALCIUM 9.1 12/20/2015   GFR 107.67 12/20/2015   Lab Results  Component Value Date   CHOL 172 12/20/2015   Lab Results  Component Value Date   HDL 35.40* 12/20/2015   Lab Results  Component Value Date   LDLCALC 107* 12/20/2015   Lab Results  Component Value Date  TRIG 150.0* 12/20/2015   Lab Results  Component Value Date   CHOLHDL 5 12/20/2015   Lab Results  Component Value Date   HGBA1C 6.7* 12/20/2015       Assessment & Plan:   Problem List Items Addressed This Visit    Tobacco use disorder    Encouraged complete cessation. Discussed need to quit as relates to risk of numerous cancers, cardiac and pulmonary disease as well as neurologic complications. Counseled for greater than 3 minutes Did not tolerate Chantix. Vaporizer was not helpful in past but is going to try again      Relevant Medications   rosuvastatin (CRESTOR) 20 MG tablet   Other Relevant Orders   TSH   CBC   Lipid panel   Hemoglobin A1c   Comprehensive metabolic panel   Hyperlipidemia - Primary    Tolerating statin, encouraged heart healthy diet, avoid trans fats, minimize simple carbs and saturated fats. Increase exercise as tolerated      Relevant Medications   rosuvastatin (CRESTOR) 20 MG tablet   lisinopril (PRINIVIL,ZESTRIL) 5 MG tablet   Other Relevant Orders   TSH   CBC   Lipid panel   Hemoglobin A1c   Comprehensive metabolic panel   Essential hypertension    Well controlled, no changes to meds. Encouraged heart healthy diet such as the DASH diet and exercise as tolerated.       Relevant Medications   rosuvastatin (CRESTOR) 20 MG tablet   lisinopril (PRINIVIL,ZESTRIL) 5 MG tablet   Other Relevant Orders   TSH   CBC   Lipid panel   Hemoglobin A1c   Comprehensive metabolic panel   Diabetes  mellitus type 2 in obese (HCC)    hgba1c acceptable, minimize simple carbs. Increase exercise as tolerated. Continue current meds      Relevant Medications   rosuvastatin (CRESTOR) 20 MG tablet   lisinopril (PRINIVIL,ZESTRIL) 5 MG tablet   Other Relevant Orders   TSH   CBC   Lipid panel   Hemoglobin A1c   Comprehensive metabolic panel   Depression    Has been off Effexor on March 15 and feels better, fatiue is improved.       Relevant Medications   rosuvastatin (CRESTOR) 20 MG tablet   Other Relevant Orders   TSH   CBC   Lipid panel   Hemoglobin A1c   Comprehensive metabolic panel    Other Visit Diagnoses    Low back pain without sciatica, unspecified back pain laterality        Relevant Medications    rosuvastatin (CRESTOR) 20 MG tablet    Other Relevant Orders    TSH    CBC    Lipid panel    Hemoglobin A1c    Comprehensive metabolic panel    Need for 23-polyvalent pneumococcal polysaccharide vaccine        Relevant Orders    Pneumococcal polysaccharide vaccine 23-valent greater than or equal to 2yo subcutaneous/IM (Completed)       I have discontinued Mr. Haigler's metFORMIN, lisinopril, metFORMIN, and lisinopril. I am also having him start on lisinopril. Additionally, I am having him maintain his aspirin, triamcinolone cream, tadalafil, and rosuvastatin.  Meds ordered this encounter  Medications  . rosuvastatin (CRESTOR) 20 MG tablet    Sig: Take 1 tablet (20 mg total) by mouth daily.    Dispense:  90 tablet    Refill:  3    DISCONTINUE ALL PREVIOUS REFILLS FOR THIS MEDICATION  . DISCONTD: metFORMIN (GLUCOPHAGE)  500 MG tablet    Sig: Take 1 tablet (500 mg total) by mouth daily with breakfast.    Dispense:  90 tablet    Refill:  3    DISCONTINUE ALL PREVIOUS REFILLS FOR THIS MEDICATION  . DISCONTD: lisinopril (PRINIVIL,ZESTRIL) 10 MG tablet    Sig: Take 1 tablet (10 mg total) by mouth daily.    Dispense:  90 tablet    Refill:  3    DISCONTINUE ALL  PREVIOUS REFILLS FOR THIS MEDICATION  . lisinopril (PRINIVIL,ZESTRIL) 5 MG tablet    Sig: Take 1 tablet (5 mg total) by mouth daily.    Dispense:  30 tablet    Refill:  5     Danise EdgeBLYTH, STACEY, MD

## 2016-01-06 NOTE — Assessment & Plan Note (Signed)
Well controlled, no changes to meds. Encouraged heart healthy diet such as the DASH diet and exercise as tolerated.  °

## 2016-01-06 NOTE — Assessment & Plan Note (Signed)
Has been off Effexor on March 15 and feels better, fatiue is improved.

## 2016-02-07 NOTE — Telephone Encounter (Signed)
Completed.

## 2016-02-09 ENCOUNTER — Ambulatory Visit (INDEPENDENT_AMBULATORY_CARE_PROVIDER_SITE_OTHER): Payer: BLUE CROSS/BLUE SHIELD | Admitting: Family Medicine

## 2016-02-09 ENCOUNTER — Encounter: Payer: Self-pay | Admitting: Family Medicine

## 2016-02-09 DIAGNOSIS — K115 Sialolithiasis: Secondary | ICD-10-CM | POA: Diagnosis not present

## 2016-02-09 DIAGNOSIS — E1169 Type 2 diabetes mellitus with other specified complication: Secondary | ICD-10-CM

## 2016-02-09 DIAGNOSIS — E119 Type 2 diabetes mellitus without complications: Secondary | ICD-10-CM

## 2016-02-09 DIAGNOSIS — I10 Essential (primary) hypertension: Secondary | ICD-10-CM | POA: Diagnosis not present

## 2016-02-09 DIAGNOSIS — E669 Obesity, unspecified: Secondary | ICD-10-CM

## 2016-02-09 DIAGNOSIS — F17200 Nicotine dependence, unspecified, uncomplicated: Secondary | ICD-10-CM | POA: Diagnosis not present

## 2016-02-09 NOTE — Progress Notes (Signed)
Pre visit review using our clinic review tool, if applicable. No additional management support is needed unless otherwise documented below in the visit note. 

## 2016-02-09 NOTE — Patient Instructions (Signed)
Salivary Stone A salivary stone is a mineral deposit that builds up in the ducts that drain your salivary glands. Most salivary gland stones are made of calcium. When a stone forms, saliva can back up into the gland and cause painful swelling. Your salivary glands are the glands that produce spit (saliva). You have six major salivary glands. Each gland has a duct that carries saliva into your mouth. Saliva keeps your mouth moist and breaks down the food that you eat. It also helps to prevent tooth decay. Two salivary glands are located just in front of your ears (parotid). The ducts for these glands open up inside your cheeks, near your back teeth. You also have two glands under your tongue (sublingual) and two glands under your jaw (submandibular). The ducts for these glands open under your tongue. A stone can form in any salivary gland. The most common place for a salivary stone to develop is in a submandibular salivary gland. CAUSES Any condition that reduces the flow of saliva may lead to stone formation. It is not known why some people form stones and others do not.  RISK FACTORS You may be more likely to develop a salivary stone if you:  Are male.  Do not drink enough water.  Smoke.  Have high blood pressure.  Have gout.  Have diabetes. SIGNS AND SYMPTOMS The main sign of a salivary gland stone is sudden swelling of a salivary gland when eating. This usually happens under the jaw on one side. Other signs and symptoms include:  Swelling of the cheek or under the tongue when eating.  Pain in the swollen area.  Trouble chewing or swallowing.  Swelling that goes down after eating. DIAGNOSIS Your health care provider may diagnose a salivary gland stone based on your signs and symptoms. The health care provider will also do a physical exam. In many cases, a stone can be felt in a duct inside your mouth. You may need to see an ear, nose, and throat specialist (ENT or otolaryngologist)  for diagnosis and treatment. You may also need to have diagnostic tests. These may include imaging studies to check for a stone, such as:  X-rays.  Ultrasound.  CT scan.  MRI. TREATMENT Home care may be enough to treat a small stone that is not causing symptoms. Treatment of a stone that is large enough to cause symptoms may include:  Probing and widening the duct to allow the stone to pass.  Inserting a thin, flexible scope (endoscope) into the duct to locate and remove the stone.  Breaking up the stone with sound waves.  Removing the entire salivary gland. HOME CARE INSTRUCTIONS  Drink enough fluid to keep your urine clear or pale yellow.  Follow these instructions every few hours:  Suck on a lemon candy to stimulate the flow of saliva.  Put a hot compress over the gland.  Gently massage the gland.  Do not use any tobacco products, including cigarettes, chewing tobacco, or electronic cigarettes. If you need help quitting, ask your health care provider. SEEK MEDICAL CARE IF:  You have pain and swelling in your face, jaw, or mouth after eating.  You have persistent swelling in any of these places:  In front of your ear.  Under your jaw.  Inside your mouth. SEEK IMMEDIATE MEDICAL CARE IF:  You have pain and swelling in your face, jaw, or mouth that are getting worse.  Your pain and swelling make it hard to swallow or breathe.     This information is not intended to replace advice given to you by your health care provider. Make sure you discuss any questions you have with your health care provider.   Document Released: 08/16/2004 Document Revised: 07/30/2014 Document Reviewed: 12/09/2013 Elsevier Interactive Patient Education 2016 [employer] Inc[employer]  

## 2016-02-28 ENCOUNTER — Encounter: Payer: Self-pay | Admitting: Family Medicine

## 2016-02-28 DIAGNOSIS — K115 Sialolithiasis: Secondary | ICD-10-CM

## 2016-02-28 HISTORY — DX: Sialolithiasis: K11.5

## 2016-02-28 NOTE — Assessment & Plan Note (Signed)
No smoking for roughly 6 weeks now. Offered ongoing encouragement and tactics for success

## 2016-02-28 NOTE — Assessment & Plan Note (Signed)
hgba1c acceptable, minimize simple carbs. Increase exercise as tolerated.  

## 2016-02-28 NOTE — Assessment & Plan Note (Signed)
Improved today but has had some swelling and discomfort. Encouraged lemon and sour candy prn if returns and if is recurrent then will need imaging and referral

## 2016-02-28 NOTE — Progress Notes (Signed)
Patient ID: Nathan AsperStephen C Curington, male   DOB: [birth year], 65 y.o.   MRN: [id number]   Subjective:    Patient ID: Nathan Hess, male    DOB: [birth year], 65 y.o.   MRN: [id number]  Chief Complaint  Patient presents with  . Follow-up    swollen gland    HPI Patient is in today for evaluation of swelling in salivary gland just along mandible. No recent illness or fevers. Was very swollen for a couple days but is better today. Has not smoked in 6 weeks. No other acute complaints. Denies polyuria or polydipsia. Denies CP/palp/SOB/HA/congestion/fevers/GI or GU c/o. Taking meds as prescribed  Past Medical History:  Diagnosis Date  . Depression   . Diabetes mellitus type 2 in obese (HCC) 05/25/2012  . Enlarged prostate   . History of colonic polyps 06/26/2015   3rd colonoscopy in 2016 with [place] Grayling Congress[place]pari in [place]  . Hyperlipidemia 05/25/2012  . Left knee DJD   . Migraine headache 08/03/2012  . Other malaise and fatigue 11/01/2012  . Preventative health care 09/22/2014  . Salivary gland stone 02/28/2016  . Tobacco use disorder 10/30/2012  . Unspecified essential hypertension 05/25/2012   Does not see a cardiologist    Past Surgical History:  Procedure Laterality Date  . left hand surgery    . PARTIAL KNEE ARTHROPLASTY Left 04/06/2013   Procedure: UNICOMPARTMENTAL LEFT KNEE;  Surgeon: Nilda Simmerobert A Wainer, MD;  [facility];  Service: Orthopedics;  Laterality: Left;  . UMBILICAL HERNIA REPAIR  2017   w/ small ventral ex mesh    Family History  Problem Relation Age of Onset  . Alzheimer's disease Mother   . Hypertension Father   . Diabetes Brother     ?  Marland Kitchen. Alcohol abuse Maternal Grandfather   . Cancer Paternal Grandmother   . Heart disease Paternal Grandfather     MI    Social History   Social History  . Marital status: Married    Spouse name: N/A  . Number of children: N/A  . Years of education: N/A   Occupational History  . Not on file.   Social History Main Topics  .  Smoking status: Current Every Day Smoker    Packs/day: 1.00    Years: 15.00    Types: Cigarettes  . Smokeless tobacco: Not on file  . Alcohol use Yes     Comment: 1 beer about every 2 months   . Drug use: No  . Sexual activity: Yes    Birth control/ protection: Condom   Other Topics Concern  . Not on file   Social History Narrative  . No narrative on file    Outpatient Medications Prior to Visit  Medication Sig Dispense Refill  . aspirin 325 MG tablet Take 1 tablet (325 mg total) by mouth daily. 30 tablet 0  . lisinopril (PRINIVIL,ZESTRIL) 5 MG tablet Take 1 tablet (5 mg total) by mouth daily. 30 tablet 5  . rosuvastatin (CRESTOR) 20 MG tablet Take 1 tablet (20 mg total) by mouth daily. 90 tablet 3  . tadalafil (CIALIS) 20 MG tablet Take 1 tablet (20 mg total) by mouth daily as needed for erectile dysfunction. 9 tablet 5  . triamcinolone cream (KENALOG) 0.1 % Apply 1 application topically 2 (two) times daily. 80 g 1   No facility-administered medications prior to visit.     Allergies  Allergen Reactions  . Chantix [Varenicline] Other (See Comments)    depression    Review of Systems  Constitutional: Negative for fever and malaise/fatigue.  HENT: Negative for congestion.   Eyes: Negative for blurred vision.  Respiratory: Negative for shortness of breath.   Cardiovascular: Negative for chest pain, palpitations and leg swelling.  Gastrointestinal: Negative for abdominal pain, blood in stool and nausea.  Genitourinary: Negative for dysuria and frequency.  Musculoskeletal: Negative for falls.  Skin: Negative for rash.  Neurological: Negative for dizziness, loss of consciousness and headaches.  Endo/Heme/Allergies: Negative for environmental allergies.  Psychiatric/Behavioral: Negative for depression. The patient is not nervous/anxious.        Objective:    Physical Exam  Constitutional: He is oriented to person, place, and time. He appears well-developed and  well-nourished. No distress.  HENT:  Head: Normocephalic and atraumatic.  Nose: Nose normal.  Eyes: Right eye exhibits no discharge. Left eye exhibits no discharge.  Neck: Normal range of motion. Neck supple.  Cardiovascular: Normal rate and regular rhythm.   No murmur heard. Pulmonary/Chest: Effort normal and breath sounds normal.  Abdominal: Soft. Bowel sounds are normal. There is no tenderness.  Musculoskeletal: He exhibits no edema.  Neurological: He is alert and oriented to person, place, and time.  Skin: Skin is warm and dry.  Psychiatric: He has a normal mood and affect.  Nursing note and vitals reviewed.   BP 130/80   Pulse 75   Temp 98.6 F (37 C) (Oral)   Ht 5\' 11"  (1.803 m)   Wt 232 lb (105.2 kg)   SpO2 96%   BMI 32.36 kg/m  Wt Readings from Last 3 Encounters:  02/09/16 232 lb (105.2 kg)  12/27/15 228 lb 4 oz (103.5 kg)  09/22/15 226 lb 4 oz (102.6 kg)     Lab Results  Component Value Date   WBC 7.3 12/20/2015   HGB 14.6 12/20/2015   HCT 42.5 12/20/2015   PLT 222.0 12/20/2015   GLUCOSE 114 (H) 12/20/2015   CHOL 172 12/20/2015   TRIG 150.0 (H) 12/20/2015   HDL 35.40 (L) 12/20/2015   LDLCALC 107 (H) 12/20/2015   ALT 23 12/20/2015   AST 16 12/20/2015   NA 138 12/20/2015   K 4.3 12/20/2015   CL 106 12/20/2015   CREATININE 0.77 12/20/2015   BUN 16 12/20/2015   CO2 27 12/20/2015   TSH 1.22 12/20/2015   PSA 3.75 04/23/2014   INR 1.00 03/30/2013   HGBA1C 6.7 (H) 12/20/2015   MICROALBUR 0.9 12/20/2015    Lab Results  Component Value Date   TSH 1.22 12/20/2015   Lab Results  Component Value Date   WBC 7.3 12/20/2015   HGB 14.6 12/20/2015   HCT 42.5 12/20/2015   MCV 93.3 12/20/2015   PLT 222.0 12/20/2015   Lab Results  Component Value Date   NA 138 12/20/2015   K 4.3 12/20/2015   CO2 27 12/20/2015   GLUCOSE 114 (H) 12/20/2015   BUN 16 12/20/2015   CREATININE 0.77 12/20/2015   BILITOT 0.4 12/20/2015   ALKPHOS 59 12/20/2015   AST 16  12/20/2015   ALT 23 12/20/2015   PROT 6.9 12/20/2015   ALBUMIN 4.4 12/20/2015   CALCIUM 9.1 12/20/2015   GFR 107.67 12/20/2015   Lab Results  Component Value Date   CHOL 172 12/20/2015   Lab Results  Component Value Date   HDL 35.40 (L) 12/20/2015   Lab Results  Component Value Date   LDLCALC 107 (H) 12/20/2015   Lab Results  Component Value Date   TRIG 150.0 (H) 12/20/2015   Lab  Results  Component Value Date   CHOLHDL 5 12/20/2015   Lab Results  Component Value Date   HGBA1C 6.7 (H) 12/20/2015       Assessment & Plan:   Problem List Items Addressed This Visit    Diabetes mellitus type 2 in obese (HCC)    hgba1c acceptable, minimize simple carbs. Increase exercise as tolerated.       Essential hypertension    Well controlled, no changes to meds. Encouraged heart healthy diet such as the DASH diet and exercise as tolerated.       Tobacco use disorder    No smoking for roughly 6 weeks now. Offered ongoing encouragement and tactics for success      Salivary gland stone    Improved today but has had some swelling and discomfort. Encouraged lemon and sour candy prn if returns and if is recurrent then will need imaging and referral       Other Visit Diagnoses   None.     I am having Mr. Zemaitis maintain his aspirin, triamcinolone cream, tadalafil, rosuvastatin, and lisinopril.  No orders of the defined types were placed in this encounter.    [name], MD

## 2016-02-28 NOTE — Assessment & Plan Note (Signed)
Well controlled, no changes to meds. Encouraged heart healthy diet such as the DASH diet and exercise as tolerated.  °

## 2016-03-19 DIAGNOSIS — M5126 Other intervertebral disc displacement, lumbar region: Secondary | ICD-10-CM | POA: Diagnosis not present

## 2016-03-19 DIAGNOSIS — M6240 Contracture of muscle, unspecified site: Secondary | ICD-10-CM | POA: Diagnosis not present

## 2016-03-19 DIAGNOSIS — M5020 Other cervical disc displacement, unspecified cervical region: Secondary | ICD-10-CM | POA: Diagnosis not present

## 2016-03-19 DIAGNOSIS — M7060 Trochanteric bursitis, unspecified hip: Secondary | ICD-10-CM | POA: Diagnosis not present

## 2016-03-22 ENCOUNTER — Other Ambulatory Visit: Payer: Self-pay | Admitting: Family Medicine

## 2016-03-22 DIAGNOSIS — E669 Obesity, unspecified: Secondary | ICD-10-CM

## 2016-03-22 DIAGNOSIS — M545 Low back pain: Secondary | ICD-10-CM

## 2016-03-22 DIAGNOSIS — E1169 Type 2 diabetes mellitus with other specified complication: Secondary | ICD-10-CM

## 2016-03-22 DIAGNOSIS — F17200 Nicotine dependence, unspecified, uncomplicated: Secondary | ICD-10-CM

## 2016-03-22 DIAGNOSIS — I10 Essential (primary) hypertension: Secondary | ICD-10-CM

## 2016-03-22 DIAGNOSIS — F329 Major depressive disorder, single episode, unspecified: Secondary | ICD-10-CM

## 2016-03-22 DIAGNOSIS — F32A Depression, unspecified: Secondary | ICD-10-CM

## 2016-03-22 DIAGNOSIS — E785 Hyperlipidemia, unspecified: Secondary | ICD-10-CM

## 2016-06-07 ENCOUNTER — Encounter: Payer: BLUE CROSS/BLUE SHIELD | Admitting: Family Medicine

## 2016-09-11 ENCOUNTER — Other Ambulatory Visit (INDEPENDENT_AMBULATORY_CARE_PROVIDER_SITE_OTHER): Payer: BLUE CROSS/BLUE SHIELD

## 2016-09-11 DIAGNOSIS — M545 Low back pain, unspecified: Secondary | ICD-10-CM

## 2016-09-11 DIAGNOSIS — E1169 Type 2 diabetes mellitus with other specified complication: Secondary | ICD-10-CM

## 2016-09-11 DIAGNOSIS — I10 Essential (primary) hypertension: Secondary | ICD-10-CM | POA: Diagnosis not present

## 2016-09-11 DIAGNOSIS — F32A Depression, unspecified: Secondary | ICD-10-CM

## 2016-09-11 DIAGNOSIS — E785 Hyperlipidemia, unspecified: Secondary | ICD-10-CM | POA: Diagnosis not present

## 2016-09-11 DIAGNOSIS — E669 Obesity, unspecified: Secondary | ICD-10-CM

## 2016-09-11 DIAGNOSIS — F329 Major depressive disorder, single episode, unspecified: Secondary | ICD-10-CM

## 2016-09-11 DIAGNOSIS — F17200 Nicotine dependence, unspecified, uncomplicated: Secondary | ICD-10-CM

## 2016-09-11 LAB — COMPREHENSIVE METABOLIC PANEL
ALBUMIN: 4.5 g/dL (ref 3.5–5.2)
ALT: 29 U/L (ref 0–53)
AST: 19 U/L (ref 0–37)
Alkaline Phosphatase: 57 U/L (ref 39–117)
BUN: 17 mg/dL (ref 6–23)
CALCIUM: 9.5 mg/dL (ref 8.4–10.5)
CHLORIDE: 106 meq/L (ref 96–112)
CO2: 27 mEq/L (ref 19–32)
Creatinine, Ser: 0.85 mg/dL (ref 0.40–1.50)
GFR: 95.85 mL/min (ref >60.00)
Glucose, Bld: 115 mg/dL — ABNORMAL HIGH (ref 70–99)
POTASSIUM: 4.2 meq/L (ref 3.5–5.1)
SODIUM: 138 meq/L (ref 135–145)
Total Bilirubin: 0.4 mg/dL (ref 0.2–1.2)
Total Protein: 7.3 g/dL (ref 6.0–8.3)

## 2016-09-11 LAB — LIPID PANEL
CHOL/HDL RATIO: 5
CHOLESTEROL: 188 mg/dL (ref 0–200)
HDL: 39.5 mg/dL (ref >39.00)
LDL CALC: 130 mg/dL — AB (ref 0–99)
NonHDL: 148.85
TRIGLYCERIDES: 96 mg/dL (ref 0.0–149.0)
VLDL: 19.2 mg/dL (ref 0.0–40.0)

## 2016-09-11 LAB — TSH: TSH: 2.36 u[IU]/mL (ref 0.35–4.50)

## 2016-09-11 LAB — CBC
HCT: 41.6 % (ref 39.0–52.0)
Hemoglobin: 14.5 g/dL (ref 13.0–17.0)
MCHC: 35 g/dL (ref 30.0–36.0)
MCV: 91.8 fl (ref 78.0–100.0)
PLATELETS: 221 10*3/uL (ref 150.0–400.0)
RBC: 4.53 Mil/uL (ref 4.22–5.81)
RDW: 12.9 % (ref 11.5–15.5)
WBC: 8.3 10*3/uL (ref 4.0–10.5)

## 2016-09-11 LAB — HEMOGLOBIN A1C: Hgb A1c MFr Bld: 6.4 % (ref 4.6–6.5)

## 2016-09-18 ENCOUNTER — Encounter: Payer: BLUE CROSS/BLUE SHIELD | Admitting: Family Medicine

## 2016-11-26 DIAGNOSIS — L209 Atopic dermatitis, unspecified: Secondary | ICD-10-CM

## 2016-11-26 DIAGNOSIS — I889 Nonspecific lymphadenitis, unspecified: Secondary | ICD-10-CM | POA: Diagnosis not present

## 2016-11-26 DIAGNOSIS — Z96659 Presence of unspecified artificial knee joint: Secondary | ICD-10-CM | POA: Diagnosis not present

## 2016-11-26 DIAGNOSIS — R7303 Prediabetes: Secondary | ICD-10-CM

## 2016-11-26 HISTORY — DX: Atopic dermatitis, unspecified: L20.9

## 2016-11-26 HISTORY — DX: Prediabetes: R73.03

## 2016-12-12 DIAGNOSIS — E119 Type 2 diabetes mellitus without complications: Secondary | ICD-10-CM | POA: Diagnosis not present

## 2017-01-08 ENCOUNTER — Ambulatory Visit (INDEPENDENT_AMBULATORY_CARE_PROVIDER_SITE_OTHER): Payer: BLUE CROSS/BLUE SHIELD | Admitting: Family Medicine

## 2017-01-08 ENCOUNTER — Encounter: Payer: Self-pay | Admitting: Family Medicine

## 2017-01-08 VITALS — BP 122/78 | HR 73 | Temp 98.1°F | Resp 18 | Ht 71.0 in | Wt 228.4 lb

## 2017-01-08 DIAGNOSIS — H9192 Unspecified hearing loss, left ear: Secondary | ICD-10-CM | POA: Insufficient documentation

## 2017-01-08 DIAGNOSIS — Z0000 Encounter for general adult medical examination without abnormal findings: Secondary | ICD-10-CM

## 2017-01-08 DIAGNOSIS — E669 Obesity, unspecified: Secondary | ICD-10-CM | POA: Diagnosis not present

## 2017-01-08 DIAGNOSIS — I10 Essential (primary) hypertension: Secondary | ICD-10-CM | POA: Diagnosis not present

## 2017-01-08 DIAGNOSIS — F17200 Nicotine dependence, unspecified, uncomplicated: Secondary | ICD-10-CM

## 2017-01-08 DIAGNOSIS — E785 Hyperlipidemia, unspecified: Secondary | ICD-10-CM | POA: Diagnosis not present

## 2017-01-08 DIAGNOSIS — E1169 Type 2 diabetes mellitus with other specified complication: Secondary | ICD-10-CM | POA: Diagnosis not present

## 2017-01-08 DIAGNOSIS — H9312 Tinnitus, left ear: Secondary | ICD-10-CM | POA: Diagnosis not present

## 2017-01-08 HISTORY — DX: Unspecified hearing loss, left ear: H91.92

## 2017-01-08 NOTE — Assessment & Plan Note (Signed)
Has switched to a vape device and trying to cut down he is encouraged to continue to try and stop altogether.

## 2017-01-08 NOTE — Assessment & Plan Note (Signed)
Tolerating statin, encouraged heart healthy diet, avoid trans fats, minimize simple carbs and saturated fats. Increase exercise as tolerated 

## 2017-01-08 NOTE — Assessment & Plan Note (Signed)
Patient encouraged to maintain heart healthy diet, regular exercise, adequate sleep. Consider daily probiotics. Take medications as prescribed 

## 2017-01-08 NOTE — Assessment & Plan Note (Signed)
Well controlled, no changes to meds. Encouraged heart healthy diet such as the DASH diet and exercise as tolerated.  °
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
## 2017-01-08 NOTE — Patient Instructions (Signed)
Preventive Care 65 Years and Older, Male Preventive care refers to lifestyle choices and visits with your health care provider that can promote health and wellness. What does preventive care include?  A yearly physical exam. This is also called an annual well check.  Dental exams once or twice a year.  Routine eye exams. Ask your health care provider how often you should have your eyes checked.  Personal lifestyle choices, including: ? Daily care of your teeth and gums. ? Regular physical activity. ? Eating a healthy diet. ? Avoiding tobacco and drug use. ? Limiting alcohol use. ? Practicing safe sex. ? Taking low doses of aspirin every day. ? Taking vitamin and mineral supplements as recommended by your health care provider. What happens during an annual well check? The services and screenings done by your health care provider during your annual well check will depend on your age, overall health, lifestyle risk factors, and family history of disease. Counseling Your health care provider may ask you questions about your:  Alcohol use.  Tobacco use.  Drug use.  Emotional well-being.  Home and relationship well-being.  Sexual activity.  Eating habits.  History of falls.  Memory and ability to understand (cognition).  Work and work environment.  Screening You may have the following tests or measurements:  Height, weight, and BMI.  Blood pressure.  Lipid and cholesterol levels. These may be checked every 5 years, or more frequently if you are over 50 years old.  Skin check.  Lung cancer screening. You may have this screening every year starting at age 55 if you have a 30-pack-year history of smoking and currently smoke or have quit within the past 15 years.  Fecal occult blood test (FOBT) of the stool. You may have this test every year starting at age 50.  Flexible sigmoidoscopy or colonoscopy. You may have a sigmoidoscopy every 5 years or a colonoscopy every 10  years starting at age 50.  Prostate cancer screening. Recommendations will vary depending on your family history and other risks.  Hepatitis C blood test.  Hepatitis B blood test.  Sexually transmitted disease (STD) testing.  Diabetes screening. This is done by checking your blood sugar (glucose) after you have not eaten for a while (fasting). You may have this done every 1-3 years.  Abdominal aortic aneurysm (AAA) screening. You may need this if you are a current or former smoker.  Osteoporosis. You may be screened starting at age 70 if you are at high risk.  Talk with your health care provider about your test results, treatment options, and if necessary, the need for more tests. Vaccines Your health care provider may recommend certain vaccines, such as:  Influenza vaccine. This is recommended every year.  Tetanus, diphtheria, and acellular pertussis (Tdap, Td) vaccine. You may need a Td booster every 10 years.  Varicella vaccine. You may need this if you have not been vaccinated.  Zoster vaccine. You may need this after age 60.  Measles, mumps, and rubella (MMR) vaccine. You may need at least one dose of MMR if you were born in 1957 or later. You may also need a second dose.  Pneumococcal 13-valent conjugate (PCV13) vaccine. One dose is recommended after age 65.  Pneumococcal polysaccharide (PPSV23) vaccine. One dose is recommended after age 65.  Meningococcal vaccine. You may need this if you have certain conditions.  Hepatitis A vaccine. You may need this if you have certain conditions or if you travel or work in places where you   may be exposed to hepatitis A.  Hepatitis B vaccine. You may need this if you have certain conditions or if you travel or work in places where you may be exposed to hepatitis B.  Haemophilus influenzae type b (Hib) vaccine. You may need this if you have certain risk factors.  Talk to your health care provider about which screenings and vaccines  you need and how often you need them. This information is not intended to replace advice given to you by your health care provider. Make sure you discuss any questions you have with your health care provider. Document Released: 08/05/2015 Document Revised: 03/28/2016 Document Reviewed: 05/10/2015 Elsevier Interactive Patient Education  2017 [employer].

## 2017-01-08 NOTE — Assessment & Plan Note (Signed)
hgba1c acceptable, minimize simple carbs. Increase exercise as tolerated. Continue current meds 
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
## 2017-01-09 LAB — LIPID PANEL
CHOL/HDL RATIO: 4
Cholesterol: 195 mg/dL (ref 0–200)
HDL: 43.8 mg/dL (ref >39.00)
LDL Cholesterol: 125 mg/dL — ABNORMAL HIGH (ref 0–99)
NONHDL: 151.08
Triglycerides: 131 mg/dL (ref 0.0–149.0)
VLDL: 26.2 mg/dL (ref 0.0–40.0)

## 2017-01-09 LAB — MICROALBUMIN / CREATININE URINE RATIO
Creatinine,U: 136.3 mg/dL
MICROALB UR: 0.6 mg/dL (ref 0.0–1.9)
Microalb Creat Ratio: 0.4 mg/g (ref 0.0–30.0)

## 2017-01-09 LAB — COMPREHENSIVE METABOLIC PANEL
ALT: 24 U/L (ref 0–53)
AST: 15 U/L (ref 0–37)
Albumin: 4.6 g/dL (ref 3.5–5.2)
Alkaline Phosphatase: 59 U/L (ref 39–117)
BILIRUBIN TOTAL: 0.4 mg/dL (ref 0.2–1.2)
BUN: 20 mg/dL (ref 6–23)
CALCIUM: 9.9 mg/dL (ref 8.4–10.5)
CHLORIDE: 104 meq/L (ref 96–112)
CO2: 29 meq/L (ref 19–32)
Creatinine, Ser: 0.94 mg/dL (ref 0.40–1.50)
GFR: 85.25 mL/min (ref >60.00)
GLUCOSE: 105 mg/dL — AB (ref 70–99)
Potassium: 4.3 mEq/L (ref 3.5–5.1)
Sodium: 138 mEq/L (ref 135–145)
Total Protein: 7 g/dL (ref 6.0–8.3)

## 2017-01-09 LAB — HEMOGLOBIN A1C: Hgb A1c MFr Bld: 6.6 % — ABNORMAL HIGH (ref 4.6–6.5)

## 2017-01-09 LAB — PSA: PSA: 3.24 ng/mL (ref 0.10–4.00)

## 2017-01-09 LAB — CBC
HEMATOCRIT: 43.9 % (ref 39.0–52.0)
HEMOGLOBIN: 15.4 g/dL (ref 13.0–17.0)
MCHC: 35 g/dL (ref 30.0–36.0)
MCV: 92.5 fl (ref 78.0–100.0)
PLATELETS: 221 10*3/uL (ref 150.0–400.0)
RBC: 4.75 Mil/uL (ref 4.22–5.81)
RDW: 13.2 % (ref 11.5–15.5)
WBC: 8.2 10*3/uL (ref 4.0–10.5)

## 2017-01-09 LAB — TSH: TSH: 1.57 u[IU]/mL (ref 0.35–4.50)

## 2017-02-18 DIAGNOSIS — H9312 Tinnitus, left ear: Secondary | ICD-10-CM | POA: Diagnosis not present

## 2017-02-18 DIAGNOSIS — H608X3 Other otitis externa, bilateral: Secondary | ICD-10-CM | POA: Diagnosis not present

## 2017-02-18 DIAGNOSIS — H90A22 Sensorineural hearing loss, unilateral, left ear, with restricted hearing on the contralateral side: Secondary | ICD-10-CM | POA: Diagnosis not present

## 2017-03-06 DIAGNOSIS — J209 Acute bronchitis, unspecified: Secondary | ICD-10-CM | POA: Diagnosis not present

## 2017-04-18 DIAGNOSIS — J0100 Acute maxillary sinusitis, unspecified: Secondary | ICD-10-CM | POA: Diagnosis not present

## 2017-07-15 ENCOUNTER — Ambulatory Visit: Payer: BLUE CROSS/BLUE SHIELD | Admitting: Family Medicine

## 2017-09-10 DIAGNOSIS — Z23 Encounter for immunization: Secondary | ICD-10-CM | POA: Diagnosis not present

## 2017-09-27 ENCOUNTER — Encounter: Payer: Self-pay | Admitting: *Deleted

## 2018-01-02 ENCOUNTER — Telehealth: Payer: Self-pay | Admitting: Family Medicine

## 2018-01-02 NOTE — Telephone Encounter (Signed)
Copied from CRM [phone]. Topic: Quick Communication - See Telephone Encounter >> Jan 02, 2018  1:24 PM Ninfa MeekerPoole, Bridgett H wrote: CRM for notification. See Telephone encounter for: 01/02/18.  Left voicemail appt needs to be rescheduled.

## 2018-02-18 ENCOUNTER — Encounter: Payer: BLUE CROSS/BLUE SHIELD | Admitting: Family Medicine

## 2018-03-05 ENCOUNTER — Ambulatory Visit: Payer: Self-pay | Admitting: *Deleted

## 2018-03-05 ENCOUNTER — Ambulatory Visit (HOSPITAL_BASED_OUTPATIENT_CLINIC_OR_DEPARTMENT_OTHER)
Admission: RE | Admit: 2018-03-05 | Discharge: 2018-03-05 | Disposition: A | Discharge status: Home / Self Care | Payer: BLUE CROSS/BLUE SHIELD | Source: Ambulatory Visit | Attending: Family Medicine | Admitting: Family Medicine

## 2018-03-05 ENCOUNTER — Encounter: Payer: Self-pay | Admitting: Family Medicine

## 2018-03-05 ENCOUNTER — Ambulatory Visit: Payer: BLUE CROSS/BLUE SHIELD | Admitting: Family Medicine

## 2018-03-05 VITALS — BP 138/84 | HR 94 | Temp 98.4°F | Resp 16 | Ht 71.0 in | Wt 226.0 lb

## 2018-03-05 DIAGNOSIS — E785 Hyperlipidemia, unspecified: Secondary | ICD-10-CM

## 2018-03-05 DIAGNOSIS — R079 Chest pain, unspecified: Secondary | ICD-10-CM

## 2018-03-05 DIAGNOSIS — J439 Emphysema, unspecified: Secondary | ICD-10-CM | POA: Diagnosis not present

## 2018-03-05 DIAGNOSIS — I10 Essential (primary) hypertension: Secondary | ICD-10-CM | POA: Diagnosis not present

## 2018-03-05 DIAGNOSIS — E1169 Type 2 diabetes mellitus with other specified complication: Secondary | ICD-10-CM

## 2018-03-05 DIAGNOSIS — E669 Obesity, unspecified: Secondary | ICD-10-CM

## 2018-03-05 DIAGNOSIS — R05 Cough: Secondary | ICD-10-CM | POA: Diagnosis not present

## 2018-03-05 LAB — LIPID PANEL
CHOLESTEROL: 184 mg/dL (ref 0–200)
HDL: 39.7 mg/dL (ref >39.00)
LDL Cholesterol: 118 mg/dL — ABNORMAL HIGH (ref 0–99)
NonHDL: 144.66
TRIGLYCERIDES: 133 mg/dL (ref 0.0–149.0)
Total CHOL/HDL Ratio: 5
VLDL: 26.6 mg/dL (ref 0.0–40.0)

## 2018-03-05 LAB — COMPREHENSIVE METABOLIC PANEL
ALK PHOS: 61 U/L (ref 39–117)
ALT: 28 U/L (ref 0–53)
AST: 16 U/L (ref 0–37)
Albumin: 4.5 g/dL (ref 3.5–5.2)
BILIRUBIN TOTAL: 0.4 mg/dL (ref 0.2–1.2)
BUN: 17 mg/dL (ref 6–23)
CALCIUM: 9.8 mg/dL (ref 8.4–10.5)
CO2: 32 mEq/L (ref 19–32)
Chloride: 106 mEq/L (ref 96–112)
Creatinine, Ser: 0.93 mg/dL (ref 0.40–1.50)
GFR: 86.01 mL/min (ref >60.00)
Glucose, Bld: 142 mg/dL — ABNORMAL HIGH (ref 70–99)
POTASSIUM: 4.7 meq/L (ref 3.5–5.1)
Sodium: 143 mEq/L (ref 135–145)
TOTAL PROTEIN: 7.1 g/dL (ref 6.0–8.3)

## 2018-03-05 LAB — CBC
HCT: 43.1 % (ref 39.0–52.0)
Hemoglobin: 15.2 g/dL (ref 13.0–17.0)
MCHC: 35.2 g/dL (ref 30.0–36.0)
MCV: 93.9 fl (ref 78.0–100.0)
Platelets: 217 10*3/uL (ref 150.0–400.0)
RBC: 4.59 Mil/uL (ref 4.22–5.81)
RDW: 13.2 % (ref 11.5–15.5)
WBC: 9.6 10*3/uL (ref 4.0–10.5)

## 2018-03-05 LAB — HEMOGLOBIN A1C: HEMOGLOBIN A1C: 6.5 % (ref 4.6–6.5)

## 2018-03-05 LAB — TROPONIN I: TNIDX: 0.01 ug/L (ref 0.00–0.06)

## 2018-03-05 NOTE — Telephone Encounter (Signed)
Pt called with having "pain in his left chest" states not chest pain. Behind his left pec.  Feels like muscular skeletal pain. It has been going on for about 3 weeks. He has been renovating a farm house, did not have a bed and been sleeping on his kitchen table. It hurts when he coughs and is sitting up but when he leans back it does not hurt. It only last seconds. And the pain is very mild.  He takes Tylenol and Ibuprofen as needed. He denies nausea, vomiting, headache, sweating, no shortness of breath or any other symptoms.  Appointment scheduled for today. Advised to call back if there is increase to cardiac symptoms.  Pt voiced understanding. [facility].  Reason for Disposition . Chest pain(s) lasting a few seconds from coughing AND [2] persists > 3 days  Answer Assessment - Initial Assessment Questions 1. LOCATION: "Where does it hurt?"       Under left pec 2. RADIATION: "Does the pain go anywhere else?" (e.g., into neck, jaw, arms, back)     no 3. ONSET: "When did the chest pain begin?" (Minutes, hours or days)      3 weeks ago 4. PATTERN "Does the pain come and go, or has it been constant since it started?"  "Does it get worse with exertion?"      Worse with exertion 5. DURATION: "How long does it last" (e.g., seconds, minutes, hours)     seconds 6. SEVERITY: "How bad is the pain?"  (e.g., Scale 1-10; mild, moderate, or severe)    - MILD (1-3): doesn't interfere with normal activities     - MODERATE (4-7): interferes with normal activities or awakens from sleep    - SEVERE (8-10): excruciating pain, unable to do any normal activities       mild 7. CARDIAC RISK FACTORS: "Do you have any history of heart problems or risk factors for heart disease?" (e.g., prior heart attack, angina; high blood pressure, diabetes, being overweight, high cholesterol, smoking, or strong family history of heart disease)     Smoking, pre diabetes 8. PULMONARY RISK  FACTORS: "Do you have any history of lung disease?"  (e.g., blood clots in lung, asthma, emphysema, birth control pills)     no 9. CAUSE: "What do you think is causing the chest pain?"     Not sure 10. OTHER SYMPTOMS: "Do you have any other symptoms?" (e.g., dizziness, nausea, vomiting, sweating, fever, difficulty breathing, cough)       no  Protocols used: CHEST PAIN-A-AH

## 2018-03-05 NOTE — Progress Notes (Signed)
[facility] [address], [address] [place], [place] [address] [phone] [phone]  Date:  03/05/2018   Name:  Nathan AsperStephen C Fiallos   DOB:  [birth year]   MRN:  [id number]  PCP:  Bradd CanaryBlyth, Stacey A, MD    Chief Complaint: Chest Pain (x3-4 weeks )   History of Present Illness:  Nathan Hess is a 67 y.o. very pleasant male patient who presents with the following:  Pt of Dr. 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  Moving his chest- like reaching to get a seat belt or stretching his arm across- may cause the pain It feels muscular to him but he got worried and wanted to come in to be seen   The pain will come and go rapidly, within seconds generlaly However it may be more constant if he lays down and is trying to get comfortable in bed- in that situation it will ache longer Not worse with exertion No SOB No fever  He does not have any history of heart problems  No family history either  He did do a ? Nuclear stress in the past but this was years ago   He has been able to do significant work- such as yard work- without any chest pain  Lab Results  Component Value Date   HGBA1C 6.6 (H) 01/08/2017    Patient Active Problem List   Diagnosis Date Noted  . Hearing loss in left ear 01/08/2017  . Salivary gland stone 02/28/2016  . History of colonic polyps 06/26/2015  . Preventative health care 09/22/2014  . Depression 04/25/2014  . Lumbago 04/25/2014  . Need for prophylactic vaccination and inoculation against influenza 05/13/2013  . Left knee DJD   . Other malaise and fatigue 11/01/2012  . Tobacco use disorder 10/30/2012  . Migraine headache  08/03/2012  . Diabetes mellitus type 2 in obese (HCC) 05/25/2012  . Hyperlipidemia 05/25/2012  . Essential hypertension 05/25/2012    Past Medical History:  Diagnosis Date  . Depression   . Diabetes mellitus type 2 in obese (HCC) 05/25/2012  . Enlarged prostate   . Hearing loss in left ear 01/08/2017  . History of colonic polyps 06/26/2015   3rd colonoscopy in 2016 with Dr Grayling [place]  . Hyperlipidemia 05/25/2012  . Left knee DJD   . Migraine headache 08/03/2012  . Other malaise and fatigue 11/01/2012  . Preventative health care 09/22/2014  . Salivary gland stone 02/28/2016  . Tobacco use disorder 10/30/2012  . Unspecified essential hypertension 05/25/2012   Does not see a cardiologist    Past Surgical History:  Procedure Laterality Date  . left hand surgery    . PARTIAL KNEE ARTHROPLASTY Left 04/06/2013   Procedure: UNICOMPARTMENTAL LEFT KNEE;  Surgeon: Nilda Simmerobert A Wainer, MD;  [facility];  Service: Orthopedics;  Laterality: Left;  . UMBILICAL HERNIA REPAIR  2017   w/ small ventral ex mesh    Social History   Tobacco Use  . Smoking status: Former Smoker    Packs/day: 1.00    Years: 15.00    Pack years: 15.00    Types: Cigarettes  .  Smokeless tobacco: Never Used  Substance Use Topics  . Alcohol use: Yes    Comment: 1 beer about every 2 months   . Drug use: No    Family History  Problem Relation Age of Onset  . Alzheimer's disease Mother   . Hypertension Father   . Diabetes Brother        ?  Marland Kitchen. Alcohol abuse Maternal Grandfather   . Cancer Paternal Grandmother   . Heart disease Paternal Grandfather        MI    Allergies  Allergen Reactions  . Chantix [Varenicline] Other (See Comments)    depression    Medication list has been reviewed and updated.  Current Outpatient Medications on File Prior to Visit  Medication Sig Dispense Refill  . tadalafil (CIALIS) 20 MG tablet Take 1 tablet (20 mg total) by mouth daily as needed for erectile dysfunction. 9  tablet 5  . triamcinolone cream (KENALOG) 0.1 % Apply 1 application topically 2 (two) times daily. 80 g 1  . aspirin 325 MG tablet Take 1 tablet (325 mg total) by mouth daily. (Patient not taking: Reported on 03/05/2018) 30 tablet 0  . lisinopril (PRINIVIL,ZESTRIL) 5 MG tablet Take 1 tablet (5 mg total) by mouth daily. (Patient not taking: Reported on 03/05/2018) 30 tablet 5  . rosuvastatin (CRESTOR) 20 MG tablet TAKE 1 TABLET (20 MG TOTAL) BY MOUTH DAILY. (Patient not taking: Reported on 03/05/2018) 90 tablet 2   No current facility-administered medications on file prior to visit.     Review of Systems:  As per HPI- otherwise negative. No fever No chills No belly pain No vomiting or diarrhea   Physical Examination: Vitals:   03/05/18 1222  BP: 138/84  Pulse: 94  Resp: 16  Temp: 98.4 F (36.9 C)  SpO2: 98%   Vitals:   03/05/18 1222  Weight: 226 lb (102.5 kg)  Height: 5\' 11"  (1.803 m)   Body mass index is 31.52 kg/m. Ideal Body Weight: Weight in (lb) to have BMI = 25: 178.9  GEN: WDWN, NAD, Non-toxic, A & O x 3, overweight, looks well  HEENT: Atraumatic, Normocephalic. Neck supple. No masses, No LAD. Bilateral TM wnl, oropharynx normal.  PEERL,EOMI.   Ears and Nose: No external deformity. CV: RRR, No M/G/R. No JVD. No thrill. No extra heart sounds He is very tender over the left lower rib border just left of the sternal border.  He notes this is the site of his pain . PULM: CTA B, no wheezes, crackles, rhonchi. No retractions. No resp. distress. No accessory muscle use. ABD: S, NT, ND, +BS. No rebound. No HSM. EXTR: No c/c/e NEURO Normal gait.  PSYCH: Normally interactive. Conversant. Not depressed or anxious appearing.  Calm demeanor.   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" although I do not appreciate any significant change  Assessment and Plan: Chest pain, unspecified type - Plan: EKG  12-Lead, Troponin I, DG Chest 2 View  Diabetes mellitus type 2 in obese (HCC) - Plan: Comprehensive metabolic panel, Hemoglobin A1c  Hyperlipidemia, unspecified hyperlipidemia type - Plan: Lipid panel  Essential hypertension - Plan: CBC, Comprehensive metabolic panel  Here today with atypical and likely MSK chest pain Pt would like to do a CXR which is certainly fine Will also check a troponin for him today Will do routine labs to catch up  Discussed above EKG finding- offered to set up echo to  look for any evidence of previous silent MI. He declines to do this for now assuming his troponin is negative as expected   Signed Abbe Amsterdam, MD   Results for orders placed or performed in visit on 03/05/18  Troponin I  Result Value Ref Range   TNIDX 0.01 0.00 - 0.06 ug/l  CBC  Result Value Ref Range   WBC 9.6 4.0 - 10.5 K/uL   RBC 4.59 4.22 - 5.81 Mil/uL   Platelets 217.0 150.0 - 400.0 K/uL   Hemoglobin 15.2 13.0 - 17.0 g/dL   HCT 29.5 62.1 - 30.8 %   MCV 93.9 78.0 - 100.0 fl   MCHC 35.2 30.0 - 36.0 g/dL   RDW 65.7 84.6 - 96.2 %  Comprehensive metabolic panel  Result Value Ref Range   Sodium 143 135 - 145 mEq/L   Potassium 4.7 3.5 - 5.1 mEq/L   Chloride 106 96 - 112 mEq/L   CO2 32 19 - 32 mEq/L   Glucose, Bld 142 (H) 70 - 99 mg/dL   BUN 17 6 - 23 mg/dL   Creatinine, Ser 9.52 0.40 - 1.50 mg/dL   Total Bilirubin 0.4 0.2 - 1.2 mg/dL   Alkaline Phosphatase 61 39 - 117 U/L   AST 16 0 - 37 U/L   ALT 28 0 - 53 U/L   Total Protein 7.1 6.0 - 8.3 g/dL   Albumin 4.5 3.5 - 5.2 g/dL   Calcium 9.8 8.4 - 84.1 mg/dL   GFR 32.44 >01.02 mL/min  Hemoglobin A1c  Result Value Ref Range   Hgb A1c MFr Bld 6.5 4.6 - 6.5 %  Lipid panel  Result Value Ref Range   Cholesterol 184 0 - 200 mg/dL   Triglycerides 725.3 0.0 - 149.0 mg/dL   HDL 66.44 >03.47 mg/dL   VLDL 42.5 0.0 - 95.6 mg/dL   LDL Cholesterol 387 (H) 0 - 99 mg/dL   Total CHOL/HDL Ratio 5    NonHDL 144.66    Dg Chest 2  View  Result Date: 03/05/2018 CLINICAL DATA:  Central chest pain for 4 weeks. Pain when coughing. Smoker. EXAM: CHEST - 2 VIEW COMPARISON:  Radiographs 12/24/2014. FINDINGS: The heart size and mediastinal contours are stable. The lungs are hyperinflated but clear. There is no pleural effusion or pneumothorax. There are stable degenerative changes throughout the thoracic spine. IMPRESSION: Stable chest with evidence of emphysema. No acute cardiopulmonary process. Electronically Signed   By: Carey Bullocks M.D.   On: 03/05/2018 13:20   Lab message to pt Troponin test (for your heart) is normal! Blood counts normal Metabolic profile ok A1c shows fine control of your diabetes Cholesterol is ok, discuss with Dr. B at your upcoming physical, you might want to increase your statin dose or add a fish oil to try and raise your HDL

## 2018-03-05 NOTE — Patient Instructions (Addendum)
It was good to see you today- I will be in touch with your chest x-ray and labs asap I think that your pain is musculoskeletal in origin. However, if not getting better or if getting worse please do let me know or otherwise seek care

## 2018-04-03 ENCOUNTER — Encounter: Payer: Self-pay | Admitting: Family Medicine

## 2018-04-03 ENCOUNTER — Ambulatory Visit (INDEPENDENT_AMBULATORY_CARE_PROVIDER_SITE_OTHER): Payer: BLUE CROSS/BLUE SHIELD | Admitting: Family Medicine

## 2018-04-03 VITALS — BP 122/70 | HR 65 | Temp 97.9°F | Resp 18 | Ht 71.0 in | Wt 220.6 lb

## 2018-04-03 DIAGNOSIS — Z0000 Encounter for general adult medical examination without abnormal findings: Secondary | ICD-10-CM | POA: Diagnosis not present

## 2018-04-03 DIAGNOSIS — E1169 Type 2 diabetes mellitus with other specified complication: Secondary | ICD-10-CM

## 2018-04-03 DIAGNOSIS — F17200 Nicotine dependence, unspecified, uncomplicated: Secondary | ICD-10-CM | POA: Diagnosis not present

## 2018-04-03 DIAGNOSIS — M940 Chondrocostal junction syndrome [Tietze]: Secondary | ICD-10-CM

## 2018-04-03 DIAGNOSIS — E785 Hyperlipidemia, unspecified: Secondary | ICD-10-CM | POA: Diagnosis not present

## 2018-04-03 DIAGNOSIS — I10 Essential (primary) hypertension: Secondary | ICD-10-CM

## 2018-04-03 DIAGNOSIS — L821 Other seborrheic keratosis: Secondary | ICD-10-CM | POA: Insufficient documentation

## 2018-04-03 DIAGNOSIS — Z23 Encounter for immunization: Secondary | ICD-10-CM

## 2018-04-03 DIAGNOSIS — E669 Obesity, unspecified: Secondary | ICD-10-CM

## 2018-04-03 HISTORY — DX: Chondrocostal junction syndrome (tietze): M94.0

## 2018-04-03 HISTORY — DX: Other seborrheic keratosis: L82.1

## 2018-04-03 MED ORDER — TRIAMCINOLONE ACETONIDE 0.1 % EX CREA: 1.0000 "application " | TOPICAL_CREAM | Freq: Two times a day (BID) | CUTANEOUS | 1 refills | Status: DC

## 2018-04-03 NOTE — Assessment & Plan Note (Signed)
Encouraged heart healthy diet, increase exercise, avoid trans fats, consider a krill oil cap daily. Tolerating Rosuvastatin 

## 2018-04-03 NOTE — Assessment & Plan Note (Signed)
Dark lesion right anterior chest wall

## 2018-04-03 NOTE — Assessment & Plan Note (Signed)
Well controlled, no changes to meds. Encouraged heart healthy diet such as the DASH diet and exercise as tolerated.  °

## 2018-04-03 NOTE — Assessment & Plan Note (Addendum)
hgba1c acceptable, minimize simple carbs. Increase exercise as tolerated.  

## 2018-04-03 NOTE — Patient Instructions (Addendum)
shingrix is the new shingles shot, 2 shots over 2-6 months at pharmacy.  Preventive Care 61 Years and Older, Male Preventive care refers to lifestyle choices and visits with your health care provider that can promote health and wellness. What does preventive care include?  A yearly physical exam. This is also called an annual well check.  Dental exams once or twice a year.  Routine eye exams. Ask your health care provider how often you should have your eyes checked.  Personal lifestyle choices, including: ? Daily care of your teeth and gums. ? Regular physical activity. ? Eating a healthy diet. ? Avoiding tobacco and drug use. ? Limiting alcohol use. ? Practicing safe sex. ? Taking low doses of aspirin every day. ? Taking vitamin and mineral supplements as recommended by your health care provider. What happens during an annual well check? The services and screenings done by your health care provider during your annual well check will depend on your age, overall health, lifestyle risk factors, and family history of disease. Counseling Your health care provider may ask you questions about your:  Alcohol use.  Tobacco use.  Drug use.  Emotional well-being.  Home and relationship well-being.  Sexual activity.  Eating habits.  History of falls.  Memory and ability to understand (cognition).  Work and work Statistician.  Screening You may have the following tests or measurements:  Height, weight, and BMI.  Blood pressure.  Lipid and cholesterol levels. These may be checked every 5 years, or more frequently if you are over 45 years old.  Skin check.  Lung cancer screening. You may have this screening every year starting at age 42 if you have a 30-pack-year history of smoking and currently smoke or have quit within the past 15 years.  Fecal occult blood test (FOBT) of the stool. You may have this test every year starting at age 63.  Flexible sigmoidoscopy or  colonoscopy. You may have a sigmoidoscopy every 5 years or a colonoscopy every 10 years starting at age 72.  Prostate cancer screening. Recommendations will vary depending on your family history and other risks.  Hepatitis C blood test.  Hepatitis B blood test.  Sexually transmitted disease (STD) testing.  Diabetes screening. This is done by checking your blood sugar (glucose) after you have not eaten for a while (fasting). You may have this done every 1-3 years.  Abdominal aortic aneurysm (AAA) screening. You may need this if you are a current or former smoker.  Osteoporosis. You may be screened starting at age 79 if you are at high risk.  Talk with your health care provider about your test results, treatment options, and if necessary, the need for more tests. Vaccines Your health care provider may recommend certain vaccines, such as:  Influenza vaccine. This is recommended every year.  Tetanus, diphtheria, and acellular pertussis (Tdap, Td) vaccine. You may need a Td booster every 10 years.  Varicella vaccine. You may need this if you have not been vaccinated.  Zoster vaccine. You may need this after age 73.  Measles, mumps, and rubella (MMR) vaccine. You may need at least one dose of MMR if you were born in 1957 or later. You may also need a second dose.  Pneumococcal 13-valent conjugate (PCV13) vaccine. One dose is recommended after age 56.  Pneumococcal polysaccharide (PPSV23) vaccine. One dose is recommended after age 30.  Meningococcal vaccine. You may need this if you have certain conditions.  Hepatitis A vaccine. You may need this if  you have certain conditions or if you travel or work in places where you may be exposed to hepatitis A.  Hepatitis B vaccine. You may need this if you have certain conditions or if you travel or work in places where you may be exposed to hepatitis B.  Haemophilus influenzae type b (Hib) vaccine. You may need this if you have certain risk  factors.  Talk to your health care provider about which screenings and vaccines you need and how often you need them. This information is not intended to replace advice given to you by your health care provider. Make sure you discuss any questions you have with your health care provider. Document Released: 08/05/2015 Document Revised: 03/28/2016 Document Reviewed: 05/10/2015 Elsevier Interactive Patient Education  2018 [place]. Seborrheic Keratosis Seborrheic keratosis is a common, noncancerous (benign) skin growth. This condition causes waxy, rough, tan, brown, or black spots to appear on the skin. These skin growths can be flat or raised. What are the causes? The cause of this condition is not known. What increases the risk? This condition is more likely to develop in:  People who have a family history of seborrheic keratosis.  People who are 35 or older.  People who are pregnant.  People who have had estrogen replacement therapy.  What are the signs or symptoms? This condition often occurs on the face, chest, shoulders, back, or other areas. These growths:  Are usually painless, but may become irritated and itchy.  Can be yellow, brown, black, or other colors.  Are slightly raised or have a flat surface.  Are sometimes rough or wart-like in texture.  Are often waxy on the surface.  Are round or oval-shaped.  Sometimes look like they are "stuck on."  Often occur in groups, but may occur as a single growth.  How is this diagnosed? This condition is diagnosed with a medical history and physical exam. A sample of the growth may be tested (skin biopsy). You may need to see a skin specialist (dermatologist). How is this treated? Treatment is not usually needed for this condition, unless the growths are irritated or are often bleeding. You may also choose to have the growths removed if you do not like their appearance. Most commonly, these growths are treated with a  procedure in which liquid nitrogen is applied to "freeze" off the growth (cryosurgery). They may also be burned off with electricity or cut off. Follow these instructions at home:  Watch your growth for any changes.  Keep all follow-up visits as told by your health care provider. This is important.  Do not scratch or pick at the growth or growths. This can cause them to become irritated or infected. Contact a health care provider if:  You suddenly have many new growths.  Your growth bleeds, itches, or hurts.  Your growth suddenly becomes larger or changes color. This information is not intended to replace advice given to you by your health care provider. Make sure you discuss any questions you have with your health care provider. Document Released: 08/11/2010 Document Revised: 12/15/2015 Document Reviewed: 11/24/2014 Elsevier Interactive Patient Education  2018 [employer]. Costochondritis Costochondritis is swelling and irritation (inflammation) of the tissue (cartilage) that connects your ribs to your breastbone (sternum). This causes pain in the front of your chest. The pain usually starts gradually and involves more than one rib. What are the causes? The exact cause of this condition is not always known. It results from stress on the cartilage where your  ribs attach to your sternum. The cause of this stress could be:  Chest injury (trauma).  Exercise or activity, such as lifting.  Severe coughing.  What increases the risk? You may be at higher risk for this condition if you:  Are male.  Are 56?66 years old.  Recently started a new exercise or work activity.  Have low levels of vitamin D.  Have a condition that makes you cough frequently.  What are the signs or symptoms? The main symptom of this condition is chest pain. The pain:  Usually starts gradually and can be sharp or dull.  Gets worse with deep breathing, coughing, or exercise.  Gets better with  rest.  May be worse when you press on the sternum-rib connection (tenderness).  How is this diagnosed? This condition is diagnosed based on your symptoms, medical history, and a physical exam. Your health care provider will check for tenderness when pressing on your sternum. This is the most important finding. You may also have tests to rule out other causes of chest pain. These may include:  A chest X-ray to check for lung problems.  An electrocardiogram (ECG) to see if you have a heart problem that could be causing the pain.  An imaging scan to rule out a chest or rib fracture.  How is this treated? This condition usually goes away on its own over time. Your health care provider may prescribe an NSAID to reduce pain and inflammation. Your health care provider may also suggest that you:  Rest and avoid activities that make pain worse.  Apply heat or cold to the area to reduce pain and inflammation.  Do exercises to stretch your chest muscles.  If these treatments do not help, your health care provider may inject a numbing medicine at the sternum-rib connection to help relieve the pain. Follow these instructions at home:  Avoid activities that make pain worse. This includes any activities that use chest, abdominal, and side muscles.  If directed, put ice on the painful area: ? Put ice in a plastic bag. ? Place a towel between your skin and the bag. ? Leave the ice on for 20 minutes, 2-3 times a day.  If directed, apply heat to the affected area as often as told by your health care provider. Use the heat source that your health care provider recommends, such as a moist heat pack or a heating pad. ? Place a towel between your skin and the heat source. ? Leave the heat on for 20-30 minutes. ? Remove the heat if your skin turns bright red. This is especially important if you are unable to feel pain, heat, or cold. You may have a greater risk of getting burned.  Take over-the-counter  and prescription medicines only as told by your health care provider.  Return to your normal activities as told by your health care provider. Ask your health care provider what activities are safe for you.  Keep all follow-up visits as told by your health care provider. This is important. Contact a health care provider if:  You have chills or a fever.  Your pain does not go away or it gets worse.  You have a cough that does not go away (is persistent). Get help right away if:  You have shortness of breath. This information is not intended to replace advice given to you by your health care provider. Make sure you discuss any questions you have with your health care provider. Document Released: 04/18/2005 Document  Revised: 01/27/2016 Document Reviewed: 11/02/2015 Elsevier Interactive Patient Education  [employer].

## 2018-04-03 NOTE — Assessment & Plan Note (Signed)
Encouraged moist heat and gentle stretching as tolerated. May try NSAIDs and prescription meds as directed and report if symptoms worsen or seek immediate care. Try ice, lidocaine gel and improving. Encouraged not to do heavy lifiting

## 2018-04-03 NOTE — Progress Notes (Signed)
Subjective:  I acted as a Neurosurgeon for Dr. Abner Greenspan. [place], [place]  Patient ID: Nathan Hess, male    DOB: 27-Aug-1950, 67 y.o.   MRN: [id number]  No chief complaint on file.   HPI  Patient is in today for an annual exam. He has no acute concerns. He is following up on his DM, HTN, hyperlipidemia and other medical concerns. No recent febrile illness or acute hospitalizations. Denies palp/SOB/HA/congestion/fevers/GI or GU c/o. Taking meds as prescribed. Has been struggling with anterior chest wall discomfort that is slowly improving. Can be made worse with certain movements but is generally steady. Is doing well with activities of daily living. No difficulties with activities of daily living. Is trying to stay active and maintain a heart healthy diet.    Patient Care Team: Bradd Canary, MD as PCP - General (Family Medicine)   Past Medical History:  Diagnosis Date  . Depression   . Diabetes mellitus type 2 in obese (HCC) 05/25/2012  . Enlarged prostate   . Hearing loss in left ear 01/08/2017  . History of colonic polyps 06/26/2015   3rd colonoscopy in 2016 with Dr Grayling Congress in [place]  . Hyperlipidemia 05/25/2012  . Left knee DJD   . Migraine headache 08/03/2012  . Other malaise and fatigue 11/01/2012  . Preventative health care 09/22/2014  . Salivary gland stone 02/28/2016  . Tobacco use disorder 10/30/2012  . Unspecified essential hypertension 05/25/2012   Does not see a cardiologist    Past Surgical History:  Procedure Laterality Date  . left hand surgery    . PARTIAL KNEE ARTHROPLASTY Left 04/06/2013   Procedure: UNICOMPARTMENTAL LEFT KNEE;  Surgeon: Nilda Simmer, MD;  Location: [facility] OR;  Service: Orthopedics;  Laterality: Left;  . UMBILICAL HERNIA REPAIR  2017   w/ small ventral ex mesh    Family History  Problem Relation Age of Onset  . Alzheimer's disease Mother   . Hypertension Father   . Diabetes Brother        ?  Marland Kitchen Alcohol abuse Maternal Grandfather   . Cancer  Paternal Grandmother   . Heart disease Paternal Grandfather        MI    Social History   Socioeconomic History  . Marital status: Married    Spouse name: Not on file  . Number of children: Not on file  . Years of education: Not on file  . Highest education level: Not on file  Occupational History  . Not on file  Social Needs  . Financial resource strain: Not on file  . Food insecurity:    Worry: Not on file    Inability: Not on file  . Transportation needs:    Medical: Not on file    Non-medical: Not on file  Tobacco Use  . Smoking status: Former Smoker    Packs/day: 1.00    Years: 15.00    Pack years: 15.00    Types: Cigarettes  . Smokeless tobacco: Never Used  Substance and Sexual Activity  . Alcohol use: Yes    Comment: 1 beer about every 2 months   . Drug use: No  . Sexual activity: Yes    Birth control/protection: Condom  Lifestyle  . Physical activity:    Days per week: Not on file    Minutes per session: Not on file  . Stress: Not on file  Relationships  . Social connections:    Talks on phone: Not on file    Gets together:  Not on file    Attends religious service: Not on file    Active member of club or organization: Not on file    Attends meetings of clubs or organizations: Not on file    Relationship status: Not on file  . Intimate partner violence:    Fear of current or ex partner: Not on file    Emotionally abused: Not on file    Physically abused: Not on file    Forced sexual activity: Not on file  Other Topics Concern  . Not on file  Social History Narrative  . Not on file    Outpatient Medications Prior to Visit  Medication Sig Dispense Refill  . aspirin 325 MG tablet Take 1 tablet (325 mg total) by mouth daily. 30 tablet 0  . lisinopril (PRINIVIL,ZESTRIL) 5 MG tablet Take 1 tablet (5 mg total) by mouth daily. 30 tablet 5  . rosuvastatin (CRESTOR) 20 MG tablet TAKE 1 TABLET (20 MG TOTAL) BY MOUTH DAILY. 90 tablet 2  . tadalafil (CIALIS)  20 MG tablet Take 1 tablet (20 mg total) by mouth daily as needed for erectile dysfunction. 9 tablet 5  . triamcinolone cream (KENALOG) 0.1 % Apply 1 application topically 2 (two) times daily. 80 g 1   No facility-administered medications prior to visit.     Allergies  Allergen Reactions  . Chantix [Varenicline] Other (See Comments)    depression    Review of Systems  Constitutional: Negative for fever and malaise/fatigue.  HENT: Negative for congestion.   Eyes: Negative for blurred vision.  Respiratory: Negative for cough and shortness of breath.   Cardiovascular: Positive for chest pain. Negative for palpitations and leg swelling.  Gastrointestinal: Negative for vomiting.  Musculoskeletal: Negative for back pain.  Skin: Negative for rash.  Neurological: Negative for loss of consciousness and headaches.       Objective:    Physical Exam  Constitutional: He is oriented to person, place, and time. He appears well-developed and well-nourished. No distress.  HENT:  Head: Normocephalic and atraumatic.  Eyes: Conjunctivae are normal.  Neck: Normal range of motion. No thyromegaly present.  Cardiovascular: Normal rate and regular rhythm.  Pulmonary/Chest: Effort normal and breath sounds normal. He has no wheezes.  Abdominal: Soft. Bowel sounds are normal. There is no tenderness.  Musculoskeletal: Normal range of motion. He exhibits no edema or deformity.  Neurological: He is alert and oriented to person, place, and time.  Skin: Skin is warm and dry. He is not diaphoretic.  Psychiatric: He has a normal mood and affect.    BP 122/70 (BP Location: Left Arm, Patient Position: Sitting, Cuff Size: Normal)   Pulse 65   Temp 97.9 F (36.6 C) (Oral)   Resp 18   Ht 5\' 11"  (1.803 m)   Wt 220 lb 9.6 oz (100.1 kg)   SpO2 97%   BMI 30.77 kg/m  Wt Readings from Last 3 Encounters:  04/03/18 220 lb 9.6 oz (100.1 kg)  03/05/18 226 lb (102.5 kg)  01/08/17 228 lb 6.4 oz (103.6 kg)   BP  Readings from Last 3 Encounters:  04/03/18 122/70  03/05/18 138/84  01/08/17 122/78     Immunization History  Administered Date(s) Administered  . Influenza, High Dose Seasonal PF 09/10/2017, 04/03/2018  . Influenza, Seasonal, Injecte, Preservative Fre 07/31/2012  . Influenza,inj,Quad PF,6+ Mos 05/13/2013, 04/23/2014, 03/29/2015  . Pneumococcal Conjugate-13 09/21/2014  . Pneumococcal Polysaccharide-23 12/27/2015  . Pneumococcal-Unspecified 08/17/2011  . Tdap 02/18/2009, 06/24/2015, 02/14/2018  . Zoster  09/21/2014    Health Maintenance  Topic Date Due  . COLONOSCOPY  09/09/2000  . FOOT EXAM  03/28/2016  . OPHTHALMOLOGY EXAM  12/26/2016  . HEMOGLOBIN A1C  09/05/2018  . TETANUS/TDAP  02/15/2028  . INFLUENZA VACCINE  Completed  . Hepatitis C Screening  Completed  . PNA vac Low Risk Adult  Completed    Lab Results  Component Value Date   WBC 9.6 03/05/2018   HGB 15.2 03/05/2018   HCT 43.1 03/05/2018   PLT 217.0 03/05/2018   GLUCOSE 142 (H) 03/05/2018   CHOL 184 03/05/2018   TRIG 133.0 03/05/2018   HDL 39.70 03/05/2018   LDLCALC 118 (H) 03/05/2018   ALT 28 03/05/2018   AST 16 03/05/2018   NA 143 03/05/2018   K 4.7 03/05/2018   CL 106 03/05/2018   CREATININE 0.93 03/05/2018   BUN 17 03/05/2018   CO2 32 03/05/2018   TSH 1.57 01/08/2017   PSA 3.24 01/08/2017   INR 1.00 03/30/2013   HGBA1C 6.5 03/05/2018   MICROALBUR 0.6 01/08/2017    Lab Results  Component Value Date   TSH 1.57 01/08/2017   Lab Results  Component Value Date   WBC 9.6 03/05/2018   HGB 15.2 03/05/2018   HCT 43.1 03/05/2018   MCV 93.9 03/05/2018   PLT 217.0 03/05/2018   Lab Results  Component Value Date   NA 143 03/05/2018   K 4.7 03/05/2018   CO2 32 03/05/2018   GLUCOSE 142 (H) 03/05/2018   BUN 17 03/05/2018   CREATININE 0.93 03/05/2018   BILITOT 0.4 03/05/2018   ALKPHOS 61 03/05/2018   AST 16 03/05/2018   ALT 28 03/05/2018   PROT 7.1 03/05/2018   ALBUMIN 4.5 03/05/2018   CALCIUM  9.8 03/05/2018   GFR 86.01 03/05/2018   Lab Results  Component Value Date   CHOL 184 03/05/2018   Lab Results  Component Value Date   HDL 39.70 03/05/2018   Lab Results  Component Value Date   LDLCALC 118 (H) 03/05/2018   Lab Results  Component Value Date   TRIG 133.0 03/05/2018   Lab Results  Component Value Date   CHOLHDL 5 03/05/2018   Lab Results  Component Value Date   HGBA1C 6.5 03/05/2018         Assessment & Plan:   Problem List Items Addressed This Visit    Diabetes mellitus type 2 in obese (HCC)    hgba1c acceptable, minimize simple carbs. Increase exercise as tolerated      Relevant Orders   TSH   Hemoglobin A1c   Hyperlipidemia    Encouraged heart healthy diet, increase exercise, avoid trans fats, consider a krill oil cap daily. Tolerating Rosuvastatin      Relevant Orders   Lipid panel   Essential hypertension    Well controlled, no changes to meds. Encouraged heart healthy diet such as the DASH diet and exercise as tolerated.       Relevant Orders   CBC   Comprehensive metabolic panel   TSH   Tobacco use disorder    Has quit again, restarted after his Dad died a couple of years ago but is not having any trouble      Preventative health care    Patient encouraged to maintain heart healthy diet, regular exercise, adequate sleep. Consider daily probiotics. Take medications as prescribed. Given and reviewed copy of ACP documents from [employer] and encouraged to complete and return.  Costochondritis    Encouraged moist heat and gentle stretching as tolerated. May try NSAIDs and prescription meds as directed and report if symptoms worsen or seek immediate care. Try ice, lidocaine gel and improving. Encouraged not to do heavy lifiting       Seborrheic keratoses    Dark lesion right anterior chest wall       Other Visit Diagnoses    Needs flu shot    -  Primary   Relevant Orders   Flu vaccine HIGH DOSE PF (Fluzone High  dose) (Completed)      I am having Kiyoshi C. Habig maintain his aspirin, tadalafil, lisinopril, rosuvastatin, and triamcinolone cream.  Meds ordered this encounter  Medications  . triamcinolone cream (KENALOG) 0.1 %    Sig: Apply 1 application topically 2 (two) times daily.    Dispense:  80 g    Refill:  1    CMA served as scribe during this visit. History, Physical and Plan performed by medical provider. Documentation and orders reviewed and attested to.  [name], MD

## 2018-04-03 NOTE — Assessment & Plan Note (Signed)
Has quit again, restarted after his Dad died a couple of years ago but is not having any trouble

## 2018-04-03 NOTE — Assessment & Plan Note (Addendum)
Patient encouraged to maintain heart healthy diet, regular exercise, adequate sleep. Consider daily probiotics. Take medications as prescribed. Given and reviewed copy of ACP documents from [place] [employer] and encouraged to complete and return 

## 2018-07-14 DIAGNOSIS — F17210 Nicotine dependence, cigarettes, uncomplicated: Secondary | ICD-10-CM | POA: Diagnosis not present

## 2018-07-14 DIAGNOSIS — E669 Obesity, unspecified: Secondary | ICD-10-CM | POA: Diagnosis not present

## 2018-07-14 DIAGNOSIS — K8020 Calculus of gallbladder without cholecystitis without obstruction: Secondary | ICD-10-CM | POA: Diagnosis not present

## 2018-07-14 DIAGNOSIS — F17200 Nicotine dependence, unspecified, uncomplicated: Secondary | ICD-10-CM | POA: Diagnosis not present

## 2018-07-14 DIAGNOSIS — I10 Essential (primary) hypertension: Secondary | ICD-10-CM | POA: Diagnosis not present

## 2018-07-14 DIAGNOSIS — Z6831 Body mass index (BMI) 31.0-31.9, adult: Secondary | ICD-10-CM | POA: Diagnosis not present

## 2018-07-14 DIAGNOSIS — K8080 Other cholelithiasis without obstruction: Secondary | ICD-10-CM | POA: Diagnosis not present

## 2018-07-14 DIAGNOSIS — Z79899 Other long term (current) drug therapy: Secondary | ICD-10-CM | POA: Diagnosis not present

## 2018-07-14 DIAGNOSIS — R030 Elevated blood-pressure reading, without diagnosis of hypertension: Secondary | ICD-10-CM | POA: Diagnosis not present

## 2018-07-14 DIAGNOSIS — I714 Abdominal aortic aneurysm, without rupture: Secondary | ICD-10-CM | POA: Diagnosis not present

## 2018-10-02 ENCOUNTER — Other Ambulatory Visit (INDEPENDENT_AMBULATORY_CARE_PROVIDER_SITE_OTHER): Payer: BLUE CROSS/BLUE SHIELD

## 2018-10-02 ENCOUNTER — Other Ambulatory Visit: Payer: Self-pay

## 2018-10-02 DIAGNOSIS — E669 Obesity, unspecified: Secondary | ICD-10-CM

## 2018-10-02 DIAGNOSIS — E1169 Type 2 diabetes mellitus with other specified complication: Secondary | ICD-10-CM

## 2018-10-02 DIAGNOSIS — I10 Essential (primary) hypertension: Secondary | ICD-10-CM | POA: Diagnosis not present

## 2018-10-02 DIAGNOSIS — E785 Hyperlipidemia, unspecified: Secondary | ICD-10-CM | POA: Diagnosis not present

## 2018-10-02 LAB — COMPREHENSIVE METABOLIC PANEL
ALBUMIN: 4.5 g/dL (ref 3.5–5.2)
ALT: 32 U/L (ref 0–53)
AST: 19 U/L (ref 0–37)
Alkaline Phosphatase: 67 U/L (ref 39–117)
BUN: 17 mg/dL (ref 6–23)
CALCIUM: 9.3 mg/dL (ref 8.4–10.5)
CO2: 27 mEq/L (ref 19–32)
Chloride: 105 mEq/L (ref 96–112)
Creatinine, Ser: 0.82 mg/dL (ref 0.40–1.50)
GFR: 93.41 mL/min (ref >60.00)
Glucose, Bld: 133 mg/dL — ABNORMAL HIGH (ref 70–99)
POTASSIUM: 4.4 meq/L (ref 3.5–5.1)
Sodium: 140 mEq/L (ref 135–145)
TOTAL PROTEIN: 7.1 g/dL (ref 6.0–8.3)
Total Bilirubin: 0.4 mg/dL (ref 0.2–1.2)

## 2018-10-02 LAB — CBC
HEMATOCRIT: 44.8 % (ref 39.0–52.0)
HEMOGLOBIN: 15.4 g/dL (ref 13.0–17.0)
MCHC: 34.5 g/dL (ref 30.0–36.0)
MCV: 94 fl (ref 78.0–100.0)
Platelets: 214 10*3/uL (ref 150.0–400.0)
RBC: 4.76 Mil/uL (ref 4.22–5.81)
RDW: 13.3 % (ref 11.5–15.5)
WBC: 8.4 10*3/uL (ref 4.0–10.5)

## 2018-10-02 LAB — LIPID PANEL
Cholesterol: 192 mg/dL (ref 0–200)
HDL: 44.1 mg/dL (ref >39.00)
LDL Cholesterol: 132 mg/dL — ABNORMAL HIGH (ref 0–99)
NonHDL: 147.59
Total CHOL/HDL Ratio: 4
Triglycerides: 78 mg/dL (ref 0.0–149.0)
VLDL: 15.6 mg/dL (ref 0.0–40.0)

## 2018-10-02 LAB — TSH: TSH: 1.92 u[IU]/mL (ref 0.35–4.50)

## 2018-10-02 LAB — HEMOGLOBIN A1C: Hgb A1c MFr Bld: 6.7 % — ABNORMAL HIGH (ref 4.6–6.5)

## 2018-10-06 ENCOUNTER — Other Ambulatory Visit: Payer: Self-pay

## 2018-10-06 ENCOUNTER — Ambulatory Visit: Payer: BLUE CROSS/BLUE SHIELD | Admitting: Family Medicine

## 2018-10-06 ENCOUNTER — Encounter: Payer: Self-pay | Admitting: Family Medicine

## 2018-10-06 VITALS — BP 140/82 | HR 73 | Temp 97.9°F | Resp 18 | Wt 229.4 lb

## 2018-10-06 DIAGNOSIS — E1169 Type 2 diabetes mellitus with other specified complication: Secondary | ICD-10-CM

## 2018-10-06 DIAGNOSIS — F329 Major depressive disorder, single episode, unspecified: Secondary | ICD-10-CM

## 2018-10-06 DIAGNOSIS — R351 Nocturia: Secondary | ICD-10-CM | POA: Diagnosis not present

## 2018-10-06 DIAGNOSIS — K8020 Calculus of gallbladder without cholecystitis without obstruction: Secondary | ICD-10-CM

## 2018-10-06 DIAGNOSIS — E669 Obesity, unspecified: Secondary | ICD-10-CM

## 2018-10-06 DIAGNOSIS — I714 Abdominal aortic aneurysm, without rupture, unspecified: Secondary | ICD-10-CM

## 2018-10-06 DIAGNOSIS — E785 Hyperlipidemia, unspecified: Secondary | ICD-10-CM

## 2018-10-06 DIAGNOSIS — I10 Essential (primary) hypertension: Secondary | ICD-10-CM

## 2018-10-06 DIAGNOSIS — M545 Low back pain, unspecified: Secondary | ICD-10-CM

## 2018-10-06 DIAGNOSIS — F32A Depression, unspecified: Secondary | ICD-10-CM

## 2018-10-06 DIAGNOSIS — F17200 Nicotine dependence, unspecified, uncomplicated: Secondary | ICD-10-CM | POA: Diagnosis not present

## 2018-10-06 DIAGNOSIS — Z122 Encounter for screening for malignant neoplasm of respiratory organs: Secondary | ICD-10-CM

## 2018-10-06 HISTORY — DX: Calculus of gallbladder without cholecystitis without obstruction: K80.20

## 2018-10-06 HISTORY — DX: Abdominal aortic aneurysm, without rupture, unspecified: I71.40

## 2018-10-06 HISTORY — DX: Abdominal aortic aneurysm, without rupture: I71.4

## 2018-10-06 MED ORDER — ROSUVASTATIN CALCIUM 20 MG PO TABS: 20.0000 mg | ORAL_TABLET | Freq: Every day | ORAL | 3 refills | Status: DC

## 2018-10-06 MED ORDER — ASPIRIN 325 MG PO TABS: 325.0000 mg | ORAL_TABLET | Freq: Every day | ORAL | 0 refills | Status: DC

## 2018-10-06 MED ORDER — LISINOPRIL 5 MG PO TABS: 5.0000 mg | ORAL_TABLET | Freq: Every day | ORAL | 3 refills | Status: DC

## 2018-10-06 MED ORDER — TRIAMCINOLONE ACETONIDE 0.1 % EX CREA: 1.0000 "application " | TOPICAL_CREAM | Freq: Two times a day (BID) | CUTANEOUS | 1 refills | Status: DC

## 2018-10-06 MED ORDER — TADALAFIL 20 MG PO TABS: 20.0000 mg | ORAL_TABLET | Freq: Every day | ORAL | 5 refills | Status: DC | PRN

## 2018-10-06 NOTE — Assessment & Plan Note (Signed)
Well controlled, no changes to meds. Encouraged heart healthy diet such as the DASH diet and exercise as tolerated.  °

## 2018-10-06 NOTE — Assessment & Plan Note (Signed)
Encouraged complete cessation. Discussed need to quit as relates to risk of numerous cancers, cardiac and pulmonary disease as well as neurologic complications. Counseled for greater than 3 minutes 

## 2018-10-06 NOTE — Assessment & Plan Note (Signed)
Tolerating statin, encouraged heart healthy diet, avoid trans fats, minimize simple carbs and saturated fats. Increase exercise as tolerated 

## 2018-10-06 NOTE — Assessment & Plan Note (Signed)
hgba1c acceptable, minimize simple carbs. Increase exercise as tolerated. Continue current meds 

## 2018-10-06 NOTE — Assessment & Plan Note (Signed)
He reports found on CT scan and was told 3.2 cm. Will request copy of imaging and referred to cardiology for further consideration

## 2018-10-06 NOTE — Progress Notes (Signed)
Subjective:    Patient ID: Nathan Hess, male    DOB: 10-Oct-1950, 68 y.o.   MRN: [id number]  No chief complaint on file.   HPI Patient is in today for follow up. He feels well now. He was seen in ER in [place] for epigastric pain that migrated to RUQ. His CT scan showed gall bladder disease and a AAA. He has had no recurrent symptoms. He feels well today. No recent febrile illness or hospitalizations. Denies CP/palp/SOB/HA/congestion/fevers/GI or GU c/o. Taking meds as prescribed  Past Medical History:  Diagnosis Date  . Depression   . Diabetes mellitus type 2 in obese (HCC) 05/25/2012  . Enlarged prostate   . Hearing loss in left ear 01/08/2017  . History of colonic polyps 06/26/2015   3rd colonoscopy in 2016 with Dr Grayling Congress in [place]  . Hyperlipidemia 05/25/2012  . Left knee DJD   . Migraine headache 08/03/2012  . Other malaise and fatigue 11/01/2012  . Preventative health care 09/22/2014  . Salivary gland stone 02/28/2016  . Tobacco use disorder 10/30/2012  . Unspecified essential hypertension 05/25/2012   Does not see a cardiologist    Past Surgical History:  Procedure Laterality Date  . left hand surgery    . PARTIAL KNEE ARTHROPLASTY Left 04/06/2013   Procedure: UNICOMPARTMENTAL LEFT KNEE;  Surgeon: Nilda Simmer, MD;  Location: [facility] OR;  Service: Orthopedics;  Laterality: Left;  . UMBILICAL HERNIA REPAIR  2017   w/ small ventral ex mesh    Family History  Problem Relation Age of Onset  . Alzheimer's disease Mother   . Hypertension Father   . Diabetes Brother        ?  Marland Kitchen Alcohol abuse Maternal Grandfather   . Cancer Paternal Grandmother   . Heart disease Paternal Grandfather        MI    Social History   Socioeconomic History  . Marital status: Married    Spouse name: Not on file  . Number of children: Not on file  . Years of education: Not on file  . Highest education level: Not on file  Occupational History  . Not on file  Social Needs  .  Financial resource strain: Not on file  . Food insecurity:    Worry: Not on file    Inability: Not on file  . Transportation needs:    Medical: Not on file    Non-medical: Not on file  Tobacco Use  . Smoking status: Former Smoker    Packs/day: 1.00    Years: 15.00    Pack years: 15.00    Types: Cigarettes  . Smokeless tobacco: Never Used  Substance and Sexual Activity  . Alcohol use: Yes    Comment: 1 beer about every 2 months   . Drug use: No  . Sexual activity: Yes    Birth control/protection: Condom  Lifestyle  . Physical activity:    Days per week: Not on file    Minutes per session: Not on file  . Stress: Not on file  Relationships  . Social connections:    Talks on phone: Not on file    Gets together: Not on file    Attends religious service: Not on file    Active member of club or organization: Not on file    Attends meetings of clubs or organizations: Not on file    Relationship status: Not on file  . Intimate partner violence:    Fear of current or ex partner:  Not on file    Emotionally abused: Not on file    Physically abused: Not on file    Forced sexual activity: Not on file  Other Topics Concern  . Not on file  Social History Narrative  . Not on file    Outpatient Medications Prior to Visit  Medication Sig Dispense Refill  . aspirin 325 MG tablet Take 1 tablet (325 mg total) by mouth daily. 30 tablet 0  . lisinopril (PRINIVIL,ZESTRIL) 5 MG tablet Take 1 tablet (5 mg total) by mouth daily. 30 tablet 5  . rosuvastatin (CRESTOR) 20 MG tablet TAKE 1 TABLET (20 MG TOTAL) BY MOUTH DAILY. 90 tablet 2  . tadalafil (CIALIS) 20 MG tablet Take 1 tablet (20 mg total) by mouth daily as needed for erectile dysfunction. 9 tablet 5  . triamcinolone cream (KENALOG) 0.1 % Apply 1 application topically 2 (two) times daily. 80 g 1   No facility-administered medications prior to visit.     Allergies  Allergen Reactions  . Chantix [Varenicline] Other (See Comments)     depression    Review of Systems  Constitutional: Negative for fever and malaise/fatigue.  HENT: Negative for congestion.   Eyes: Negative for blurred vision.  Respiratory: Negative for shortness of breath.   Cardiovascular: Negative for chest pain, palpitations and leg swelling.  Gastrointestinal: Negative for abdominal pain, blood in stool and nausea.  Genitourinary: Negative for dysuria and frequency.  Musculoskeletal: Negative for falls.  Skin: Negative for rash.  Neurological: Negative for dizziness, loss of consciousness and headaches.  Endo/Heme/Allergies: Negative for environmental allergies.  Psychiatric/Behavioral: Negative for depression. The patient is not nervous/anxious.        Objective:    Physical Exam Vitals signs and nursing note reviewed.  Constitutional:      General: He is not in acute distress.    Appearance: He is well-developed.  HENT:     Head: Normocephalic and atraumatic.     Nose: Nose normal.  Eyes:     General:        Right eye: No discharge.        Left eye: No discharge.  Neck:     Musculoskeletal: Normal range of motion and neck supple.  Cardiovascular:     Rate and Rhythm: Normal rate and regular rhythm.     Heart sounds: No murmur.  Pulmonary:     Effort: Pulmonary effort is normal.     Breath sounds: Normal breath sounds.  Abdominal:     General: Bowel sounds are normal.     Palpations: Abdomen is soft.     Tenderness: There is no abdominal tenderness.  Skin:    General: Skin is warm and dry.  Neurological:     Mental Status: He is alert and oriented to person, place, and time.     BP 140/82 (BP Location: Left Arm, Patient Position: Sitting, Cuff Size: Normal)   Pulse 73   Temp 97.9 F (36.6 C) (Oral)   Resp 18   Wt 229 lb 6.4 oz (104.1 kg)   SpO2 98%   BMI 31.99 kg/m  Wt Readings from Last 3 Encounters:  10/06/18 229 lb 6.4 oz (104.1 kg)  04/03/18 220 lb 9.6 oz (100.1 kg)  03/05/18 226 lb (102.5 kg)     Lab  Results  Component Value Date   WBC 8.4 10/02/2018   HGB 15.4 10/02/2018   HCT 44.8 10/02/2018   PLT 214.0 10/02/2018   GLUCOSE 133 (H) 10/02/2018  CHOL 192 10/02/2018   TRIG 78.0 10/02/2018   HDL 44.10 10/02/2018   LDLCALC 132 (H) 10/02/2018   ALT 32 10/02/2018   AST 19 10/02/2018   NA 140 10/02/2018   K 4.4 10/02/2018   CL 105 10/02/2018   CREATININE 0.82 10/02/2018   BUN 17 10/02/2018   CO2 27 10/02/2018   TSH 1.92 10/02/2018   PSA 3.24 01/08/2017   INR 1.00 03/30/2013   HGBA1C 6.7 (H) 10/02/2018   MICROALBUR 0.6 01/08/2017    Lab Results  Component Value Date   TSH 1.92 10/02/2018   Lab Results  Component Value Date   WBC 8.4 10/02/2018   HGB 15.4 10/02/2018   HCT 44.8 10/02/2018   MCV 94.0 10/02/2018   PLT 214.0 10/02/2018   Lab Results  Component Value Date   NA 140 10/02/2018   K 4.4 10/02/2018   CO2 27 10/02/2018   GLUCOSE 133 (H) 10/02/2018   BUN 17 10/02/2018   CREATININE 0.82 10/02/2018   BILITOT 0.4 10/02/2018   ALKPHOS 67 10/02/2018   AST 19 10/02/2018   ALT 32 10/02/2018   PROT 7.1 10/02/2018   ALBUMIN 4.5 10/02/2018   CALCIUM 9.3 10/02/2018   GFR 93.41 10/02/2018   Lab Results  Component Value Date   CHOL 192 10/02/2018   Lab Results  Component Value Date   HDL 44.10 10/02/2018   Lab Results  Component Value Date   LDLCALC 132 (H) 10/02/2018   Lab Results  Component Value Date   TRIG 78.0 10/02/2018   Lab Results  Component Value Date   CHOLHDL 4 10/02/2018   Lab Results  Component Value Date   HGBA1C 6.7 (H) 10/02/2018       Assessment & Plan:   Problem List Items Addressed This Visit    Diabetes mellitus type 2 in obese (HCC)    hgba1c acceptable, minimize simple carbs. Increase exercise as tolerated. Continue current meds      Relevant Medications   lisinopril (PRINIVIL,ZESTRIL) 5 MG tablet   rosuvastatin (CRESTOR) 20 MG tablet   aspirin 325 MG tablet   Other Relevant Orders   Ambulatory referral to  Cardiology   Hemoglobin A1c   Hyperlipidemia    Tolerating statin, encouraged heart healthy diet, avoid trans fats, minimize simple carbs and saturated fats. Increase exercise as tolerated      Relevant Medications   lisinopril (PRINIVIL,ZESTRIL) 5 MG tablet   rosuvastatin (CRESTOR) 20 MG tablet   tadalafil (ADCIRCA/CIALIS) 20 MG tablet   aspirin 325 MG tablet   Other Relevant Orders   Ambulatory referral to Cardiology   Lipid panel   Essential hypertension    Well controlled, no changes to meds. Encouraged heart healthy diet such as the DASH diet and exercise as tolerated.       Relevant Medications   lisinopril (PRINIVIL,ZESTRIL) 5 MG tablet   rosuvastatin (CRESTOR) 20 MG tablet   tadalafil (ADCIRCA/CIALIS) 20 MG tablet   aspirin 325 MG tablet   Other Relevant Orders   Ambulatory referral to Cardiology   CBC   Comprehensive metabolic panel   TSH   Tobacco use disorder    Encouraged complete cessation. Discussed need to quit as relates to risk of numerous cancers, cardiac and pulmonary disease as well as neurologic complications. Counseled for greater than 3 minutes.      Relevant Medications   rosuvastatin (CRESTOR) 20 MG tablet   Other Relevant Orders   Ambulatory referral to Cardiology   Depression  Relevant Medications   rosuvastatin (CRESTOR) 20 MG tablet   Gallstone    Had an episode of epigastric pain spreading to the RUQ so he presented to ER in [place] on December 23 and CT scan confirmed stones and AAA. No further episodes. Encouraged to minimizes fats and let us know if occurs again.      AAA (abdominal aortic aneurysm) (HCC)    He reports found on CT scan and was told 3.2 cm. Will request copy of imaging and referred to cardiology for further consideration      Relevant Medications   lisinopril (PRINIVIL,ZESTRIL) 5 MG tablet   rosuvastatin (CRESTOR) 20 MG tablet   tadalafil (ADCIRCA/CIALIS) 20 MG tablet   aspirin 325 MG tablet   Other Relevant  Orders   Ambulatory referral to Cardiology    Other Visit Diagnoses    Nocturia    -  Primary   Relevant Orders   PSA   Low back pain without sciatica       Relevant Medications   rosuvastatin (CRESTOR) 20 MG tablet   aspirin 325 MG tablet   Encounter for screening for lung cancer       Relevant Orders   CT CHEST LUNG CA SCREEN LOW DOSE W/O CM      I have changed Jeannett Senior C. Krupka's tadalafil. I am also having him maintain his lisinopril, rosuvastatin, aspirin, and triamcinolone cream.  Meds ordered this encounter  Medications  . lisinopril (PRINIVIL,ZESTRIL) 5 MG tablet    Sig: Take 1 tablet (5 mg total) by mouth daily.    Dispense:  90 tablet    Refill:  3  . rosuvastatin (CRESTOR) 20 MG tablet    Sig: Take 1 tablet (20 mg total) by mouth daily.    Dispense:  90 tablet    Refill:  3  . tadalafil (ADCIRCA/CIALIS) 20 MG tablet    Sig: Take 1 tablet (20 mg total) by mouth daily as needed for erectile dysfunction.    Dispense:  9 tablet    Refill:  5  . aspirin 325 MG tablet    Sig: Take 1 tablet (325 mg total) by mouth daily.    Dispense:  30 tablet    Refill:  0  . triamcinolone cream (KENALOG) 0.1 %    Sig: Apply 1 application topically 2 (two) times daily.    Dispense:  80 g    Refill:  1     [name], MD

## 2018-10-06 NOTE — Patient Instructions (Addendum)
Abdominal Aortic Aneurysm  An aneurysm is a bulge in an artery. It happens when blood pushes against a weakened or damaged artery wall. An abdominal aortic aneurysm (AAA) is an aneurysm that occurs in the lower part of the aorta, which is the main artery of the body. The aorta supplies blood from the heart to the rest of the body. Some aneurysms may not cause symptoms. However, an AAA can cause two serious problems:  It can enlarge and burst (rupture).  It can cause blood to flow between the layers of the wall of the aorta through a tear (aortic dissection). Both of these problems are medical emergencies. They can cause bleeding inside the body. If they are not diagnosed and treated right away, they can be life-threatening. What are the causes? The exact cause of this condition is not known. What increases the risk? The following factors may make you more likely to develop this condition:  Being a male 60 years of age or older.  Being Caucasian.  Using tobacco or having a history of tobacco use.  Having a family history of aneurysms.  Having any of the following conditions: ? Hardening of the arteries (arteriosclerosis). ? Inflammation of the walls of an artery (arteritis). ? Certain genetic conditions. ? Obesity. ? An infection in the wall of the aorta (infectious aortitis) caused by bacteria. ? High cholesterol. ? High blood pressure (hypertension). What are the signs or symptoms? Symptoms of this condition vary depending on the size of the aneurysm and how fast it is growing. Most aneurysms grow slowly and do not cause any symptoms. When symptoms do occur, they may include:  Pain in the abdomen, side, or lower back.  Feeling full after eating only small amounts of food.  Feeling a pulsating lump in the abdomen. Symptoms that the aneurysm has ruptured include:  A sudden onset of severe pain in the abdomen, side, or back.  Nausea or vomiting.  Feeling light-headed or  passing out. How is this diagnosed? This condition may be diagnosed with:  A physical exam to check for throbbing and to listen to blood flow in your abdomen.  Tests, such as: ? Ultrasound. ? X-rays. ? CT scan. ? MRI. ? Tests to check your arteries for damage or blockage (angiogram). Because most unruptured AAAs cause no symptoms, they are often found during exams for other conditions. How is this treated? Treatment for this condition depends on:  The size of the aneurysm.  How fast the aneurysm is growing.  Your age.  Risk factors for rupture. If your aneurysm is smaller than 2 inches (5 cm), your health care provider may manage it by:  Checking (monitoring) it regularly to see if it is getting bigger. Depending on the size of the aneurysm, how fast it is growing, and your other risk factors, you may have an ultrasound to monitor it every 3-6 months, every year, or every few years.  Giving you medicines to control blood pressure, treat pain, or fight infection. If your aneurysm is larger than 2 inches (5 cm), your health care provider may do surgery to repair it. Follow these instructions at home: Lifestyle  Do not use any products that contain nicotine or tobacco, such as cigarettes, e-cigarettes, and chewing tobacco. If you need help quitting, ask your health care provider.  Stay physically active and exercise regularly. Talk with your health care provider about how often you should exercise and which types of exercise are safe for you. Eating and drinking    Eat a heart-healthy diet. This includes plenty of fresh fruits and vegetables, whole grains, low-fat (lean) protein, and low-fat dairy products.  Avoid foods that are high in saturated fat and cholesterol, such as red meat and some dairy products.  Do not drink alcohol if: ? Your health care provider tells you not to drink. ? You are pregnant, may be pregnant, or are planning to become pregnant.  If you drink  alcohol: ? Limit how much you use to:  0-1 drink a day for women.  0-2 drinks a day for men. ? Be aware of how much alcohol is in your drink. In the [place] one drink equals one typical bottle of beer (12 oz), one-half glass of wine (5 oz), or one shot of hard liquor (1 oz). General instructions  Take over-the-counter and prescription medicines only as told by your health care provider.  Keep your blood pressure within a normal range. Check it regularly, and ask your health care provider what your target blood pressure should be.  Have your blood sugar (glucose) level and cholesterol levels checked regularly. Follow your health care provider's instructions on how to keep levels within normal limits.  Avoid heavy lifting and activities that take a lot of effort (are strenuous). Ask your health care provider what activities are safe for you.  Keep all follow-up visits as told by your health care provider. This is important. Contact a health care provider if you:  Have pain in your abdomen, side, or back.  Have a throbbing feeling in your abdomen. Get help right away if you:  Have sudden, severe pain in your abdomen, side, or back.  Experience nausea or vomiting.  Have constipation or problems urinating.  Feel light-headed.  Have a rapid heart rate when you stand.  Have sweaty, clammy skin.  Have shortness of breath.  Have a fever. These symptoms may represent a serious problem that is an emergency. Do not wait to see if the symptoms will go away. Get medical help right away. Call your local emergency services (911 in the [place] Do not drive yourself to the hospital. Summary  An aneurysm is a bulge in an artery. It happens when blood pushes against a weakened or damaged artery wall.  Being older, male, Caucasian, having a history of tobacco use, and a family history of aneurysms can increase the risk.  These problems can cause bleeding inside the body and can be  life-threatening. Get medical help right away. This information is not intended to replace advice given to you by your health care provider. Make sure you discuss any questions you have with your health care provider. Document Released: 04/18/2005 Document Revised: 02/15/2018 Document Reviewed: 02/15/2018 Elsevier Interactive Patient Education  2019 [employer]  

## 2018-10-06 NOTE — Progress Notes (Signed)
Cardiology Office Note:    Date:  10/07/2018   ID:  Nathan Hess, DOB 1951-01-25, MRN [id number]  PCP:  Bradd Canary, MD  Cardiologist:  Norman Herrlich, MD   Referring MD: Bradd Canary, MD  ASSESSMENT:    1. Abdominal aortic aneurysm (AAA) without rupture (HCC)   2. Essential hypertension   3. Tobacco use disorder   4. Calculus of gallbladder without cholecystitis without obstruction    PLAN:      In order of problems listed above:  He has a small abdominal aortic aneurysm that appears asymptomatic.  I reviewed the natural history of abdominal aortic aneurysm and necessity of good blood pressure control and treatment of lipids and made a decision will recheck a duplex in 6 months to track a trajectory and make arrangements for follow-up if he progresses quickly or exceeds 5 cm would need to consider elective intervention.  He is comfortable with this approach  Poorly controlled he has not taken ACE inhibitor we reviewed options for treatment I placed him on a ARB diuretic combination version by home blood pressure cuff for monitoring goal less than 140 systolic and he will follow-up with his PCP regarding rechecking renal function potassium Counseled to stop smoking that it does affect peripheral vasculature favors aneurysms he voiced understanding His visit to the emergency room in December sound like typical biliary colic if not for the coronavirus outbreak have referred him to surgery at this time but I told him that he should consider elective surgical consultation once procedures are being done electively and if he has another episode would need to be evaluated urgently. Hyperlipidemia stable continue statin  Next appointment   Medication Adjustments/Labs and Tests Ordered: Current medicines are reviewed at length with the patient today.  Concerns regarding medicines are outlined above.  Orders Placed This Encounter  Procedures  . EKG 12-Lead   Meds ordered this  encounter  Medications  . telmisartan-hydrochlorothiazide (MICARDIS HCT) 40-12.5 MG tablet    Sig: Take 1 tablet by mouth daily.    Dispense:  30 tablet    Refill:  5  . aspirin EC 81 MG tablet    Sig: Take 1 tablet (81 mg total) by mouth daily.    Dispense:  90 tablet    Refill:  3     Chief Complaint  Patient presents with  . AAA    History of Present Illness:    Nathan Hess is a 68 y.o. male for diabetes hypertension hyperlipidemia and symptomatic cholelithiasis who is being seen today for the evaluation of abdominal aortic aneurysm at the request of Bradd Canary, MD. He has a history of small abdominal aortic aneurysm 3.2 cm on CT scan.  07/14/2018 he had epigastric pain radiated to his right upper quadrant fairly severe seen in emergency room at [facility] and I do not understand but I cannot access his records through care everywhere further evaluation a CT scan of the abdomen was found at 3.2 cm abdominal aortic aneurysm and cholelithiasis.  I do not have the report he tells me his PCP is requested.  None of his symptoms sounded anginal he has no chest pain shortness of breath edema palpitation or syncope.  He has hypertension but has not taken his ACE inhibitor hyperlipidemia on a statin.  He has no known heart disease he is an ongoing cigarette smoker.  Somewhat surprised he did not have a screening duplex at age 82 with welcome to Medicare exam.  He has had no symptoms referable to his abdominal aorta  Past Medical History:  Diagnosis Date  . Depression   . Diabetes mellitus type 2 in obese (HCC) 05/25/2012  . Enlarged prostate   . Hearing loss in left ear 01/08/2017  . History of colonic polyps 06/26/2015   3rd colonoscopy in 2016 with Dr Grayling Congress in [place]  . Hyperlipidemia 05/25/2012  . Left knee DJD   . Migraine headache 08/03/2012  . Other malaise and fatigue 11/01/2012  . Preventative health care 09/22/2014  . Salivary gland stone 02/28/2016  . Tobacco  use disorder 10/30/2012  . Unspecified essential hypertension 05/25/2012   Does not see a cardiologist    Past Surgical History:  Procedure Laterality Date  . left hand surgery    . PARTIAL KNEE ARTHROPLASTY Left 04/06/2013   Procedure: UNICOMPARTMENTAL LEFT KNEE;  Surgeon: Nilda Simmer, MD;  Location: [facility] OR;  Service: Orthopedics;  Laterality: Left;  . UMBILICAL HERNIA REPAIR  2017   w/ small ventral ex mesh    Current Medications: Current Meds  Medication Sig  . rosuvastatin (CRESTOR) 20 MG tablet Take 1 tablet (20 mg total) by mouth daily.  . tadalafil (ADCIRCA/CIALIS) 20 MG tablet Take 1 tablet (20 mg total) by mouth daily as needed for erectile dysfunction.  . triamcinolone cream (KENALOG) 0.1 % Apply 1 application topically 2 (two) times daily.  . [DISCONTINUED] aspirin 325 MG tablet Take 1 tablet (325 mg total) by mouth daily.  . [DISCONTINUED] lisinopril (PRINIVIL,ZESTRIL) 5 MG tablet Take 1 tablet (5 mg total) by mouth daily.     Allergies:   Chantix [varenicline]   Social History   Socioeconomic History  . Marital status: Married    Spouse name: Not on file  . Number of children: Not on file  . Years of education: Not on file  . Highest education level: Not on file  Occupational History  . Not on file  Social Needs  . Financial resource strain: Not on file  . Food insecurity:    Worry: Not on file    Inability: Not on file  . Transportation needs:    Medical: Not on file    Non-medical: Not on file  Tobacco Use  . Smoking status: Current Every Day Smoker    Packs/day: 1.00    Years: 15.00    Pack years: 15.00    Types: Cigarettes  . Smokeless tobacco: Never Used  Substance and Sexual Activity  . Alcohol use: Yes    Comment: 1 beer about every 2 months   . Drug use: No  . Sexual activity: Yes    Birth control/protection: Condom  Lifestyle  . Physical activity:    Days per week: Not on file    Minutes per session: Not on file  . Stress: Not on file   Relationships  . Social connections:    Talks on phone: Not on file    Gets together: Not on file    Attends religious service: Not on file    Active member of club or organization: Not on file    Attends meetings of clubs or organizations: Not on file    Relationship status: Not on file  Other Topics Concern  . Not on file  Social History Narrative  . Not on file     Family History: The patient's family history includes Alcohol abuse in his maternal grandfather; Alzheimer's disease in his mother; Cancer in his paternal grandmother; Diabetes in his brother; Heart  disease in his paternal grandfather; Hypertension in his father.  ROS:   Review of Systems  Constitution: Negative.  HENT: Negative.   Eyes: Negative.   Cardiovascular: Negative.   Respiratory: Negative.   Endocrine: Negative.   Hematologic/Lymphatic: Negative.   Skin: Negative.   Musculoskeletal: Positive for joint pain and muscle weakness.  Gastrointestinal: Positive for abdominal pain.  Genitourinary: Negative.   Neurological: Positive for weakness.  Psychiatric/Behavioral: Negative.   Allergic/Immunologic: Negative.    Please see the history of present illness.     All other systems reviewed and are negative.  EKGs/Labs/Other Studies Reviewed:    The following studies were reviewed today:   EKG:  EKG is  ordered today.  The ekg ordered today is personally reviewed and demonstrates sinus rhythm and is normal  Recent Labs: 10/02/2018: ALT 32; BUN 17; Creatinine, Ser 0.82; Hemoglobin 15.4; Platelets 214.0; Potassium 4.4; Sodium 140; TSH 1.92  Recent Lipid Panel    Component Value Date/Time   CHOL 192 10/02/2018 0758   TRIG 78.0 10/02/2018 0758   HDL 44.10 10/02/2018 0758   CHOLHDL 4 10/02/2018 0758   VLDL 15.6 10/02/2018 0758   LDLCALC 132 (H) 10/02/2018 0758    Physical Exam:    VS:  BP (!) 162/92 (BP Location: Left Arm, Patient Position: Sitting, Cuff Size: Large)   Pulse 78   Ht 5\' 11"  (1.803  m)   Wt 230 lb 12.8 oz (104.7 kg)   SpO2 97%   BMI 32.19 kg/m     Wt Readings from Last 3 Encounters:  10/07/18 230 lb 12.8 oz (104.7 kg)  10/06/18 229 lb 6.4 oz (104.1 kg)  04/03/18 220 lb 9.6 oz (100.1 kg)     GEN: He looks older than his age and somewhat chronically ill well nourished, well developed in no acute distress HEENT: Normal NECK: No JVD; No carotid bruits LYMPHATICS: No lymphadenopathy CARDIAC: RRR, no murmurs, rubs, gallops RESPIRATORY:  Clear to auscultation without rales, wheezing or rhonchi  ABDOMEN: Soft, non-tender, non-distended MUSCULOSKELETAL:  No edema; No deformity  SKIN: Warm and dry NEUROLOGIC:  Alert and oriented x 3 PSYCHIATRIC:  Normal affect     Signed, Norman Herrlich, MD  10/07/2018 1:07 PM    [facility]

## 2018-10-06 NOTE — Assessment & Plan Note (Addendum)
Had an episode of epigastric pain spreading to the RUQ so he presented to ER in [place] on December 23 and CT scan confirmed stones and AAA. No further episodes. Encouraged to minimizes fats and let us know if occurs again.

## 2018-10-07 ENCOUNTER — Encounter: Payer: Self-pay | Admitting: Cardiology

## 2018-10-07 ENCOUNTER — Other Ambulatory Visit: Payer: Self-pay

## 2018-10-07 ENCOUNTER — Ambulatory Visit (HOSPITAL_BASED_OUTPATIENT_CLINIC_OR_DEPARTMENT_OTHER)
Admission: RE | Admit: 2018-10-07 | Discharge: 2018-10-07 | Disposition: A | Discharge status: Home / Self Care | Payer: BLUE CROSS/BLUE SHIELD | Source: Ambulatory Visit | Attending: Family Medicine | Admitting: Family Medicine

## 2018-10-07 ENCOUNTER — Ambulatory Visit: Payer: BLUE CROSS/BLUE SHIELD | Admitting: Cardiology

## 2018-10-07 VITALS — BP 162/92 | HR 78 | Ht 71.0 in | Wt 230.8 lb

## 2018-10-07 DIAGNOSIS — I714 Abdominal aortic aneurysm, without rupture, unspecified: Secondary | ICD-10-CM

## 2018-10-07 DIAGNOSIS — F17200 Nicotine dependence, unspecified, uncomplicated: Secondary | ICD-10-CM

## 2018-10-07 DIAGNOSIS — I10 Essential (primary) hypertension: Secondary | ICD-10-CM | POA: Diagnosis not present

## 2018-10-07 DIAGNOSIS — K8020 Calculus of gallbladder without cholecystitis without obstruction: Secondary | ICD-10-CM

## 2018-10-07 DIAGNOSIS — Z122 Encounter for screening for malignant neoplasm of respiratory organs: Secondary | ICD-10-CM | POA: Insufficient documentation

## 2018-10-07 DIAGNOSIS — F17210 Nicotine dependence, cigarettes, uncomplicated: Secondary | ICD-10-CM | POA: Diagnosis not present

## 2018-10-07 MED ORDER — TELMISARTAN-HCTZ 40-12.5 MG PO TABS: 1.0000 | ORAL_TABLET | Freq: Every day | ORAL | 5 refills | Status: DC

## 2018-10-07 MED ORDER — ASPIRIN EC 81 MG PO TBEC: 81.0000 mg | DELAYED_RELEASE_TABLET | Freq: Every day | ORAL | 3 refills | Status: DC

## 2018-10-07 NOTE — Patient Instructions (Addendum)
Medication Instructions:  Your physician has recommended you make the following change in your medication:  DECREASE: Aspirin EC 81 mg daily STOP: Lisinopril  START:  Telmisartan 40/12.5mg  one tablet daily  If you need a refill on your cardiac medications before your next appointment, please call your pharmacy.   Lab work: NONE If you have labs (blood work) drawn today and your tests are completely normal, you will receive your results only by: Marland Kitchen MyChart Message (if you have MyChart) OR . A paper copy in the mail If you have any lab test that is abnormal or we need to change your treatment, we will call you to review the results.  Testing/Procedures: You had an EKG today.  Your physician has requested that you have an abdominal aorta duplex. During this test, an ultrasound is used to evaluate the aorta. Allow 30 minutes for this exam. Do not eat after midnight the day before and avoid carbonated beverages   Follow-Up: At [facility], you and your health needs are our priority.  As part of our continuing mission to provide you with exceptional heart care, we have created designated Provider Care Teams.  These Care Teams include your primary Cardiologist (physician) and Advanced Practice Providers (APPs -  Physician Assistants and Nurse Practitioners) who all work together to provide you with the care you need, when you need it. You will need a follow up appointment in 6 months.      Abdominal Aortic Aneurysm  An aneurysm is a bulge in one of the blood vessels that carry blood away from the heart (artery). It happens when blood pushes up against a weak or damaged place in the wall of an artery. An abdominal aortic aneurysm happens in the main artery of the body (aorta). Some aneurysms may not cause problems. If it grows, it can burst or tear, causing bleeding inside the body. This is an emergency. It needs to be treated right away. What are the causes? The exact cause of this  condition is not known. What increases the risk? The following may make you more likely to get this condition:  Being a male who is 68 years of age or older.  Being white (Caucasian).  Using tobacco.  Having a family history of aneurysms.  Having the following conditions: ? Hardening of the arteries (arteriosclerosis). ? Inflammation of the walls of an artery (arteritis). ? Certain genetic conditions. ? Being very overweight (obesity). ? An infection in the wall of the aorta (infectious aortitis). ? High cholesterol. ? High blood pressure (hypertension). What are the signs or symptoms? Symptoms depend on the size of the aneurysm and how fast it is growing. Most grow slowly and do not cause any symptoms. If symptoms do occur, they may include:  Pain in the belly (abdomen), side, or back.  Feeling full after eating only small amounts of food.  Feeling a throbbing lump in the belly. Symptoms that the aneurysm has burst (ruptured) include:  Sudden, very bad pain in the belly, side, or back.  Feeling sick to your stomach (nauseous).  Throwing up (vomiting).  Feeling light-headed or passing out. How is this treated? Treatment for this condition depends on:  The size of the aneurysm.  How fast it is growing.  Your age.  Your risk of having it burst. If your aneurysm is smaller than 2 inches (5 cm), your doctor may manage it by:  Checking it often to see if it is getting bigger. You may have an imaging  test (ultrasound) to check it every 3-6 months, every year, or every few years.  Giving you medicines to: ? Control blood pressure. ? Treat pain. ? Fight infection. If your aneurysm is larger than 2 inches (5 cm), you may need surgery to fix it. Follow these instructions at home: Lifestyle  Do not use any products that have nicotine or tobacco in them. This includes cigarettes, e-cigarettes, and chewing tobacco. If you need help quitting, ask your doctor.  Get  regular exercise. Ask your doctor what types of exercise are best for you. Eating and drinking  Eat a heart-healthy diet. This includes eating plenty of: ? Fresh fruits and vegetables. ? Whole grains. ? Low-fat (lean) protein. ? Low-fat dairy products.  Avoid foods that are high in saturated fat and cholesterol. These foods include red meat and some dairy products.  Do not drink alcohol if: ? Your doctor tells you not to drink. ? You are pregnant, may be pregnant, or are planning to become pregnant.  If you drink alcohol: ? Limit how much you use to:  0-1 drink a day for women.  0-2 drinks a day for men. ? Be aware of how much alcohol is in your drink. In the [place] one drink equals any of these:  One typical bottle of beer (12 oz).  One-half glass of wine (5 oz).  One shot of hard liquor (1 oz). General instructions  Take over-the-counter and prescription medicines only as told by your doctor.  Keep your blood pressure within normal limits. Ask your doctor what your blood pressure should be.  Have your blood sugar (glucose) level and cholesterol levels checked regularly. Keep your blood sugar level and cholesterol levels within normal limits.  Avoid heavy lifting and activities that take a lot of effort. Ask your doctor what activities are safe for you.  Keep all follow-up visits as told by your doctor. This is important. ? Talk to your doctor about regular screenings to see if the aneurysm is getting bigger. Contact a doctor if you:  Have pain in your belly, side, or back.  Have a throbbing feeling in your belly.  Have a family history of aneurysms. Get help right away if you:  Have sudden, bad pain in your belly, side, or back.  Feel sick to your stomach.  Throw up.  Have trouble pooping (constipation).  Have trouble peeing (urinating).  Feel light-headed.  Have a fast heart rate when you stand.  Have sweaty skin that is cold to the touch (clammy).   Have shortness of breath.  Have a fever. These symptoms may be an emergency. Do not wait to see if the symptoms will go away. Get medical help right away. Call your local emergency services (911 in the [place] Do not drive yourself to the hospital. Summary  An aneurysm is a bulge in one of the blood vessels that carry blood away from the heart (artery). Some aneurysms may not cause problems.  You may need to have yours checked often. If it grows, it can burst or tear. This causes bleeding inside the body. It needs to be treated right away.  Follow instructions from your doctor about healthy lifestyle changes.  Keep all follow-up visits as told by your doctor. This is important. This information is not intended to replace advice given to you by your health care provider. Make sure you discuss any questions you have with your health care provider. Document Released: 11/03/2012 Document Revised: 02/15/2018 Document Reviewed: 02/15/2018 Elsevier  Interactive Patient Education  2019 Elsevier Inc.  [employer]  Tips to measure your blood pressure correctly  To determine whether you have hypertension, a medical professional will take a blood pressure reading. How you prepare for the test, the position of your arm, and other factors can change a blood pressure reading by 10% or more. That could be enough to hide high blood pressure, start you on a drug you don't really need, or lead your doctor to incorrectly adjust your medications. National and international guidelines offer specific instructions for measuring blood pressure. If a doctor, nurse, or medical assistant isn't doing it right, don't hesitate to ask him or her to get with the guidelines. Here's what you can do to ensure a correct reading: . Don't drink a caffeinated beverage or smoke during the 30 minutes before the test. . Sit quietly for five minutes before the test begins. . During the measurement, sit in a chair with your feet on the  floor and your arm supported so your elbow is at about heart level. . The inflatable part of the cuff should completely cover at least 80% of your upper arm, and the cuff should be placed on bare skin, not over a shirt. . Don't talk during the measurement. . Have your blood pressure measured twice, with a brief break in between. If the readings are different by 5 points or more, have it done a third time. There are times to break these rules. If you sometimes feel lightheaded when getting out of bed in the morning or when you stand after sitting, you should have your blood pressure checked while seated and then while standing to see if it falls from one position to the next. Because blood pressure varies throughout the day, your doctor will rarely diagnose hypertension on the basis of a single reading. Instead, he or she will want to confirm the measurements on at least two occasions, usually within a few weeks of one another. The exception to this rule is if you have a blood pressure reading of 180/110 mm Hg or higher. A result this high usually calls for prompt treatment. It's also a good idea to have your blood pressure measured in both arms at least once, since the reading in one arm (usually the right) may be higher than that in the left. A 2014 study in The American Journal of Medicine of nearly 3,400 people found average arm- to-arm differences in systolic blood pressure of about 5 points. The higher number should be used to make treatment decisions. In 2017, new guidelines from the American Heart Association, the [employer] of Cardiology, and nine other health organizations lowered the diagnosis of high blood pressure to 130/80 mm Hg or higher for all adults. The guidelines also redefined the various blood pressure categories to now include normal, elevated, Stage 1 hypertension, Stage 2 hypertension, and hypertensive crisis (see "Blood pressure categories"). Blood pressure categories  Blood  pressure category SYSTOLIC (upper number)  DIASTOLIC (lower number)  Normal Less than 120 mm Hg and Less than 80 mm Hg  Elevated 120-129 mm Hg and Less than 80 mm Hg  High blood pressure: Stage 1 hypertension 130-139 mm Hg or 80-89 mm Hg  High blood pressure: Stage 2 hypertension 140 mm Hg or higher or 90 mm Hg or higher  Hypertensive crisis (consult your doctor immediately) Higher than 180 mm Hg and/or Higher than 120 mm Hg  Source: American Heart Association and American Stroke Association. For more on getting  your blood pressure under control, buy Controlling Your Blood Pressure, a Special Health Report from [facility].    Hydrochlorothiazide, HCTZ; Telmisartan tablets What is this medicine? TELMISARTAN; HYDROCHLOROTHIAZIDE (tel mi SAR tan; hye droe klor oh THYE a zide) is a combination of a drug that relaxes blood vessels and a diuretic. It is used to treat high blood pressure. This medicine may be used for other purposes; ask your health care provider or pharmacist if you have questions. COMMON BRAND NAME(S): Micardis HCT What should I tell my health care provider before I take this medicine? They need to know if you have any of these conditions: -decreased urine -if you are on a special diet, like a low-salt diet -immune system problems, like lupus -kidney disease -liver disease -an unusual or allergic reaction to telmisartan, hydrochlorothiazide, sulfa drugs, other medicines, foods, dyes, or preservatives -pregnant or trying to get pregnant -breast-feeding How should I use this medicine? Take this medicine by mouth with a drink of water. Follow the directions on the prescription label. You can take it with or without food. If it upsets your stomach, take it with food. Take your medicine at regular intervals. Do not take it more often than directed. Do not stop taking except on your doctor's advice. Talk to your pediatrician regarding the use of this medicine in  children. Special care may be needed. Overdosage: If you think you have taken too much of this medicine contact a poison control center or emergency room at once. NOTE: This medicine is only for you. Do not share this medicine with others. What if I miss a dose? If you miss a dose, take it as soon as you can. If it is almost time for your next dose, take only that dose. Do not take double or extra doses. What may interact with this medicine? -barbiturates, like phenobarbital -blood pressure medicines -corticosteroids -diabetic medicines -digoxin -diuretics, especially triamterene, spironolactone or amiloride -lithium -NSAIDs, medicines for pain and inflammation, like ibuprofen or naproxen -potassium salts or potassium supplements -prescription pain medicines -skeletal muscle relaxants like tubocurarine -some cholesterol lowering medicines like cholestyramine or colestipol -warfarin This list may not describe all possible interactions. Give your health care provider a list of all the medicines, herbs, non-prescription drugs, or dietary supplements you use. Also tell them if you smoke, drink alcohol, or use illegal drugs. Some items may interact with your medicine. What should I watch for while using this medicine? Check your blood pressure regularly while you are taking this medicine. Ask your doctor or health care professional what your blood pressure should be and when you should contact him or her. When you check your blood pressure, write down the measurements to show your doctor or health care professional. If you are taking this medicine for a long time, you must visit your health care professional for regular checks on your progress. Make sure you schedule appointments on a regular basis. You must not get dehydrated. Ask your doctor or health care professional how much fluid you need to drink a day. Check with him or her if you get an attack of severe diarrhea, nausea and vomiting, or if  you sweat a lot. The loss of too much body fluid can make it dangerous for you to take this medicine. Women should inform their doctor if they wish to become pregnant or think they might be pregnant. There is a potential for serious side effects to an unborn child, particularly in the second or third trimester.  Talk to your health care professional or pharmacist for more information. You may get drowsy or dizzy. Do not drive, use machinery, or do anything that needs mental alertness until you know how this drug affects you. Do not stand or sit up quickly, especially if you are an older patient. This reduces the risk of dizzy or fainting spells. Alcohol can make you more drowsy and dizzy. Avoid alcoholic drinks. This medicine may affect your blood sugar level. If you have diabetes, check with your doctor or health care professional before changing the dose of your diabetic medicine. Avoid salt substitutes unless you are told otherwise by your doctor or health care professional. Do not treat yourself for coughs, colds, or pain while you are taking this medicine without asking your doctor or health care professional for advice. Some ingredients may increase your blood pressure. This medicine can make you more sensitive to the sun. Keep out of the sun. If you cannot avoid being in the sun, wear protective clothing and use sunscreen. Do not use sun lamps or tanning beds/booths. What side effects may I notice from receiving this medicine? Side effects that you should report to your doctor or health care professional as soon as possible: -allergic reactions like skin rash, itching or hives, swelling of the face, lips, or tongue -breathing problems -changes in vision -dark urine -eye pain -fast or irregular heart beat, palpitations, or chest pain -feeling faint or lightheaded -muscle cramps -persistent dry cough -redness, blistering, peeling or loosening of the skin, including inside the mouth -stomach  pain -trouble passing urine or change in the amount of urine -unusual bleeding or bruising -worsened gout pain -yellowing of the eyes or skin Side effects that usually do not require medical attention (report to your doctor or health care professional if they continue or are bothersome): -change in sex drive or performance -headache This list may not describe all possible side effects. Call your doctor for medical advice about side effects. You may report side effects to FDA at 1-800-FDA-1088. Where should I keep my medicine? Keep out of the reach of children. Store at room temperature between 15 and 30 degrees C (59 and 86 degrees F). Tablets should not be removed from the blisters until right before use. Throw away any unused medicine after the expiration date. NOTE: This sheet is a summary. It may not cover all possible information. If you have questions about this medicine, talk to your doctor, pharmacist, or health care provider.  2019 Elsevier/Gold Standard (2010-03-29 14:52:10)

## 2018-10-29 ENCOUNTER — Other Ambulatory Visit: Payer: Self-pay | Admitting: Family Medicine

## 2019-02-09 DIAGNOSIS — M6240 Contracture of muscle, unspecified site: Secondary | ICD-10-CM | POA: Diagnosis not present

## 2019-02-09 DIAGNOSIS — M7060 Trochanteric bursitis, unspecified hip: Secondary | ICD-10-CM | POA: Diagnosis not present

## 2019-02-09 DIAGNOSIS — M5126 Other intervertebral disc displacement, lumbar region: Secondary | ICD-10-CM | POA: Diagnosis not present

## 2019-02-09 DIAGNOSIS — M5020 Other cervical disc displacement, unspecified cervical region: Secondary | ICD-10-CM | POA: Diagnosis not present

## 2019-04-01 ENCOUNTER — Other Ambulatory Visit: Payer: Self-pay

## 2019-04-01 ENCOUNTER — Ambulatory Visit (HOSPITAL_BASED_OUTPATIENT_CLINIC_OR_DEPARTMENT_OTHER)
Admission: RE | Admit: 2019-04-01 | Discharge: 2019-04-01 | Disposition: A | Discharge status: Home / Self Care | Payer: BC Managed Care – PPO | Source: Ambulatory Visit | Attending: Cardiology | Admitting: Cardiology

## 2019-04-01 ENCOUNTER — Other Ambulatory Visit: Payer: Self-pay | Admitting: Cardiology

## 2019-04-01 DIAGNOSIS — I714 Abdominal aortic aneurysm, without rupture, unspecified: Secondary | ICD-10-CM

## 2019-04-01 NOTE — Progress Notes (Signed)
Abdominal aorta doppler performed    04/01/19 Cardell Peach RDCS, RVT

## 2019-04-09 DIAGNOSIS — Z23 Encounter for immunization: Secondary | ICD-10-CM | POA: Diagnosis not present

## 2019-04-15 ENCOUNTER — Other Ambulatory Visit (INDEPENDENT_AMBULATORY_CARE_PROVIDER_SITE_OTHER): Payer: BC Managed Care – PPO

## 2019-04-15 ENCOUNTER — Other Ambulatory Visit: Payer: Self-pay

## 2019-04-15 DIAGNOSIS — R351 Nocturia: Secondary | ICD-10-CM | POA: Diagnosis not present

## 2019-04-15 DIAGNOSIS — E1169 Type 2 diabetes mellitus with other specified complication: Secondary | ICD-10-CM

## 2019-04-15 DIAGNOSIS — E669 Obesity, unspecified: Secondary | ICD-10-CM

## 2019-04-15 DIAGNOSIS — I10 Essential (primary) hypertension: Secondary | ICD-10-CM

## 2019-04-15 DIAGNOSIS — E785 Hyperlipidemia, unspecified: Secondary | ICD-10-CM | POA: Diagnosis not present

## 2019-04-15 LAB — COMPREHENSIVE METABOLIC PANEL
ALT: 31 U/L (ref 0–53)
AST: 21 U/L (ref 0–37)
Albumin: 4.3 g/dL (ref 3.5–5.2)
Alkaline Phosphatase: 57 U/L (ref 39–117)
BUN: 21 mg/dL (ref 6–23)
CO2: 27 mEq/L (ref 19–32)
Calcium: 9 mg/dL (ref 8.4–10.5)
Chloride: 102 mEq/L (ref 96–112)
Creatinine, Ser: 0.81 mg/dL (ref 0.40–1.50)
GFR: 94.6 mL/min (ref >60.00)
Glucose, Bld: 196 mg/dL — ABNORMAL HIGH (ref 70–99)
Potassium: 4.3 mEq/L (ref 3.5–5.1)
Sodium: 137 mEq/L (ref 135–145)
Total Bilirubin: 0.3 mg/dL (ref 0.2–1.2)
Total Protein: 6.7 g/dL (ref 6.0–8.3)

## 2019-04-15 LAB — LIPID PANEL
Cholesterol: 120 mg/dL (ref 0–200)
HDL: 40.3 mg/dL (ref >39.00)
LDL Cholesterol: 45 mg/dL (ref 0–99)
NonHDL: 79.32
Total CHOL/HDL Ratio: 3
Triglycerides: 170 mg/dL — ABNORMAL HIGH (ref 0.0–149.0)
VLDL: 34 mg/dL (ref 0.0–40.0)

## 2019-04-15 LAB — CBC
HCT: 40 % (ref 39.0–52.0)
Hemoglobin: 13.9 g/dL (ref 13.0–17.0)
MCHC: 34.8 g/dL (ref 30.0–36.0)
MCV: 95.5 fl (ref 78.0–100.0)
Platelets: 202 10*3/uL (ref 150.0–400.0)
RBC: 4.19 Mil/uL — ABNORMAL LOW (ref 4.22–5.81)
RDW: 12.4 % (ref 11.5–15.5)
WBC: 7.2 10*3/uL (ref 4.0–10.5)

## 2019-04-15 LAB — TSH: TSH: 1.66 u[IU]/mL (ref 0.35–4.50)

## 2019-04-15 LAB — HEMOGLOBIN A1C: Hgb A1c MFr Bld: 7.5 % — ABNORMAL HIGH (ref 4.6–6.5)

## 2019-04-15 LAB — PSA: PSA: 3.78 ng/mL (ref 0.10–4.00)

## 2019-04-18 NOTE — Progress Notes (Addendum)
Cardiology Office Note:    Date:  04/20/2019   ID:  Nathan Hess, DOB [birth year], MRN [id number]  PCP:  Bradd Canary, MD  Cardiologist:  Norman Herrlich, MD    Referring MD: Bradd Canary, MD    ASSESSMENT:    1. Abdominal aortic aneurysm (AAA) without rupture (HCC)   2. Essential hypertension   3. Mixed hyperlipidemia   4. Diabetes mellitus type 2 in obese (HCC)   5. Atherosclerosis of aorta (HCC)    PLAN:    In order of problems listed above:  1. Stable, plan is for treatment of hypertension continued cigarette cessation follow-up duplex in 1 year and see me soon afterwards 2. Stable BP at target continue ARB thiazide diuretic recent normal potassium and GFR 3. Stable continue a statin, triglycerides related to poorly controlled diabetes 4. Poorly controlled, he will see his PCP and encouraged him to resume metformin 5. Continue medical therapy including lipid-lowering antihypertensive and diabetes.   Next appointment: 1 year   Medication Adjustments/Labs and Tests Ordered: Current medicines are reviewed at length with the patient today.  Concerns regarding medicines are outlined above.  Orders Placed This Encounter  Procedures  . VAS [place] AAA DUPLEX   No orders of the defined types were placed in this encounter.   Chief Complaint  Patient presents with  . Follow-up    for AAA  . Hypertension    History of Present Illness:    Nathan Hess is a 68 y.o. male with a hx of small abdominal aortic aneurysm, hypertension, hyperlipidemia cholelithiasis  and T2 DM  last seen 10/07/2018 and hypertension was poorly controlled.  AAA duplex: 04/01/2019 Summary: Abdominal Aorta: There is evidence of abnormal dilatation of the mid Abdominal aorta. The largest aortic measurement is 3.2 cm. No previous exam available for comparison. IVC/Iliac: There is no evidence of thrombus involving the IVC. PREVIOUS 07/14/2018 3.2 cm by CT  Compliance with diet,  lifestyle and medications: Yes, he stopped smoking and is vaping takes blood pressure medicine regularly is not taking metformin.  Fortunately he has stopped smoking and is now walking 20 minutes a day he is frustrated by his A1c greater than 7 and acknowledges he has not been taking metformin.  I encouraged compliance and follow-up with his PCP.  Blood pressures at target tolerates his combination ARB diuretic and potassium and renal function are normal.  He has had no recurrent biliary colic.  No chest pain palpitation or syncope.  The pins are at target except for elevated triglycerides due to poorly controlled type 2 diabetes and has no muscle symptoms from his statin Past Medical History:  Diagnosis Date  . Depression   . Diabetes mellitus type 2 in obese (HCC) 05/25/2012  . Enlarged prostate   . Hearing loss in left ear 01/08/2017  . History of colonic polyps 06/26/2015   3rd colonoscopy in 2016 with Dr Grayling Congress in [place]  . Hyperlipidemia 05/25/2012  . Left knee DJD   . Migraine headache 08/03/2012  . Other malaise and fatigue 11/01/2012  . Preventative health care 09/22/2014  . Salivary gland stone 02/28/2016  . Tobacco use disorder 10/30/2012  . Unspecified essential hypertension 05/25/2012   Does not see a cardiologist    Past Surgical History:  Procedure Laterality Date  . left hand surgery    . PARTIAL KNEE ARTHROPLASTY Left 04/06/2013   Procedure: UNICOMPARTMENTAL LEFT KNEE;  Surgeon: Nilda Simmer, MD;  Location: [facility] OR;  Service: Orthopedics;  Laterality: Left;  . UMBILICAL HERNIA REPAIR  2017   w/ small ventral ex mesh    Current Medications: Current Meds  Medication Sig  . aspirin EC 81 MG tablet Take 1 tablet (81 mg total) by mouth daily.  . rosuvastatin (CRESTOR) 20 MG tablet Take 1 tablet (20 mg total) by mouth daily.  . tadalafil (ADCIRCA/CIALIS) 20 MG tablet Take 1 tablet (20 mg total) by mouth daily as needed for erectile dysfunction.  Marland Kitchen  telmisartan-hydrochlorothiazide (MICARDIS HCT) 40-12.5 MG tablet TAKE 1 TABLET BY MOUTH EVERY DAY  . triamcinolone cream (KENALOG) 0.1 % Apply 1 application topically 2 (two) times daily.     Allergies:   Chantix [varenicline]   Social History   Socioeconomic History  . Marital status: Married    Spouse name: Not on file  . Number of children: Not on file  . Years of education: Not on file  . Highest education level: Not on file  Occupational History  . Not on file  Social Needs  . Financial resource strain: Not on file  . Food insecurity    Worry: Not on file    Inability: Not on file  . Transportation needs    Medical: Not on file    Non-medical: Not on file  Tobacco Use  . Smoking status: Former Smoker    Packs/day: 1.00    Years: 15.00    Pack years: 15.00    Types: Cigarettes    Quit date: 02/2019    Years since quitting: 0.1  . Smokeless tobacco: Never Used  Substance and Sexual Activity  . Alcohol use: Yes    Comment: 1 beer about every 2 months   . Drug use: No  . Sexual activity: Yes    Birth control/protection: Condom  Lifestyle  . Physical activity    Days per week: Not on file    Minutes per session: Not on file  . Stress: Not on file  Relationships  . Social Herbalist on phone: Not on file    Gets together: Not on file    Attends religious service: Not on file    Active member of club or organization: Not on file    Attends meetings of clubs or organizations: Not on file    Relationship status: Not on file  Other Topics Concern  . Not on file  Social History Narrative  . Not on file     Family History: The patient's family history includes Alcohol abuse in his maternal grandfather; Alzheimer's disease in his mother; Cancer in his paternal grandmother; Diabetes in his brother; Heart disease in his paternal grandfather; Hypertension in his father. ROS:   Please see the history of present illness.    All other systems reviewed and are  negative.  EKGs/Labs/Other Studies Reviewed:    The following studies were reviewed today:   CT chest 10/06/2018: IMPRESSION: Lung-RADS 2, benign appearance or behavior. Continue annual screening with low-dose chest CT without contrast in 12 months. Aortic Atherosclerosis (ICD10-I70.0) and Emphysema (ICD10-J43.9).  Recent Labs: 04/15/2019: ALT 31; BUN 21; Creatinine, Ser 0.81; Hemoglobin 13.9; Platelets 202.0; Potassium 4.3; Sodium 137; TSH 1.66  Recent Lipid Panel    Component Value Date/Time   CHOL 120 04/15/2019 0743   TRIG 170.0 (H) 04/15/2019 0743   HDL 40.30 04/15/2019 0743   CHOLHDL 3 04/15/2019 0743   VLDL 34.0 04/15/2019 0743   LDLCALC 45 04/15/2019 0743    Physical Exam:    VS:  BP 132/80 (BP Location: Right Arm, Patient Position: Sitting, Cuff Size: Large)   Pulse 70   Ht 5\' 11"  (1.803 m)   Wt 232 lb (105.2 kg)   SpO2 98%   BMI 32.36 kg/m     Wt Readings from Last 3 Encounters:  04/20/19 232 lb (105.2 kg)  10/07/18 230 lb 12.8 oz (104.7 kg)  10/06/18 229 lb 6.4 oz (104.1 kg)     GEN:  Well nourished, well developed in no acute distress HEENT: Normal NECK: No JVD; No carotid bruits LYMPHATICS: No lymphadenopathy CARDIAC: RRR, no murmurs, rubs, gallops RESPIRATORY:  Clear to auscultation without rales, wheezing or rhonchi  ABDOMEN: Soft, non-tender, non-distended MUSCULOSKELETAL:  No edema; No deformity  SKIN: Warm and dry NEUROLOGIC:  Alert and oriented x 3 PSYCHIATRIC:  Normal affect    Signed, Norman HerrlichBrian Malik Ruffino, MD  04/20/2019 9:47 AM    [facility]

## 2019-04-20 ENCOUNTER — Ambulatory Visit (INDEPENDENT_AMBULATORY_CARE_PROVIDER_SITE_OTHER): Payer: BC Managed Care – PPO | Admitting: Cardiology

## 2019-04-20 ENCOUNTER — Other Ambulatory Visit: Payer: Self-pay

## 2019-04-20 ENCOUNTER — Encounter: Payer: Self-pay | Admitting: Cardiology

## 2019-04-20 VITALS — BP 132/80 | HR 70 | Ht 71.0 in | Wt 232.0 lb

## 2019-04-20 DIAGNOSIS — E669 Obesity, unspecified: Secondary | ICD-10-CM

## 2019-04-20 DIAGNOSIS — I714 Abdominal aortic aneurysm, without rupture, unspecified: Secondary | ICD-10-CM

## 2019-04-20 DIAGNOSIS — E1169 Type 2 diabetes mellitus with other specified complication: Secondary | ICD-10-CM

## 2019-04-20 DIAGNOSIS — E782 Mixed hyperlipidemia: Secondary | ICD-10-CM | POA: Diagnosis not present

## 2019-04-20 DIAGNOSIS — I10 Essential (primary) hypertension: Secondary | ICD-10-CM | POA: Diagnosis not present

## 2019-04-20 DIAGNOSIS — I700 Atherosclerosis of aorta: Secondary | ICD-10-CM

## 2019-04-20 NOTE — Patient Instructions (Signed)
Medication Instructions:  Your physician recommends that you continue on your current medications as directed. Please refer to the Current Medication list given to you today.  **Take your telmisartan-hydrochlorothiazide 40-12.5 mg: Take 1 tablet daily in the evening!**  If you need a refill on your cardiac medications before your next appointment, please call your pharmacy.   Lab work: None  If you have labs (blood work) drawn today and your tests are completely normal, you will receive your results only by: Marland Kitchen MyChart Message (if you have MyChart) OR . A paper copy in the mail If you have any lab test that is abnormal or we need to change your treatment, we will call you to review the results.  Testing/Procedures: Your physician has requested that you have an abdominal aorta duplex. During this test, an ultrasound is used to evaluate the aorta. Allow 30 minutes for this exam. Do not eat after midnight the day before and avoid carbonated beverages. This will be scheduled in September 2021.   Follow-Up: At [facility], you and your health needs are our priority.  As part of our continuing mission to provide you with exceptional heart care, we have created designated Provider Care Teams.  These Care Teams include your primary Cardiologist (physician) and Advanced Practice Providers (APPs -  Physician Assistants and Nurse Practitioners) who all work together to provide you with the care you need, when you need it. You will need a follow up appointment in 1 years.  Please call our office 2 months in advance to schedule this appointment.

## 2019-04-21 ENCOUNTER — Telehealth: Payer: Self-pay | Admitting: Family Medicine

## 2019-04-21 ENCOUNTER — Encounter: Payer: BLUE CROSS/BLUE SHIELD | Admitting: Family Medicine

## 2019-04-21 NOTE — Telephone Encounter (Signed)
Drue Dun, If you  Can find a location let me know. Please advise

## 2019-04-21 NOTE — Telephone Encounter (Signed)
Pt had an appt today for CPE and appt was canceled due to provider having a meeting today, pt would like to have his cpe schedule asap. Please call pt to reschedule cpe.

## 2019-05-12 DIAGNOSIS — Z23 Encounter for immunization: Secondary | ICD-10-CM | POA: Diagnosis not present

## 2019-05-25 ENCOUNTER — Ambulatory Visit (INDEPENDENT_AMBULATORY_CARE_PROVIDER_SITE_OTHER): Payer: BC Managed Care – PPO | Admitting: Family Medicine

## 2019-05-25 ENCOUNTER — Other Ambulatory Visit: Payer: Self-pay

## 2019-05-25 VITALS — BP 122/70 | HR 80 | Temp 97.9°F | Resp 18 | Wt 226.6 lb

## 2019-05-25 DIAGNOSIS — K5900 Constipation, unspecified: Secondary | ICD-10-CM

## 2019-05-25 DIAGNOSIS — I10 Essential (primary) hypertension: Secondary | ICD-10-CM

## 2019-05-25 DIAGNOSIS — Z122 Encounter for screening for malignant neoplasm of respiratory organs: Secondary | ICD-10-CM | POA: Diagnosis not present

## 2019-05-25 DIAGNOSIS — Z0000 Encounter for general adult medical examination without abnormal findings: Secondary | ICD-10-CM

## 2019-05-25 DIAGNOSIS — F17200 Nicotine dependence, unspecified, uncomplicated: Secondary | ICD-10-CM | POA: Diagnosis not present

## 2019-05-25 HISTORY — DX: Constipation, unspecified: K59.00

## 2019-05-25 NOTE — Assessment & Plan Note (Signed)
Patient encouraged to maintain heart healthy diet, regular exercise, adequate sleep. Consider daily probiotics. Take medications as prescribed 

## 2019-05-25 NOTE — Assessment & Plan Note (Signed)
Encouraged heart healthy diet, increase exercise, avoid trans fats, consider a krill oil cap daily, Tolerating rosuvastatin

## 2019-05-25 NOTE — Assessment & Plan Note (Signed)
Encouraged increased hydration and fiber in diet. Daily probiotics. If bowels not moving can use MOM 2 tbls po in 4 oz of warm prune juice by mouth every 2-3 days. If no results then repeat in 4 hours with  Dulcolax suppository pr, may repeat again in 4 more hours as needed. Seek care if symptoms worsen. Consider daily Miralax with Benefiber once to twice daily and/or Dulcolax if symptoms persist.

## 2019-05-25 NOTE — Assessment & Plan Note (Addendum)
Had his first CT low dose on 10/07/18. Repeat in 2021. Unfortunately he is smoking an occasional cigarette again and vaping at times. Is encouraged to try and stop both.

## 2019-05-25 NOTE — Patient Instructions (Addendum)
Pulse oximeter want oxygen 90s   Omron blood pressure cuff,  Upper arm  Weekly vitals   Multivitamin with minerals, selenium, zinc, vitamin c and d  Encouraged increased hydration and fiber in diet. Daily probiotics. If bowels not moving can use MOM 2 tbls po in 4 oz of warm prune juice by mouth every 2-3 days. If no results then repeat in 4 hours with  Dulcolax suppository pr, may repeat again in 4 more hours as needed. Seek care if symptoms worsen. Consider daily Miralax with benefiber once to twice daily and/or Dulcolax tab if symptoms persist.   Preventive Care 65 Years and Older, Male Preventive care refers to lifestyle choices and visits with your health care provider that can promote health and wellness. This includes:  A yearly physical exam. This is also called an annual well check.  Regular dental and eye exams.  Immunizations.  Screening for certain conditions.  Healthy lifestyle choices, such as diet and exercise. What can I expect for my preventive care visit? Physical exam Your health care provider will check:  Height and weight. These may be used to calculate body mass index (BMI), which is a measurement that tells if you are at a healthy weight.  Heart rate and blood pressure.  Your skin for abnormal spots. Counseling Your health care provider may ask you questions about:  Alcohol, tobacco, and drug use.  Emotional well-being.  Home and relationship well-being.  Sexual activity.  Eating habits.  History of falls.  Memory and ability to understand (cognition).  Work and work Statistician. What immunizations do I need?  Influenza (flu) vaccine  This is recommended every year. Tetanus, diphtheria, and pertussis (Tdap) vaccine  You may need a Td booster every 10 years. Varicella (chickenpox) vaccine  You may need this vaccine if you have not already been vaccinated. Zoster (shingles) vaccine  You may need this after age 53. Pneumococcal  conjugate (PCV13) vaccine  One dose is recommended after age 71. Pneumococcal polysaccharide (PPSV23) vaccine  One dose is recommended after age 59. Measles, mumps, and rubella (MMR) vaccine  You may need at least one dose of MMR if you were born in 1957 or later. You may also need a second dose. Meningococcal conjugate (MenACWY) vaccine  You may need this if you have certain conditions. Hepatitis A vaccine  You may need this if you have certain conditions or if you travel or work in places where you may be exposed to hepatitis A. Hepatitis B vaccine  You may need this if you have certain conditions or if you travel or work in places where you may be exposed to hepatitis B. Haemophilus influenzae type b (Hib) vaccine  You may need this if you have certain conditions. You may receive vaccines as individual doses or as more than one vaccine together in one shot (combination vaccines). Talk with your health care provider about the risks and benefits of combination vaccines. What tests do I need? Blood tests  Lipid and cholesterol levels. These may be checked every 5 years, or more frequently depending on your overall health.  Hepatitis C test.  Hepatitis B test. Screening  Lung cancer screening. You may have this screening every year starting at age 70 if you have a 30-pack-year history of smoking and currently smoke or have quit within the past 15 years.  Colorectal cancer screening. All adults should have this screening starting at age 23 and continuing until age 65. Your health care provider may recommend screening  at age 28 if you are at increased risk. You will have tests every 1-10 years, depending on your results and the type of screening test.  Prostate cancer screening. Recommendations will vary depending on your family history and other risks.  Diabetes screening. This is done by checking your blood sugar (glucose) after you have not eaten for a while (fasting). You may  have this done every 1-3 years.  Abdominal aortic aneurysm (AAA) screening. You may need this if you are a current or former smoker.  Sexually transmitted disease (STD) testing. Follow these instructions at home: Eating and drinking  Eat a diet that includes fresh fruits and vegetables, whole grains, lean protein, and low-fat dairy products. Limit your intake of foods with high amounts of sugar, saturated fats, and salt.  Take vitamin and mineral supplements as recommended by your health care provider.  Do not drink alcohol if your health care provider tells you not to drink.  If you drink alcohol: ? Limit how much you have to 0-2 drinks a day. ? Be aware of how much alcohol is in your drink. In the [place] one drink equals one 12 oz bottle of beer (355 mL), one 5 oz glass of wine (148 mL), or one 1 oz glass of hard liquor (44 mL). Lifestyle  Take daily care of your teeth and gums.  Stay active. Exercise for at least 30 minutes on 5 or more days each week.  Do not use any products that contain nicotine or tobacco, such as cigarettes, e-cigarettes, and chewing tobacco. If you need help quitting, ask your health care provider.  If you are sexually active, practice safe sex. Use a condom or other form of protection to prevent STIs (sexually transmitted infections).  Talk with your health care provider about taking a low-dose aspirin or statin. What's next?  Visit your health care provider once a year for a well check visit.  Ask your health care provider how often you should have your eyes and teeth checked.  Stay up to date on all vaccines. This information is not intended to replace advice given to you by your health care provider. Make sure you discuss any questions you have with your health care provider. Document Released: 08/05/2015 Document Revised: 07/03/2018 Document Reviewed: 07/03/2018 Elsevier Patient Education  2020 [employer].

## 2019-05-25 NOTE — Assessment & Plan Note (Signed)
hgba1c acceptable but whas trended up, minimize simple carbs. Increase exercise as tolerated. Continue current meds. He is managing with the Loose It APP.

## 2019-05-25 NOTE — Progress Notes (Signed)
Subjective:    Patient ID: Nathan Hess, male    DOB: [birth year], 68 y.o.   MRN: [id number]  No chief complaint on file.   HPI Patient is in today for annual preventative exam and follow up on chronic medical concerns including diabetes, obesity, tobacco use disorder and more. He  Feels well today but is frustrated with his weight gain during the pandemic, is active but not exercising regularly. He admits to eating more carbohydrates than usual and has smoked an occasional cigarette and vaped as well at times. No recent febrile illness or hospitalizations. He is maintaining quarantine most of the time. No polyuria or polydipsia. Does note life long constipation with only moving his bowels every 3 rd day or so. No bloody or tarry stool. Denies CP/palp/SOB/HA/congestion/fevers or GU c/o. Taking meds as prescribed  Past Medical History:  Diagnosis Date  . Depression   . Diabetes mellitus type 2 in obese ([place]) 05/25/2012  . Enlarged prostate   . Hearing loss in left ear 01/08/2017  . History of colonic polyps 06/26/2015   3rd colonoscopy in 2016 with Dr Lynita Lombard in [place]  . Hyperlipidemia 05/25/2012  . Left knee DJD   . Migraine headache 08/03/2012  . Other malaise and fatigue 11/01/2012  . Preventative health care 09/22/2014  . Salivary gland stone 02/28/2016  . Tobacco use disorder 10/30/2012  . Unspecified essential hypertension 05/25/2012   Does not see a cardiologist    Past Surgical History:  Procedure Laterality Date  . left hand surgery    . PARTIAL KNEE ARTHROPLASTY Left 04/06/2013   Procedure: UNICOMPARTMENTAL LEFT KNEE;  Surgeon: Lorn Junes, MD;  Location: [place];  Service: Orthopedics;  Laterality: Left;  . UMBILICAL HERNIA REPAIR  2017   w/ small ventral ex mesh    Family History  Problem Relation Age of Onset  . Alzheimer's disease Mother   . Hypertension Father   . Diabetes Brother        ?  Marland Kitchen Alcohol abuse Maternal Grandfather   . Cancer Paternal  Grandmother   . Heart disease Paternal Grandfather        MI    Social History   Socioeconomic History  . Marital status: Married    Spouse name: Not on file  . Number of children: Not on file  . Years of education: Not on file  . Highest education level: Not on file  Occupational History  . Not on file  Social Needs  . Financial resource strain: Not on file  . Food insecurity    Worry: Not on file    Inability: Not on file  . Transportation needs    Medical: Not on file    Non-medical: Not on file  Tobacco Use  . Smoking status: Former Smoker    Packs/day: 1.00    Years: 15.00    Pack years: 15.00    Types: Cigarettes    Quit date: 02/2019    Years since quitting: 0.2  . Smokeless tobacco: Never Used  Substance and Sexual Activity  . Alcohol use: Yes    Comment: 1 beer about every 2 months   . Drug use: No  . Sexual activity: Yes    Birth control/protection: Condom  Lifestyle  . Physical activity    Days per week: Not on file    Minutes per session: Not on file  . Stress: Not on file  Relationships  . Social Herbalist on phone: Not  on file    Gets together: Not on file    Attends religious service: Not on file    Active member of club or organization: Not on file    Attends meetings of clubs or organizations: Not on file    Relationship status: Not on file  . Intimate partner violence    Fear of current or ex partner: Not on file    Emotionally abused: Not on file    Physically abused: Not on file    Forced sexual activity: Not on file  Other Topics Concern  . Not on file  Social History Narrative  . Not on file    Outpatient Medications Prior to Visit  Medication Sig Dispense Refill  . aspirin EC 81 MG tablet Take 1 tablet (81 mg total) by mouth daily. 90 tablet 3  . rosuvastatin (CRESTOR) 20 MG tablet Take 1 tablet (20 mg total) by mouth daily. 90 tablet 3  . tadalafil (ADCIRCA/CIALIS) 20 MG tablet Take 1 tablet (20 mg total) by mouth  daily as needed for erectile dysfunction. 9 tablet 5  . telmisartan-hydrochlorothiazide (MICARDIS HCT) 40-12.5 MG tablet TAKE 1 TABLET BY MOUTH EVERY DAY 90 tablet 1  . triamcinolone cream (KENALOG) 0.1 % Apply 1 application topically 2 (two) times daily. 80 g 1   No facility-administered medications prior to visit.     Allergies  Allergen Reactions  . Chantix [Varenicline] Other (See Comments)    depression    Review of Systems  Constitutional: Negative for chills, fever and malaise/fatigue.  HENT: Negative for congestion and hearing loss.   Eyes: Negative for discharge.  Respiratory: Negative for cough, sputum production and shortness of breath.   Cardiovascular: Negative for chest pain, palpitations and leg swelling.  Gastrointestinal: Negative for abdominal pain, blood in stool, constipation, diarrhea, heartburn, nausea and vomiting.  Genitourinary: Negative for dysuria, frequency, hematuria and urgency.  Musculoskeletal: Negative for back pain, falls and myalgias.  Skin: Negative for rash.  Neurological: Negative for dizziness, sensory change, loss of consciousness, weakness and headaches.  Endo/Heme/Allergies: Negative for environmental allergies. Does not bruise/bleed easily.  Psychiatric/Behavioral: Negative for depression and suicidal ideas. The patient is not nervous/anxious and does not have insomnia.        Objective:    Physical Exam Vitals signs and nursing note reviewed.  Constitutional:      General: He is not in acute distress.    Appearance: Normal appearance. He is well-developed. He is obese. He is not ill-appearing.  HENT:     Head: Normocephalic and atraumatic.     Right Ear: Tympanic membrane, ear canal and external ear normal.     Left Ear: Tympanic membrane, ear canal and external ear normal.     Nose: Nose normal.  Eyes:     General:        Right eye: No discharge.        Left eye: No discharge.     Extraocular Movements: Extraocular movements  intact.     Conjunctiva/sclera: Conjunctivae normal.     Pupils: Pupils are equal, round, and reactive to light.  Neck:     Musculoskeletal: Normal range of motion and neck supple.  Cardiovascular:     Rate and Rhythm: Normal rate and regular rhythm.     Heart sounds: No murmur.  Pulmonary:     Effort: Pulmonary effort is normal.     Breath sounds: Normal breath sounds. No wheezing.  Abdominal:     General: Bowel sounds are  normal.     Palpations: Abdomen is soft. There is no mass.     Tenderness: There is no abdominal tenderness. There is no guarding.  Musculoskeletal:        General: No swelling or deformity.  Skin:    General: Skin is warm and dry.  Neurological:     Mental Status: He is alert and oriented to person, place, and time.     Deep Tendon Reflexes: Reflexes normal.  Psychiatric:        Mood and Affect: Mood normal.        Behavior: Behavior normal.     BP 122/70 (BP Location: Left Arm, Patient Position: Sitting, Cuff Size: Normal)   Pulse 80   Temp 97.9 F (36.6 C) (Temporal)   Resp 18   Wt 226 lb 9.6 oz (102.8 kg)   SpO2 98%   BMI 31.60 kg/m  Wt Readings from Last 3 Encounters:  05/25/19 226 lb 9.6 oz (102.8 kg)  04/20/19 232 lb (105.2 kg)  10/07/18 230 lb 12.8 oz (104.7 kg)    Diabetic Foot Exam - Simple   No data filed     Lab Results  Component Value Date   WBC 7.2 04/15/2019   HGB 13.9 04/15/2019   HCT 40.0 04/15/2019   PLT 202.0 04/15/2019   GLUCOSE 196 (H) 04/15/2019   CHOL 120 04/15/2019   TRIG 170.0 (H) 04/15/2019   HDL 40.30 04/15/2019   LDLCALC 45 04/15/2019   ALT 31 04/15/2019   AST 21 04/15/2019   NA 137 04/15/2019   K 4.3 04/15/2019   CL 102 04/15/2019   CREATININE 0.81 04/15/2019   BUN 21 04/15/2019   CO2 27 04/15/2019   TSH 1.66 04/15/2019   PSA 3.78 04/15/2019   INR 1.00 03/30/2013   HGBA1C 7.5 (H) 04/15/2019   MICROALBUR 0.6 01/08/2017    Lab Results  Component Value Date   TSH 1.66 04/15/2019   Lab Results   Component Value Date   WBC 7.2 04/15/2019   HGB 13.9 04/15/2019   HCT 40.0 04/15/2019   MCV 95.5 04/15/2019   PLT 202.0 04/15/2019   Lab Results  Component Value Date   NA 137 04/15/2019   K 4.3 04/15/2019   CO2 27 04/15/2019   GLUCOSE 196 (H) 04/15/2019   BUN 21 04/15/2019   CREATININE 0.81 04/15/2019   BILITOT 0.3 04/15/2019   ALKPHOS 57 04/15/2019   AST 21 04/15/2019   ALT 31 04/15/2019   PROT 6.7 04/15/2019   ALBUMIN 4.3 04/15/2019   CALCIUM 9.0 04/15/2019   GFR 94.60 04/15/2019   Lab Results  Component Value Date   CHOL 120 04/15/2019   Lab Results  Component Value Date   HDL 40.30 04/15/2019   Lab Results  Component Value Date   LDLCALC 45 04/15/2019   Lab Results  Component Value Date   TRIG 170.0 (H) 04/15/2019   Lab Results  Component Value Date   CHOLHDL 3 04/15/2019   Lab Results  Component Value Date   HGBA1C 7.5 (H) 04/15/2019       Assessment & Plan:   Problem List Items Addressed This Visit    Essential hypertension    Well controlled, no changes to meds. Encouraged heart healthy diet such as the DASH diet and exercise as tolerated.       Tobacco use disorder    Had his first CT low dose on 10/07/18. Repeat in 2021. Unfortunately he is smoking an occasional cigarette again  and vaping at times. Is encouraged to try and stop both.       Preventative health care    Patient encouraged to maintain heart healthy diet, regular exercise, adequate sleep. Consider daily probiotics. Take medications as prescribed      Constipation    Encouraged increased hydration and fiber in diet. Daily probiotics. If bowels not moving can use MOM 2 tbls po in 4 oz of warm prune juice by mouth every 2-3 days. If no results then repeat in 4 hours with  Dulcolax suppository pr, may repeat again in 4 more hours as needed. Seek care if symptoms worsen. Consider daily Miralax with Benefiber once to twice daily and/or Dulcolax if symptoms persist.        Other  Visit Diagnoses    Encounter for screening for lung cancer    -  Primary   Relevant Orders   CT CHEST LUNG CA SCREEN LOW DOSE W/O CM   Encounter for screening for malignant neoplasm of respiratory organs       Relevant Orders   CT CHEST LUNG CA SCREEN LOW DOSE W/O CM      I am having Neven C. Manzi maintain his rosuvastatin, tadalafil, triamcinolone cream, aspirin EC, and telmisartan-hydrochlorothiazide.  No orders of the defined types were placed in this encounter.    [name], MD

## 2019-05-25 NOTE — Assessment & Plan Note (Signed)
Well controlled, no changes to meds. Encouraged heart healthy diet such as the DASH diet and exercise as tolerated.  °

## 2019-05-25 NOTE — Assessment & Plan Note (Signed)
Encouraged DASH diet, decrease po intake and increase exercise as tolerated. Needs 7-8 hours of sleep nightly. Avoid trans fats, eat small, frequent meals every 4-5 hours with lean proteins, complex carbs and healthy fats. Minimize simple carbs, using the Loose It APP and is trying to loose 2 pounds a week.

## 2019-06-29 DIAGNOSIS — E119 Type 2 diabetes mellitus without complications: Secondary | ICD-10-CM | POA: Diagnosis not present

## 2019-06-29 LAB — HM DIABETES EYE EXAM

## 2019-08-25 ENCOUNTER — Ambulatory Visit (INDEPENDENT_AMBULATORY_CARE_PROVIDER_SITE_OTHER): Payer: BC Managed Care – PPO | Admitting: Family Medicine

## 2019-08-25 ENCOUNTER — Other Ambulatory Visit: Payer: Self-pay

## 2019-08-25 DIAGNOSIS — I10 Essential (primary) hypertension: Secondary | ICD-10-CM

## 2019-08-25 DIAGNOSIS — M545 Low back pain, unspecified: Secondary | ICD-10-CM

## 2019-08-25 DIAGNOSIS — E669 Obesity, unspecified: Secondary | ICD-10-CM

## 2019-08-25 DIAGNOSIS — F17200 Nicotine dependence, unspecified, uncomplicated: Secondary | ICD-10-CM | POA: Diagnosis not present

## 2019-08-25 DIAGNOSIS — F32A Depression, unspecified: Secondary | ICD-10-CM

## 2019-08-25 DIAGNOSIS — N401 Enlarged prostate with lower urinary tract symptoms: Secondary | ICD-10-CM

## 2019-08-25 DIAGNOSIS — F329 Major depressive disorder, single episode, unspecified: Secondary | ICD-10-CM

## 2019-08-25 DIAGNOSIS — E785 Hyperlipidemia, unspecified: Secondary | ICD-10-CM

## 2019-08-25 DIAGNOSIS — E1169 Type 2 diabetes mellitus with other specified complication: Secondary | ICD-10-CM

## 2019-08-25 DIAGNOSIS — R350 Frequency of micturition: Secondary | ICD-10-CM

## 2019-08-25 MED ORDER — TAMSULOSIN HCL 0.4 MG PO CAPS: 0.4000 mg | ORAL_CAPSULE | Freq: Every day | ORAL | 1 refills | Status: DC

## 2019-08-25 MED ORDER — METFORMIN HCL 500 MG PO TABS: 500.0000 mg | ORAL_TABLET | Freq: Every day | ORAL | 1 refills | Status: DC

## 2019-08-25 NOTE — Progress Notes (Signed)
Wants to talk about flomax

## 2019-08-26 DIAGNOSIS — N400 Enlarged prostate without lower urinary tract symptoms: Secondary | ICD-10-CM

## 2019-08-26 HISTORY — DX: Benign prostatic hyperplasia without lower urinary tract symptoms: N40.0

## 2019-08-26 NOTE — Assessment & Plan Note (Signed)
Monitor and report any concerning numbers, no changes to meds. Encouraged heart healthy diet such as the DASH diet and exercise as tolerated.  

## 2019-08-26 NOTE — Assessment & Plan Note (Signed)
Tolerating statin, encouraged heart healthy diet, avoid trans fats, minimize simple carbs and saturated fats. Increase exercise as tolerated 

## 2019-08-26 NOTE — Progress Notes (Signed)
Patient ID: Nathan Hess, male   DOB: [birth year], 69 y.o.   MRN: [id number] Virtual Visit via Video Note  I connected with Mickle Asper on 08/26/19 at  2:20 PM EST by a video enabled telemedicine application and verified that I am speaking with the correct person using two identifiers.  Location: Patient: home Provider: office   I discussed the limitations of evaluation and management by telemedicine and the availability of in person appointments. The patient expressed understanding and agreed to proceed. Crissie Sickles, CMA was able to get the patient set up on visit, video    Subjective:    Patient ID: Nathan Hess, male    DOB: 31-Dec-1950, 69 y.o.   MRN: [id number]  No chief complaint on file.   HPI Patient is in today for follow up on chronic medical concerns and for discussion regarding increased urinary frequency. He has taken Tamulosin in the past for BPH with good results and he is interested in possibly restarting. No recent flares in his sugars. They are hovering around 100. No polydipsia. No recent febrile illness or hospitalizations. Denies CP/palp/SOB/HA/congestion/fevers/GI c/o. Taking meds as prescribed  Past Medical History:  Diagnosis Date  . Depression   . Diabetes mellitus type 2 in obese (HCC) 05/25/2012  . Enlarged prostate   . Hearing loss in left ear 01/08/2017  . History of colonic polyps 06/26/2015   3rd colonoscopy in 2016 with Dr Grayling Congress in [place]  . Hyperlipidemia 05/25/2012  . Left knee DJD   . Migraine headache 08/03/2012  . Other malaise and fatigue 11/01/2012  . Preventative health care 09/22/2014  . Salivary gland stone 02/28/2016  . Tobacco use disorder 10/30/2012  . Unspecified essential hypertension 05/25/2012   Does not see a cardiologist    Past Surgical History:  Procedure Laterality Date  . left hand surgery    . PARTIAL KNEE ARTHROPLASTY Left 04/06/2013   Procedure: UNICOMPARTMENTAL LEFT KNEE;  Surgeon: Nilda Simmer, MD;  Location: [facility] OR;  Service: Orthopedics;  Laterality: Left;  . UMBILICAL HERNIA REPAIR  2017   w/ small ventral ex mesh    Family History  Problem Relation Age of Onset  . Alzheimer's disease Mother   . Hypertension Father   . Diabetes Brother        ?  Marland Kitchen Alcohol abuse Maternal Grandfather   . Cancer Paternal Grandmother   . Heart disease Paternal Grandfather        MI    Social History   Socioeconomic History  . Marital status: Married    Spouse name: Not on file  . Number of children: Not on file  . Years of education: Not on file  . Highest education level: Not on file  Occupational History  . Not on file  Tobacco Use  . Smoking status: Former Smoker    Packs/day: 1.00    Years: 15.00    Pack years: 15.00    Types: Cigarettes    Quit date: 02/2019    Years since quitting: 0.5  . Smokeless tobacco: Never Used  Substance and Sexual Activity  . Alcohol use: Yes    Comment: 1 beer about every 2 months   . Drug use: No  . Sexual activity: Yes    Birth control/protection: Condom  Other Topics Concern  . Not on file  Social History Narrative  . Not on file   Social Determinants of Health   Financial Resource Strain:   . Difficulty of Paying Living  Expenses: Not on file  Food Insecurity:   . Worried About Programme researcher, broadcasting/film/video in the Last Year: Not on file  . Ran Out of Food in the Last Year: Not on file  Transportation Needs:   . Lack of Transportation (Medical): Not on file  . Lack of Transportation (Non-Medical): Not on file  Physical Activity:   . Days of Exercise per Week: Not on file  . Minutes of Exercise per Session: Not on file  Stress:   . Feeling of Stress : Not on file  Social Connections:   . Frequency of Communication with Friends and Family: Not on file  . Frequency of Social Gatherings with Friends and Family: Not on file  . Attends Religious Services: Not on file  . Active Member of Clubs or Organizations: Not on file  . Attends  Banker Meetings: Not on file  . Marital Status: Not on file  Intimate Partner Violence:   . Fear of Current or Ex-Partner: Not on file  . Emotionally Abused: Not on file  . Physically Abused: Not on file  . Sexually Abused: Not on file    Outpatient Medications Prior to Visit  Medication Sig Dispense Refill  . aspirin EC 81 MG tablet Take 1 tablet (81 mg total) by mouth daily. 90 tablet 3  . rosuvastatin (CRESTOR) 20 MG tablet Take 1 tablet (20 mg total) by mouth daily. 90 tablet 3  . tadalafil (ADCIRCA/CIALIS) 20 MG tablet Take 1 tablet (20 mg total) by mouth daily as needed for erectile dysfunction. 9 tablet 5  . telmisartan-hydrochlorothiazide (MICARDIS HCT) 40-12.5 MG tablet TAKE 1 TABLET BY MOUTH EVERY DAY 90 tablet 1  . triamcinolone cream (KENALOG) 0.1 % Apply 1 application topically 2 (two) times daily. 80 g 1   No facility-administered medications prior to visit.    Allergies  Allergen Reactions  . Chantix [Varenicline] Other (See Comments)    depression    Review of Systems  Constitutional: Negative for fever and malaise/fatigue.  HENT: Negative for congestion.   Eyes: Negative for blurred vision.  Respiratory: Negative for shortness of breath.   Cardiovascular: Negative for chest pain, palpitations and leg swelling.  Gastrointestinal: Negative for abdominal pain, blood in stool and nausea.  Genitourinary: Positive for frequency and urgency. Negative for dysuria.  Musculoskeletal: Negative for falls.  Skin: Negative for rash.  Neurological: Negative for dizziness, loss of consciousness and headaches.  Endo/Heme/Allergies: Negative for environmental allergies.  Psychiatric/Behavioral: Negative for depression. The patient is not nervous/anxious.        Objective:    Physical Exam Constitutional:      Appearance: Normal appearance. He is not ill-appearing.  HENT:     Head: Normocephalic and atraumatic.     Nose: Nose normal.  Eyes:     General:         Right eye: No discharge.        Left eye: No discharge.  Pulmonary:     Effort: Pulmonary effort is normal.  Neurological:     Mental Status: He is alert and oriented to person, place, and time.  Psychiatric:        Behavior: Behavior normal.     BP 122/75 (BP Location: Left Arm, Patient Position: Sitting, Cuff Size: Normal)   Wt 218 lb (98.9 kg)   BMI 30.40 kg/m  Wt Readings from Last 3 Encounters:  08/25/19 218 lb (98.9 kg)  05/25/19 226 lb 9.6 oz (102.8 kg)  04/20/19 232 lb (  105.2 kg)    Diabetic Foot Exam - Simple   No data filed     Lab Results  Component Value Date   WBC 7.2 04/15/2019   HGB 13.9 04/15/2019   HCT 40.0 04/15/2019   PLT 202.0 04/15/2019   GLUCOSE 196 (H) 04/15/2019   CHOL 120 04/15/2019   TRIG 170.0 (H) 04/15/2019   HDL 40.30 04/15/2019   LDLCALC 45 04/15/2019   ALT 31 04/15/2019   AST 21 04/15/2019   NA 137 04/15/2019   K 4.3 04/15/2019   CL 102 04/15/2019   CREATININE 0.81 04/15/2019   BUN 21 04/15/2019   CO2 27 04/15/2019   TSH 1.66 04/15/2019   PSA 3.78 04/15/2019   INR 1.00 03/30/2013   HGBA1C 7.5 (H) 04/15/2019   MICROALBUR 0.6 01/08/2017    Lab Results  Component Value Date   TSH 1.66 04/15/2019   Lab Results  Component Value Date   WBC 7.2 04/15/2019   HGB 13.9 04/15/2019   HCT 40.0 04/15/2019   MCV 95.5 04/15/2019   PLT 202.0 04/15/2019   Lab Results  Component Value Date   NA 137 04/15/2019   K 4.3 04/15/2019   CO2 27 04/15/2019   GLUCOSE 196 (H) 04/15/2019   BUN 21 04/15/2019   CREATININE 0.81 04/15/2019   BILITOT 0.3 04/15/2019   ALKPHOS 57 04/15/2019   AST 21 04/15/2019   ALT 31 04/15/2019   PROT 6.7 04/15/2019   ALBUMIN 4.3 04/15/2019   CALCIUM 9.0 04/15/2019   GFR 94.60 04/15/2019   Lab Results  Component Value Date   CHOL 120 04/15/2019   Lab Results  Component Value Date   HDL 40.30 04/15/2019   Lab Results  Component Value Date   LDLCALC 45 04/15/2019   Lab Results  Component  Value Date   TRIG 170.0 (H) 04/15/2019   Lab Results  Component Value Date   CHOLHDL 3 04/15/2019   Lab Results  Component Value Date   HGBA1C 7.5 (H) 04/15/2019       Assessment & Plan:   Problem List Items Addressed This Visit    Diabetes mellitus type 2 in obese (HCC)    hgba1c acceptable, minimize simple carbs. Increase exercise as tolerated. Continue current meds. He was not taking the metformin but agrees to keep taking it now.       Relevant Medications   metFORMIN (GLUCOPHAGE) 500 MG tablet   Hyperlipidemia    Tolerating statin, encouraged heart healthy diet, avoid trans fats, minimize simple carbs and saturated fats. Increase exercise as tolerated      Relevant Medications   metFORMIN (GLUCOPHAGE) 500 MG tablet   Essential hypertension    Monitor and report any concerning numbers, no changes to meds. Encouraged heart healthy diet such as the DASH diet and exercise as tolerated.       Relevant Medications   metFORMIN (GLUCOPHAGE) 500 MG tablet   Tobacco use disorder   Relevant Medications   metFORMIN (GLUCOPHAGE) 500 MG tablet   Depression   Relevant Medications   metFORMIN (GLUCOPHAGE) 500 MG tablet   BPH (benign prostatic hyperplasia)    He has taken Tamulosin previously with good results will restart today and reassess.       Relevant Medications   tamsulosin (FLOMAX) 0.4 MG CAPS capsule    Other Visit Diagnoses    Low back pain without sciatica       Relevant Medications   metFORMIN (GLUCOPHAGE) 500 MG tablet  I am having Aland Chestnutt. Zwick start on tamsulosin. I am also having him maintain his rosuvastatin, tadalafil, triamcinolone cream, aspirin EC, telmisartan-hydrochlorothiazide, and metFORMIN.  Meds ordered this encounter  Medications  . metFORMIN (GLUCOPHAGE) 500 MG tablet    Sig: Take 1 tablet (500 mg total) by mouth daily with breakfast.    Dispense:  90 tablet    Refill:  1    DISCONTINUE ALL PREVIOUS REFILLS FOR THIS  MEDICATION  . tamsulosin (FLOMAX) 0.4 MG CAPS capsule    Sig: Take 1 capsule (0.4 mg total) by mouth daily.    Dispense:  90 capsule    Refill:  1     I discussed the assessment and treatment plan with the patient. The patient was provided an opportunity to ask questions and all were answered. The patient agreed with the plan and demonstrated an understanding of the instructions.   The patient was advised to call back or seek an in-person evaluation if the symptoms worsen or if the condition fails to improve as anticipated.  I provided 25 minutes of non-face-to-face time during this encounter.   Penni Homans, MD

## 2019-08-26 NOTE — Assessment & Plan Note (Signed)
He has taken Tamulosin previously with good results will restart today and reassess.

## 2019-08-26 NOTE — Assessment & Plan Note (Signed)
hgba1c acceptable, minimize simple carbs. Increase exercise as tolerated. Continue current meds. He was not taking the metformin but agrees to keep taking it now.

## 2019-11-13 ENCOUNTER — Ambulatory Visit: Payer: BC Managed Care – PPO | Admitting: Family Medicine

## 2019-11-13 ENCOUNTER — Encounter: Payer: Self-pay | Admitting: Family Medicine

## 2019-11-13 ENCOUNTER — Other Ambulatory Visit: Payer: Self-pay

## 2019-11-13 ENCOUNTER — Ambulatory Visit (INDEPENDENT_AMBULATORY_CARE_PROVIDER_SITE_OTHER)
Admission: RE | Admit: 2019-11-13 | Discharge: 2019-11-13 | Disposition: A | Discharge status: Home / Self Care | Payer: BC Managed Care – PPO | Source: Ambulatory Visit | Attending: Family Medicine | Admitting: Family Medicine

## 2019-11-13 VITALS — BP 140/72 | HR 78 | Temp 96.3°F | Ht 71.0 in | Wt 225.0 lb

## 2019-11-13 DIAGNOSIS — M545 Low back pain, unspecified: Secondary | ICD-10-CM

## 2019-11-13 DIAGNOSIS — G8929 Other chronic pain: Secondary | ICD-10-CM

## 2019-11-13 DIAGNOSIS — R109 Unspecified abdominal pain: Secondary | ICD-10-CM | POA: Diagnosis not present

## 2019-11-13 NOTE — Progress Notes (Signed)
Musculoskeletal Exam  Patient: Nathan Hess DOB: 09-25-1950  DOS: 11/13/2019  SUBJECTIVE:   Chief Complaint:   Chief Complaint  Patient presents with  . Back Pain    right side    Nathan Hess is a 69 y.o.  male for evaluation and treatment of his R sided low back pain.   Onset:  2 months ago.  No inj or change in activity.  Location: lower R Character:  spasms  Progression of issue:  has worsened Associated symptoms: No bruising, redness, or swelling Denies bowel/bladder incontinence or weakness Treatment: to date has been none.   Neurovascular symptoms: no His wife's first husband passed from similar s/s's due to CA mets.  He does not have a hx of kidney stones or infection No urinary complaints or rashes.   Past Medical History:  Diagnosis Date  . Depression   . Diabetes mellitus type 2 in obese (HCC) 05/25/2012  . Enlarged prostate   . Hearing loss in left ear 01/08/2017  . History of colonic polyps 06/26/2015   3rd colonoscopy in 2016 with Dr Grayling Congress in [place]  . Hyperlipidemia 05/25/2012  . Left knee DJD   . Migraine headache 08/03/2012  . Other malaise and fatigue 11/01/2012  . Preventative health care 09/22/2014  . Salivary gland stone 02/28/2016  . Tobacco use disorder 10/30/2012  . Unspecified essential hypertension 05/25/2012   Does not see a cardiologist   Objective:  VITAL SIGNS: BP 140/72 (BP Location: Right Arm, Patient Position: Sitting, Cuff Size: Normal)   Pulse 78   Temp (!) 96.3 F (35.7 C) (Temporal)   Ht 5\' 11"  (1.803 m)   Wt 225 lb (102.1 kg)   SpO2 98%   BMI 31.38 kg/m  Constitutional: Well formed, well developed. No acute distress. HENT: Normocephalic, atraumatic.  Thorax & Lungs:  No accessory muscle use Skin: Warm. Dry. No erythema. No rash.  Musculoskeletal: low back.    Tenderness to palpation: no Deformity: no Ecchymosis: no Straight leg test: negative for Poor hamstring flexibility b/l. Neurologic: Normal sensory  function. No focal deficits noted. DTR's equal and symmetric in LE's. No clonus. Psychiatric: Normal mood. Age appropriate judgment and insight. Alert & oriented x 3.    Assessment:  Chronic right-sided low back pain without sciatica - Plan: DG Abd 1 View  Plan: I suspect msk involvement. Will ck XR to r/o stones and lytic lesions. He is going to [place] to avoid facility fee.  Stretches/exercises, heat, ice, Tylenol, NSAIDs. F/u prn. The patient voiced understanding and agreement to the plan.   [place], DO 11/13/19  1:32 PM

## 2019-11-13 NOTE — Patient Instructions (Addendum)

## 2019-11-17 ENCOUNTER — Other Ambulatory Visit: Payer: Self-pay | Admitting: Cardiology

## 2019-11-19 ENCOUNTER — Other Ambulatory Visit: Payer: Self-pay | Admitting: Family Medicine

## 2019-11-19 DIAGNOSIS — E785 Hyperlipidemia, unspecified: Secondary | ICD-10-CM

## 2019-11-19 DIAGNOSIS — F329 Major depressive disorder, single episode, unspecified: Secondary | ICD-10-CM

## 2019-11-19 DIAGNOSIS — F17200 Nicotine dependence, unspecified, uncomplicated: Secondary | ICD-10-CM

## 2019-11-19 DIAGNOSIS — M545 Low back pain, unspecified: Secondary | ICD-10-CM

## 2019-11-19 DIAGNOSIS — I10 Essential (primary) hypertension: Secondary | ICD-10-CM

## 2019-11-19 DIAGNOSIS — F32A Depression, unspecified: Secondary | ICD-10-CM

## 2019-11-19 DIAGNOSIS — E669 Obesity, unspecified: Secondary | ICD-10-CM

## 2019-11-19 DIAGNOSIS — E1169 Type 2 diabetes mellitus with other specified complication: Secondary | ICD-10-CM

## 2019-11-20 ENCOUNTER — Ambulatory Visit: Payer: BC Managed Care – PPO | Admitting: Family Medicine

## 2019-11-24 ENCOUNTER — Encounter: Payer: Self-pay | Admitting: Family Medicine

## 2019-11-24 ENCOUNTER — Ambulatory Visit: Payer: BC Managed Care – PPO | Admitting: Family Medicine

## 2019-11-24 ENCOUNTER — Other Ambulatory Visit: Payer: Self-pay

## 2019-11-24 VITALS — BP 120/64 | HR 86 | Temp 95.9°F | Ht 71.0 in | Wt 222.2 lb

## 2019-11-24 DIAGNOSIS — M545 Low back pain, unspecified: Secondary | ICD-10-CM

## 2019-11-24 DIAGNOSIS — K5900 Constipation, unspecified: Secondary | ICD-10-CM | POA: Diagnosis not present

## 2019-11-24 MED ORDER — TRAMADOL HCL 50 MG PO TABS: 50.0000 mg | ORAL_TABLET | Freq: Three times a day (TID) | ORAL | 0 refills | Status: AC | PRN

## 2019-11-24 NOTE — Patient Instructions (Signed)
Heat (pad or rice pillow in microwave) over affected area, 10-15 minutes twice daily.   Stay hydrated.  Try MiraLAX routinely to help with stools over the next couple weeks.   Do not drink alcohol, do any illicit/street drugs, drive or do anything that requires alertness while on the tramadol.    Let us know if you need anything.

## 2019-11-24 NOTE — Progress Notes (Signed)
Musculoskeletal Exam  Patient: Nathan Hess DOB: Jan 24, 1951  DOS: 11/24/2019  SUBJECTIVE:  Chief Complaint:   Chief Complaint  Patient presents with  . Back Pain    Nathan Hess is a 69 y.o.  male for evaluation and treatment of his back pain.   Onset: 12 d ago was here, got worse and then better, worse  Location: lower Character:  spasms  Progression of issue: Waxes and wanes Associated symptoms: Some radiation of pain down his right leg Denies bowel/bladder incontinence or weakness Treatment: to date has been OTC NSAIDS and tramadol; he was noncompliant with the stretches and exercises. Neurovascular symptoms: no He had an x-ray that showed no stones or lytic bone lesions.  He has no history of gout.  Past Medical History:  Diagnosis Date  . Depression   . Diabetes mellitus type 2 in obese (HCC) 05/25/2012  . Enlarged prostate   . Hearing loss in left ear 01/08/2017  . History of colonic polyps 06/26/2015   3rd colonoscopy in 2016 with Dr Grayling Congress in [place]  . Hyperlipidemia 05/25/2012  . Left knee DJD   . Migraine headache 08/03/2012  . Other malaise and fatigue 11/01/2012  . Preventative health care 09/22/2014  . Salivary gland stone 02/28/2016  . Tobacco use disorder 10/30/2012  . Unspecified essential hypertension 05/25/2012   Does not see a cardiologist    Objective:  VITAL SIGNS: BP 120/64 (BP Location: Left Arm, Patient Position: Sitting, Cuff Size: Normal)   Pulse 86   Temp (!) 95.9 F (35.5 C) (Temporal)   Ht 5\' 11"  (1.803 m)   Wt 222 lb 4 oz (100.8 kg)   SpO2 96%   BMI 31.00 kg/m  Constitutional: Well formed, well developed. No acute distress. HENT: Normocephalic, atraumatic.  Thorax & Lungs:  No accessory muscle use Skin: Warm. Dry. No erythema. No rash.  Musculoskeletal: low back.   Tenderness to palpation: no Deformity: no Ecchymosis: no Straight leg test: negative for Poor hamstring flexibility b/l. Neurologic: Normal sensory  function. No focal deficits noted. DTR's equal and symmetric in LE's. No clonus. Psychiatric: Normal mood. Age appropriate judgment and insight. Alert & oriented x 3.    Assessment:  Right-sided low back pain without sciatica, unspecified chronicity - Plan: traMADol (ULTRAM) 50 MG tablet  Constipation, unspecified constipation type  Plan: Tramadol for breakthrough pain.  He has been on this before and tolerated well. Stretches/exercises should be done this time, heat, ice, Tylenol.  He has an appointment with the physical therapy team in 2 weeks.  He did have a stool burden noted on x-ray and notices he is constipated from time to time.  While this is likely not the cause of his presenting problem, I did encourage him to stay hydrated and consider MiraLAX daily over the next few weeks. F/u with me prn. The patient voiced understanding and agreement to the plan.   [place], DO 11/24/19  4:27 PM

## 2019-12-10 DIAGNOSIS — M545 Low back pain: Secondary | ICD-10-CM | POA: Diagnosis not present

## 2019-12-17 ENCOUNTER — Other Ambulatory Visit: Payer: Self-pay | Admitting: Cardiology

## 2019-12-22 DIAGNOSIS — M545 Low back pain: Secondary | ICD-10-CM | POA: Diagnosis not present

## 2020-01-04 ENCOUNTER — Telehealth: Payer: Self-pay

## 2020-01-04 MED ORDER — TELMISARTAN-HCTZ 40-12.5 MG PO TABS: 1.0000 | ORAL_TABLET | Freq: Every day | ORAL | 0 refills | Status: DC

## 2020-01-04 NOTE — Telephone Encounter (Signed)
Refill sent for Telemisartan HCTZ 40-12.5 to [facility]

## 2020-02-10 DIAGNOSIS — K621 Rectal polyp: Secondary | ICD-10-CM | POA: Diagnosis not present

## 2020-02-10 DIAGNOSIS — D128 Benign neoplasm of rectum: Secondary | ICD-10-CM | POA: Diagnosis not present

## 2020-02-10 DIAGNOSIS — K635 Polyp of colon: Secondary | ICD-10-CM | POA: Diagnosis not present

## 2020-02-10 DIAGNOSIS — Z86010 Personal history of colonic polyps: Secondary | ICD-10-CM | POA: Diagnosis not present

## 2020-02-10 DIAGNOSIS — D122 Benign neoplasm of ascending colon: Secondary | ICD-10-CM | POA: Diagnosis not present

## 2020-02-10 DIAGNOSIS — Z1211 Encounter for screening for malignant neoplasm of colon: Secondary | ICD-10-CM | POA: Diagnosis not present

## 2020-02-10 HISTORY — DX: Polyp of colon: K63.5

## 2020-02-10 LAB — HM COLONOSCOPY

## 2020-02-17 ENCOUNTER — Other Ambulatory Visit: Payer: Self-pay | Admitting: Family Medicine

## 2020-02-17 DIAGNOSIS — E669 Obesity, unspecified: Secondary | ICD-10-CM

## 2020-02-17 DIAGNOSIS — E1169 Type 2 diabetes mellitus with other specified complication: Secondary | ICD-10-CM

## 2020-02-17 DIAGNOSIS — I10 Essential (primary) hypertension: Secondary | ICD-10-CM

## 2020-02-17 DIAGNOSIS — E785 Hyperlipidemia, unspecified: Secondary | ICD-10-CM

## 2020-02-17 DIAGNOSIS — F17200 Nicotine dependence, unspecified, uncomplicated: Secondary | ICD-10-CM

## 2020-02-17 DIAGNOSIS — M545 Low back pain, unspecified: Secondary | ICD-10-CM

## 2020-02-17 DIAGNOSIS — F32A Depression, unspecified: Secondary | ICD-10-CM

## 2020-03-25 DIAGNOSIS — Z23 Encounter for immunization: Secondary | ICD-10-CM | POA: Diagnosis not present

## 2020-03-30 ENCOUNTER — Inpatient Hospital Stay (HOSPITAL_BASED_OUTPATIENT_CLINIC_OR_DEPARTMENT_OTHER): Admission: RE | Admit: 2020-03-30 | Payer: BC Managed Care – PPO | Source: Ambulatory Visit

## 2020-04-20 ENCOUNTER — Ambulatory Visit: Payer: BC Managed Care – PPO | Admitting: Cardiology

## 2020-04-20 ENCOUNTER — Other Ambulatory Visit: Payer: Self-pay

## 2020-04-20 ENCOUNTER — Ambulatory Visit (HOSPITAL_BASED_OUTPATIENT_CLINIC_OR_DEPARTMENT_OTHER)
Admission: RE | Admit: 2020-04-20 | Discharge: 2020-04-20 | Disposition: A | Discharge status: Home / Self Care | Payer: BC Managed Care – PPO | Source: Ambulatory Visit | Attending: Cardiology | Admitting: Cardiology

## 2020-04-20 DIAGNOSIS — I714 Abdominal aortic aneurysm, without rupture, unspecified: Secondary | ICD-10-CM

## 2020-04-21 ENCOUNTER — Telehealth: Payer: Self-pay

## 2020-04-21 NOTE — Telephone Encounter (Signed)
-----   Message from Baldo Daub, MD sent at 04/21/2020  8:37 AM EDT ----- I reviewed his previous there has been no change this is a good finding I think we can wait and recheck in 1 year we can put him in recall and we can do it at [facility]

## 2020-04-21 NOTE — Telephone Encounter (Signed)
Left message on patients voicemail to please return our call.   

## 2020-04-22 ENCOUNTER — Telehealth: Payer: Self-pay

## 2020-04-22 NOTE — Telephone Encounter (Signed)
-----   Message from Baldo Daub, MD sent at 04/21/2020  8:37 AM EDT ----- I reviewed his previous there has been no change this is a good finding I think we can wait and recheck in 1 year we can put him in recall and we can do it at [facility]

## 2020-04-22 NOTE — Telephone Encounter (Signed)
Spoke with patient regarding results and recommendation.  Patient verbalizes understanding and is agreeable to plan of care. Advised patient to call back with any issues or concerns.  

## 2020-05-12 DIAGNOSIS — N400 Enlarged prostate without lower urinary tract symptoms: Secondary | ICD-10-CM | POA: Insufficient documentation

## 2020-05-12 NOTE — Progress Notes (Signed)
Cardiology Office Note:    Date:  05/13/2020   ID:  Nathan Hess, DOB Jan 10, 1951, MRN [id number]  PCP:  Bradd Canary, MD  Cardiologist:  Norman Herrlich, MD    Referring MD: Bradd Canary, MD    ASSESSMENT:    1. Abdominal aortic aneurysm (AAA) without rupture (HCC)   2. Essential hypertension   3. Mixed hyperlipidemia   4. Diabetes mellitus type 2 in obese [facility])    PLAN:    In order of problems listed above:  1. Stable no progression reinforced the need for good control of hypertension diabetes and dyslipidemia to slow progression recheck duplex 1 year follow-up in the office after have rapid progression are greater than 5 cm refer to vascular surgery for EVAR stable well-controlled continue ARB thiazide diuretic and he has lab work upcoming in the next few weeks to look at renal function potassium 2. Continue his statin and needs a lipid profile has office follow-up 3. Managed by his PCP he is on low-dose Metformin.  If a second agent is needed SGLT2 inhibitor or GLP-1 agents are preferable from a cardiovascular perspective   Next appointment: 1 year   Medication Adjustments/Labs and Tests Ordered: Current medicines are reviewed at length with the patient today.  Concerns regarding medicines are outlined above.  Orders Placed This Encounter  Procedures  . EKG 12-Lead   No orders of the defined types were placed in this encounter.   Chief Complaint  Patient presents with  . Follow-up    History of Present Illness:    Nathan Hess is a 69 y.o. male with a hx of small abdominal aortic aneurysm duplex September 2020 maximum diameter 3.2 cm stable, hypertension hyperlipidemia and type 2 diabetes last seen 04/20/2019.  Follow-up duplex abdominal aorta 04/20/2020 stable 33 mm. Compliance with diet, lifestyle and medications: Yes  He is reassured by the findings of his recent duplex we discussed the natural history abdominal aortic aneurysm the impact of  age hypertension hyperlipidemia he is compliant with his medications has had no abdominal pain chest pain palpitation or syncope.  He is due for wellness exam and will have labs performed at that time.  His most recent lipid profile 03/26/2019 shows cholesterol 120 LDL 45 triglyceride 170 HDL 40 A1c above target 7.5% Past Medical History:  Diagnosis Date  . AAA (abdominal aortic aneurysm) (HCC) 10/06/2018  . Anxiety 09/24/2011  . Atopic dermatitis 11/26/2016  . BPH (benign prostatic hyperplasia) 08/26/2019  . Constipation 05/25/2019  . Costochondritis 04/03/2018  . Depression   . Diabetes mellitus type 2 in obese (HCC) 05/25/2012  . Enlarged prostate   . Erectile dysfunction 06/13/2010   Formatting of this note might be different from the original. Cialis 20 mg daily prn    Last Assessment & Plan:  Formatting of this note might be different from the original. Patient has good results with Cialis 20 mg as needed, but has run out. In the past, he tried Viagra with inadequate results. He was given a refill for #9 with 11 refills.  . Gallstone 10/06/2018  . Headache 04/10/2010   Formatting of this note might be different from the original. Sumatriptan 100 mg prn and hydrocodone/APAP 7.5/500 mg prn  ICD-10 cut over   Last Assessment & Plan:  Formatting of this note might be different from the original. Patient is having a migraine today and was given a sample of relpax 40 mg today because he isn't carrying the sumatriptan with him.  He says that he is getting very few migra  . Hearing loss in left ear 01/08/2017  . History of colonic polyps 06/26/2015   3rd colonoscopy in 2016 with Dr Grayling Congress in [place]  . Hypercholesterolemia 04/11/2007   Formatting of this note might be different from the original. Crestor 20 mg daily   Last Assessment & Plan:  Formatting of this note might be different from the original. Patient is taking Crestor 20 mg but has some compliance issues, missing it 2-3 times weekly. He says that he  misses it because he can't find it and I have suggested that he use a med-minder. His lipids are at goal and I expect hi  . Hyperlipidemia 05/25/2012  . Hypogonadism male 09/24/2011  . Left knee DJD   . Lumbago 04/25/2014  . Migraine headache 08/03/2012  . Need for prophylactic vaccination and inoculation against influenza 05/13/2013  . Obesity 04/11/2007   Last Assessment & Plan:  Formatting of this note might be different from the original. Patient has gained about 22 pounds since he quit smoking 6 months ago. He is craving food more and says that he can't "fight two devils" at once. I have made a few suggestions about how he could reduce it.  . Other malaise and fatigue 11/01/2012  . Prediabetes 11/26/2016  . Preventative health care 09/22/2014  . Prostatic hypertrophy 04/10/2010   Formatting of this note might be different from the original. Finasteride 5 mg daily  Last Assessment & Plan:  Formatting of this note might be different from the original. Patient isn't having significant symptoms on the finasteride 5 mg daily.  . 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 Patient is having no pulmonary symptoms since he has quit smoking.  . Reduced libido 08/20/2011  . Salivary gland stone 02/28/2016  . Seborrheic keratoses 04/03/2018  . Tobacco use disorder 10/30/2012  . Type 2 diabetes mellitus (HCC) 04/10/2010   Last Assessment & Plan:  Formatting of this note might be different from the original. Patient has had IFG for years and hasn't been able to take my suggestions regarding weight loss and healthy eating. Blood work done last month shows a glucose that is now 129 and A1c done today, POCT was 6.6%. I have told the patient that given his 22 pound weight gain since smoking cessation, this isn't surpris  . Unspecified essential hypertension 05/25/2012   Does not see a cardiologist    Past Surgical History:    Procedure Laterality Date  . left hand surgery    . PARTIAL KNEE ARTHROPLASTY Left 04/06/2013   Procedure: UNICOMPARTMENTAL LEFT KNEE;  Surgeon: Nilda Simmer, MD;  Location: [facility] OR;  Service: Orthopedics;  Laterality: Left;  . UMBILICAL HERNIA REPAIR  2017   w/ small ventral ex mesh    Current Medications: Current Meds  Medication Sig  . aspirin EC 81 MG tablet Take 1 tablet (81 mg total) by mouth daily.  . metFORMIN (GLUCOPHAGE) 500 MG tablet Take 1 tablet (500 mg total) by mouth daily with breakfast.  . rosuvastatin (CRESTOR) 20 MG tablet TAKE 1 TABLET BY MOUTH EVERY DAY  . tamsulosin (FLOMAX) 0.4 MG CAPS capsule Take 1 capsule (0.4 mg total) by mouth daily.  Marland Kitchen telmisartan-hydrochlorothiazide (MICARDIS HCT) 40-12.5 MG tablet Take 1 tablet by mouth daily. PATIENT NEED TO CONTACT OFFICE FOR FURTHER REFILLS. APPOINTMENT NEEDED  . triamcinolone cream (KENALOG) 0.1 %  Apply 1 application topically as needed.     Allergies:   Patient has no known allergies.   Social History   Socioeconomic History  . Marital status: Married    Spouse name: Not on file  . Number of children: Not on file  . Years of education: Not on file  . Highest education level: Not on file  Occupational History  . Not on file  Tobacco Use  . Smoking status: Former Smoker    Packs/day: 1.00    Years: 15.00    Pack years: 15.00    Types: Cigarettes    Quit date: 02/2019    Years since quitting: 1.2  . Smokeless tobacco: Never Used  Vaping Use  . Vaping Use: Every day  Substance and Sexual Activity  . Alcohol use: Yes    Comment: 1 beer about every 2 months   . Drug use: No  . Sexual activity: Yes    Birth control/protection: Condom  Other Topics Concern  . Not on file  Social History Narrative  . Not on file   Social Determinants of Health   Financial Resource Strain:   . Difficulty of Paying Living Expenses: Not on file  Food Insecurity:   . Worried About Programme researcher, broadcasting/film/video in the Last Year:  Not on file  . Ran Out of Food in the Last Year: Not on file  Transportation Needs:   . Lack of Transportation (Medical): Not on file  . Lack of Transportation (Non-Medical): Not on file  Physical Activity:   . Days of Exercise per Week: Not on file  . Minutes of Exercise per Session: Not on file  Stress:   . Feeling of Stress : Not on file  Social Connections:   . Frequency of Communication with Friends and Family: Not on file  . Frequency of Social Gatherings with Friends and Family: Not on file  . Attends Religious Services: Not on file  . Active Member of Clubs or Organizations: Not on file  . Attends Banker Meetings: Not on file  . Marital Status: Not on file     Family History: The patient's family history includes Alcohol abuse in his maternal grandfather; Alzheimer's disease in his mother; Cancer in his paternal grandmother; Diabetes in his brother; Heart disease in his paternal grandfather; Hypertension in his father. ROS:   Please see the history of present illness.    All other systems reviewed and are negative.  EKGs/Labs/Other Studies Reviewed:    The following studies were reviewed today:  EKG:  EKG ordered today and personally reviewed.  The ekg ordered today demonstrates sinus rhythm normal  Recent Labs: No results found for requested labs within last 8760 hours.  Recent Lipid Panel    Component Value Date/Time   CHOL 120 04/15/2019 0743   TRIG 170.0 (H) 04/15/2019 0743   HDL 40.30 04/15/2019 0743   CHOLHDL 3 04/15/2019 0743   VLDL 34.0 04/15/2019 0743   LDLCALC 45 04/15/2019 0743    Physical Exam:    VS:  BP 129/81   Pulse 81   Ht 5\' 11"  (1.803 m)   Wt 221 lb 1.9 oz (100.3 kg)   SpO2 96%   BMI 30.84 kg/m     Wt Readings from Last 3 Encounters:  05/13/20 221 lb 1.9 oz (100.3 kg)  11/24/19 222 lb 4 oz (100.8 kg)  11/13/19 225 lb (102.1 kg)     GEN:  Well nourished, well developed in no acute  distress HEENT: Normal NECK: No  JVD; No carotid bruits LYMPHATICS: No lymphadenopathy CARDIAC: RRR, no murmurs, rubs, gallops RESPIRATORY:  Clear to auscultation without rales, wheezing or rhonchi  ABDOMEN: Soft, non-tender, non-distended MUSCULOSKELETAL:  No edema; No deformity  SKIN: Warm and dry NEUROLOGIC:  Alert and oriented x 3 PSYCHIATRIC:  Normal affect    Signed, Norman HerrlichBrian Montrelle Eddings, MD  05/13/2020 8:41 AM    [facility]

## 2020-05-13 ENCOUNTER — Ambulatory Visit: Payer: BC Managed Care – PPO | Admitting: Cardiology

## 2020-05-13 ENCOUNTER — Encounter: Payer: Self-pay | Admitting: Cardiology

## 2020-05-13 ENCOUNTER — Other Ambulatory Visit: Payer: Self-pay

## 2020-05-13 VITALS — BP 129/81 | HR 81 | Ht 71.0 in | Wt 221.1 lb

## 2020-05-13 DIAGNOSIS — I10 Essential (primary) hypertension: Secondary | ICD-10-CM | POA: Diagnosis not present

## 2020-05-13 DIAGNOSIS — E1169 Type 2 diabetes mellitus with other specified complication: Secondary | ICD-10-CM

## 2020-05-13 DIAGNOSIS — E669 Obesity, unspecified: Secondary | ICD-10-CM

## 2020-05-13 DIAGNOSIS — E782 Mixed hyperlipidemia: Secondary | ICD-10-CM

## 2020-05-13 DIAGNOSIS — I714 Abdominal aortic aneurysm, without rupture, unspecified: Secondary | ICD-10-CM

## 2020-05-13 NOTE — Patient Instructions (Addendum)
Medication Instructions:  Your physician recommends that you continue on your current medications as directed. Please refer to the Current Medication list given to you today.  *If you need a refill on your cardiac medications before your next appointment, please call your pharmacy*   Lab Work: None If you have labs (blood work) drawn today and your tests are completely normal, you will receive your results only by: Marland Kitchen MyChart Message (if you have MyChart) OR . A paper copy in the mail If you have any lab test that is abnormal or we need to change your treatment, we will call you to review the results.   Testing/Procedures: Please call us in September of next year to schedule your AAA duplex.    Follow-Up: At [facility], you and your health needs are our priority.  As part of our continuing mission to provide you with exceptional heart care, we have created designated Provider Care Teams.  These Care Teams include your primary Cardiologist (physician) and Advanced Practice Providers (APPs -  Physician Assistants and Nurse Practitioners) who all work together to provide you with the care you need, when you need it.  We recommend signing up for the patient portal called "MyChart".  Sign up information is provided on this After Visit Summary.  MyChart is used to connect with patients for Virtual Visits (Telemedicine).  Patients are able to view lab/test results, encounter notes, upcoming appointments, etc.  Non-urgent messages can be sent to your provider as well.   To learn more about what you can do with MyChart, go to [email or web address].    Your next appointment:   1 year(s)  The format for your next appointment:   In Person  Provider:   Norman Herrlich, MD   Other Instructions

## 2020-05-16 ENCOUNTER — Other Ambulatory Visit: Payer: Self-pay | Admitting: Family Medicine

## 2020-05-16 DIAGNOSIS — E785 Hyperlipidemia, unspecified: Secondary | ICD-10-CM

## 2020-05-16 DIAGNOSIS — F32A Depression, unspecified: Secondary | ICD-10-CM

## 2020-05-16 DIAGNOSIS — M545 Low back pain, unspecified: Secondary | ICD-10-CM

## 2020-05-16 DIAGNOSIS — E669 Obesity, unspecified: Secondary | ICD-10-CM

## 2020-05-16 DIAGNOSIS — F17200 Nicotine dependence, unspecified, uncomplicated: Secondary | ICD-10-CM

## 2020-05-16 DIAGNOSIS — E1169 Type 2 diabetes mellitus with other specified complication: Secondary | ICD-10-CM

## 2020-05-16 DIAGNOSIS — I10 Essential (primary) hypertension: Secondary | ICD-10-CM

## 2020-06-28 DIAGNOSIS — Z20822 Contact with and (suspected) exposure to covid-19: Secondary | ICD-10-CM | POA: Diagnosis not present

## 2020-06-28 DIAGNOSIS — Z03818 Encounter for observation for suspected exposure to other biological agents ruled out: Secondary | ICD-10-CM | POA: Diagnosis not present

## 2020-09-27 ENCOUNTER — Other Ambulatory Visit: Payer: Self-pay | Admitting: Family Medicine

## 2020-09-27 ENCOUNTER — Other Ambulatory Visit: Payer: Self-pay

## 2020-09-27 ENCOUNTER — Encounter: Payer: Self-pay | Admitting: Family Medicine

## 2020-09-27 ENCOUNTER — Ambulatory Visit (INDEPENDENT_AMBULATORY_CARE_PROVIDER_SITE_OTHER): Payer: BC Managed Care – PPO | Admitting: Family Medicine

## 2020-09-27 VITALS — BP 112/64 | HR 75 | Temp 98.1°F | Resp 16 | Ht 71.0 in | Wt 226.6 lb

## 2020-09-27 DIAGNOSIS — E1165 Type 2 diabetes mellitus with hyperglycemia: Secondary | ICD-10-CM

## 2020-09-27 DIAGNOSIS — G4700 Insomnia, unspecified: Secondary | ICD-10-CM | POA: Diagnosis not present

## 2020-09-27 DIAGNOSIS — G479 Sleep disorder, unspecified: Secondary | ICD-10-CM

## 2020-09-27 DIAGNOSIS — R5383 Other fatigue: Secondary | ICD-10-CM

## 2020-09-27 DIAGNOSIS — Z125 Encounter for screening for malignant neoplasm of prostate: Secondary | ICD-10-CM

## 2020-09-27 DIAGNOSIS — E785 Hyperlipidemia, unspecified: Secondary | ICD-10-CM

## 2020-09-27 DIAGNOSIS — L209 Atopic dermatitis, unspecified: Secondary | ICD-10-CM

## 2020-09-27 DIAGNOSIS — I10 Essential (primary) hypertension: Secondary | ICD-10-CM

## 2020-09-27 DIAGNOSIS — E7800 Pure hypercholesterolemia, unspecified: Secondary | ICD-10-CM

## 2020-09-27 DIAGNOSIS — Z0000 Encounter for general adult medical examination without abnormal findings: Secondary | ICD-10-CM

## 2020-09-27 DIAGNOSIS — N400 Enlarged prostate without lower urinary tract symptoms: Secondary | ICD-10-CM

## 2020-09-27 DIAGNOSIS — R972 Elevated prostate specific antigen [PSA]: Secondary | ICD-10-CM

## 2020-09-27 DIAGNOSIS — L578 Other skin changes due to chronic exposure to nonionizing radiation: Secondary | ICD-10-CM

## 2020-09-27 HISTORY — DX: Insomnia, unspecified: G47.00

## 2020-09-27 LAB — COMPREHENSIVE METABOLIC PANEL
ALT: 27 U/L (ref 0–53)
AST: 17 U/L (ref 0–37)
Albumin: 4.5 g/dL (ref 3.5–5.2)
Alkaline Phosphatase: 67 U/L (ref 39–117)
BUN: 18 mg/dL (ref 6–23)
CO2: 32 mEq/L (ref 19–32)
Calcium: 9.6 mg/dL (ref 8.4–10.5)
Chloride: 102 mEq/L (ref 96–112)
Creatinine, Ser: 0.83 mg/dL (ref 0.40–1.50)
GFR: 88.96 mL/min (ref >60.00)
Glucose, Bld: 114 mg/dL — ABNORMAL HIGH (ref 70–99)
Potassium: 4.6 mEq/L (ref 3.5–5.1)
Sodium: 139 mEq/L (ref 135–145)
Total Bilirubin: 0.6 mg/dL (ref 0.2–1.2)
Total Protein: 7 g/dL (ref 6.0–8.3)

## 2020-09-27 LAB — CBC WITH DIFFERENTIAL/PLATELET
Basophils Absolute: 0.1 10*3/uL (ref 0.0–0.1)
Basophils Relative: 0.9 % (ref 0.0–3.0)
Eosinophils Absolute: 0.2 10*3/uL (ref 0.0–0.7)
Eosinophils Relative: 2.2 % (ref 0.0–5.0)
HCT: 40.4 % (ref 39.0–52.0)
Hemoglobin: 14.3 g/dL (ref 13.0–17.0)
Lymphocytes Relative: 30.1 % (ref 12.0–46.0)
Lymphs Abs: 2.2 10*3/uL (ref 0.7–4.0)
MCHC: 35.4 g/dL (ref 30.0–36.0)
MCV: 92.7 fl (ref 78.0–100.0)
Monocytes Absolute: 0.7 10*3/uL (ref 0.1–1.0)
Monocytes Relative: 9.3 % (ref 3.0–12.0)
Neutro Abs: 4.1 10*3/uL (ref 1.4–7.7)
Neutrophils Relative %: 57.5 % (ref 43.0–77.0)
Platelets: 228 10*3/uL (ref 150.0–400.0)
RBC: 4.36 Mil/uL (ref 4.22–5.81)
RDW: 12.5 % (ref 11.5–15.5)
WBC: 7.2 10*3/uL (ref 4.0–10.5)

## 2020-09-27 LAB — HEMOGLOBIN A1C: Hgb A1c MFr Bld: 6.8 % — ABNORMAL HIGH (ref 4.6–6.5)

## 2020-09-27 LAB — LIPID PANEL
Cholesterol: 130 mg/dL (ref 0–200)
HDL: 41.8 mg/dL (ref >39.00)
LDL Cholesterol: 73 mg/dL (ref 0–99)
NonHDL: 88.24
Total CHOL/HDL Ratio: 3
Triglycerides: 75 mg/dL (ref 0.0–149.0)
VLDL: 15 mg/dL (ref 0.0–40.0)

## 2020-09-27 LAB — TSH: TSH: 1.44 u[IU]/mL (ref 0.35–4.50)

## 2020-09-27 LAB — PSA: PSA: 4.64 ng/mL — ABNORMAL HIGH (ref 0.10–4.00)

## 2020-09-27 MED ORDER — TAMSULOSIN HCL 0.4 MG PO CAPS: 0.4000 mg | ORAL_CAPSULE | Freq: Every day | ORAL | 1 refills | Status: DC

## 2020-09-27 MED ORDER — TRIAMCINOLONE ACETONIDE 0.1 % EX CREA: 1.0000 "application " | TOPICAL_CREAM | CUTANEOUS | 5 refills | Status: AC | PRN

## 2020-09-27 NOTE — Progress Notes (Signed)
Patient ID: Nathan Hess, male    DOB: [birth year]  Age: 70 y.o. MRN: [id number]    Subjective:  Subjective  HPI Nathan AsperStephen C Mccallister presents for comprehensive physical exam. He complains of difficulty sleeping most night, but occasionally has a good night of sleep. No problems falling asleep(5hrs total), but it is difficult for him to stay asleep. He denies having any HA after waking up from sleeping. He states that he sometimes stop breathing during his sleep, which causes him to abruptly wake up. He is using a CPAP machine to tx symptoms at this time. However, he doesn't have it customized to the correcting for him.   He has noticed some skin changes, it is worse on his back and hands. The patient has a PMHx of eczema, and recently ran out of his prescribed topical cream.    He complains of right shoulder pain after sleeping. No radiation down the arm. He denies any trauma or injury. He denies having it checked out this time but states that if the symptoms worsen he would like further evaluation.   He still complains of decrease stream when urinating. In the past he reports that Flomax has helped. He denies any urinary burning or hematuria. He denies any chest pain, SOB, HA, fever, chills, cough, abdominal pain, back pain, testicular pain, or sore throat.    Review of Systems  Constitutional: Negative for chills, diaphoresis, fatigue and fever.  HENT: Negative for congestion, ear pain, rhinorrhea, sinus pain and sore throat.   Eyes: Negative for pain.  Respiratory: Negative for cough and shortness of breath.   Cardiovascular: Negative for chest pain, palpitations and leg swelling.  Gastrointestinal: Negative for abdominal pain, blood in stool, diarrhea, nausea and vomiting.  Genitourinary: Negative for dysuria, flank pain, frequency, testicular pain and urgency.  Musculoskeletal: Negative for back pain, gait problem and neck pain.  Skin: Positive for color change and rash.   Allergic/Immunologic: Negative for environmental allergies.  Neurological: Negative for dizziness and headaches.  Psychiatric/Behavioral: Positive for sleep disturbance. The patient is not nervous/anxious.     History Past Medical History:  Diagnosis Date  . AAA (abdominal aortic aneurysm) (HCC) 10/06/2018  . Anxiety 09/24/2011  . Atopic dermatitis 11/26/2016  . BPH (benign prostatic hyperplasia) 08/26/2019  . Constipation 05/25/2019  . Costochondritis 04/03/2018  . Depression   . Diabetes mellitus type 2 in obese (HCC) 05/25/2012  . Enlarged prostate   . Erectile dysfunction 06/13/2010   Formatting of this note might be different from the original. Cialis 20 mg daily prn    Last Assessment & Plan:  Formatting of this note might be different from the original. Patient has good results with Cialis 20 mg as needed, but has run out. In the past, he tried Viagra with inadequate results. He was given a refill for #9 with 11 refills.  . Gallstone 10/06/2018  . Headache 04/10/2010   Formatting of this note might be different from the original. Sumatriptan 100 mg prn and hydrocodone/APAP 7.5/500 mg prn  ICD-10 cut over   Last Assessment & Plan:  Formatting of this note might be different from the original. Patient is having a migraine today and was given a sample of relpax 40 mg today because he isn't carrying the sumatriptan with him. He says that he is getting very few migra  . Hearing loss in left ear 01/08/2017  . History of colonic polyps 06/26/2015   3rd colonoscopy in 2016 with Dr Grayling [place]i in [place]  .  Hypercholesterolemia 04/11/2007   Formatting of this note might be different from the original. Crestor 20 mg daily   Last Assessment & Plan:  Formatting of this note might be different from the original. Patient is taking Crestor 20 mg but has some compliance issues, missing it 2-3 times weekly. He says that he misses it because he can't find it and I have suggested that he use a med-minder. His lipids  are at goal and I expect hi  . Hyperlipidemia 05/25/2012  . Hypogonadism male 09/24/2011  . Left knee DJD   . Lumbago 04/25/2014  . Migraine headache 08/03/2012  . Need for prophylactic vaccination and inoculation against influenza 05/13/2013  . Obesity 04/11/2007   Last Assessment & Plan:  Formatting of this note might be different from the original. Patient has gained about 22 pounds since he quit smoking 6 months ago. He is craving food more and says that he can't "fight two devils" at once. I have made a few suggestions about how he could reduce it.  . Other malaise and fatigue 11/01/2012  . Prediabetes 11/26/2016  . Preventative health care 09/22/2014  . Prostatic hypertrophy 04/10/2010   Formatting of this note might be different from the original. Finasteride 5 mg daily  Last Assessment & Plan:  Formatting of this note might be different from the original. Patient isn't having significant symptoms on the finasteride 5 mg daily.  . 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 Patient is having no pulmonary symptoms since he has quit smoking.  . Reduced libido 08/20/2011  . Salivary gland stone 02/28/2016  . Seborrheic keratoses 04/03/2018  . Tobacco use disorder 10/30/2012  . Type 2 diabetes mellitus (HCC) 04/10/2010   Last Assessment & Plan:  Formatting of this note might be different from the original. Patient has had IFG for years and hasn't been able to take my suggestions regarding weight loss and healthy eating. Blood work done last month shows a glucose that is now 129 and A1c done today, POCT was 6.6%. I have told the patient that given his 22 pound weight gain since smoking cessation, this isn't surpris  . Unspecified essential hypertension 05/25/2012   Does not see a cardiologist    He has a past surgical history that includes left hand surgery; Partial knee arthroplasty (Left, 04/06/2013); and  Umbilical hernia repair (2017).   His family history includes Alcohol abuse in his maternal grandfather; Alzheimer's disease in his mother; Cancer in his paternal grandmother; Diabetes in his brother; Heart disease in his paternal grandfather; Hypertension in his father.He reports that he quit smoking about 19 months ago. His smoking use included cigarettes. He has a 15.00 pack-year smoking history. He has never used smokeless tobacco. He reports current alcohol use. He reports that he does not use drugs.  Current Outpatient Medications on File Prior to Visit  Medication Sig Dispense Refill  . aspirin EC 81 MG tablet Take 1 tablet (81 mg total) by mouth daily. 90 tablet 3  . metFORMIN (GLUCOPHAGE) 500 MG tablet TAKE 1 TABLET BY MOUTH EVERY DAY WITH BREAKFAST 90 tablet 0  . rosuvastatin (CRESTOR) 20 MG tablet TAKE 1 TABLET BY MOUTH EVERY DAY 90 tablet 3  . telmisartan-hydrochlorothiazide (MICARDIS HCT) 40-12.5 MG tablet Take 1 tablet by mouth daily. PATIENT NEED TO CONTACT OFFICE FOR FURTHER REFILLS. APPOINTMENT NEEDED 90 tablet 0   No current facility-administered medications on file  prior to visit.     Objective:  Objective  Physical Exam Constitutional:      General: He is not in acute distress.    Appearance: He is well-developed and well-nourished. He is not diaphoretic.  HENT:     Head: Normocephalic and atraumatic.     Right Ear: Tympanic membrane, ear canal and external ear normal.     Left Ear: Tympanic membrane, ear canal and external ear normal.     Nose: Nose normal.  Eyes:     General:        Right eye: No discharge.        Left eye: No discharge.     Extraocular Movements: Extraocular movements intact and EOM normal.     Conjunctiva/sclera: Conjunctivae normal.     Pupils: Pupils are equal, round, and reactive to light.  Neck:     Thyroid: No thyromegaly.     Vascular: No JVD.  Cardiovascular:     Rate and Rhythm: Normal rate and regular rhythm.     Pulses: Normal  pulses and intact distal pulses.          Posterior tibial pulses are 2+ on the right side and 2+ on the left side.     Heart sounds: Normal heart sounds. No murmur heard.   Pulmonary:     Effort: Pulmonary effort is normal. No respiratory distress.     Breath sounds: Normal breath sounds. No wheezing or rales.  Chest:     Chest wall: No tenderness.  Abdominal:     General: Bowel sounds are normal. There is no distension.     Palpations: Abdomen is soft. There is no hepatomegaly, splenomegaly or mass.     Tenderness: There is no abdominal tenderness. There is no guarding or rebound.  Genitourinary:    Penis: Normal.      Prostate: Normal.  Musculoskeletal:        General: No tenderness or edema. Normal range of motion.     Cervical back: Normal range of motion and neck supple.     Right lower leg: No edema.     Left lower leg: No edema.     Comments: 5/5 strength in UE and LE   Lymphadenopathy:     Cervical: No cervical adenopathy.  Skin:    General: Skin is warm and dry.     Findings: No erythema or rash.  Neurological:     Mental Status: He is alert and oriented to person, place, and time.     Cranial Nerves: No cranial nerve deficit.     Motor: No abnormal muscle tone.     Deep Tendon Reflexes: Reflexes normal.     Reflex Scores:      Patellar reflexes are 1+ on the right side and 2+ on the left side.    Comments: Left knee diminished reflex 0-1 Right knee reflex 2   Psychiatric:        Mood and Affect: Mood and affect normal.        Behavior: Behavior normal.        Thought Content: Thought content normal.        Judgment: Judgment normal.    BP 112/64   Pulse 75   Temp 98.1 F (36.7 C)   Resp 16   Ht 5\' 11"  (1.803 m)   Wt 226 lb 9.6 oz (102.8 kg)   SpO2 96%   BMI 31.60 kg/m  Wt Readings from Last 3 Encounters:  09/27/20 226  lb 9.6 oz (102.8 kg)  05/13/20 221 lb 1.9 oz (100.3 kg)  11/24/19 222 lb 4 oz (100.8 kg)     Lab Results  Component Value Date    WBC 7.2 09/27/2020   HGB 14.3 09/27/2020   HCT 40.4 09/27/2020   PLT 228.0 09/27/2020   GLUCOSE 114 (H) 09/27/2020   CHOL 130 09/27/2020   TRIG 75.0 09/27/2020   HDL 41.80 09/27/2020   LDLCALC 73 09/27/2020   ALT 27 09/27/2020   AST 17 09/27/2020   NA 139 09/27/2020   K 4.6 09/27/2020   CL 102 09/27/2020   CREATININE 0.83 09/27/2020   BUN 18 09/27/2020   CO2 32 09/27/2020   TSH 1.44 09/27/2020   PSA 4.64 (H) 09/27/2020   INR 1.00 03/30/2013   HGBA1C 6.8 (H) 09/27/2020   MICROALBUR 0.6 01/08/2017    VAS [place] AAA DUPLEX  Result Date: 04/20/2020 ABDOMINAL AORTA STUDY Indications: Follow up exam for known AAA. Risk Factors: Current smoker. Limitations: Air/bowel gas and obesity.  Comparison Study: No change from prior exam of 04/01/2019. Performing Technologist: Joaquim Nam  Examination Guidelines: A complete evaluation includes B-mode imaging, spectral Doppler, color Doppler, and power Doppler as needed of all accessible portions of each vessel. Bilateral testing is considered an integral part of a complete examination. Limited examinations for reoccurring indications may be performed as noted.  Abdominal Aorta Findings: +--------+-------+----------+----------+---------+--------+--------+ LocationAP (cm)Trans (cm)PSV (cm/s)Waveform ThrombusComments +--------+-------+----------+----------+---------+--------+--------+ Proximal1.6    1.87      97        triphasic                 +--------+-------+----------+----------+---------+--------+--------+ Mid     3.1    3.33      53        triphasic                 +--------+-------+----------+----------+---------+--------+--------+ Distal  1.3    1.75      115       triphasic                 +--------+-------+----------+----------+---------+--------+--------+  Summary: Abdominal Aorta: There is evidence of aneurysm maximum diameter 33 mm of the mid Abdominal aorta.  *See table(s) above for measurements and observations.   Electronically signed by Norman Herrlich MD on 04/20/2020 at 5:10:56 PM.   Final      Assessment & Plan:  Plan    Meds ordered this encounter  Medications  . tamsulosin (FLOMAX) 0.4 MG CAPS capsule    Sig: Take 1 capsule (0.4 mg total) by mouth daily.    Dispense:  90 capsule    Refill:  1  . triamcinolone (KENALOG) 0.1 %    Sig: Apply 1 application topically as needed.    Dispense:  80 g    Refill:  5    Problem List Items Addressed This Visit    Type 2 diabetes mellitus (HCC)    hgba1c acceptable, minimize simple carbs. Increase exercise as tolerated. Continue current meds      Hypercholesterolemia    Tolerating statin, encouraged heart healthy diet, avoid trans fats, minimize simple carbs and saturated fats. Increase exercise as tolerated      Hypertension    Well controlled, no changes to meds. Encouraged heart healthy diet such as the DASH diet and exercise as tolerated.       Preventative health care    Patient encouraged to maintain heart healthy diet, regular exercise, adequate sleep. Consider daily probiotics. Take medications  as prescribed. Labs ordered and reviewed. Immunizations UTD      Relevant Orders   Hemoglobin A1c (Completed)   CBC with Differential/Platelet (Completed)   Comprehensive metabolic panel (Completed)   PSA (Completed)   Lipid panel (Completed)   TSH (Completed)   Atopic dermatitis    Responsive to Triamcinolone refill given.       Prostatic hypertrophy    flomax is helpful and prescription has been refilled      Insomnia    Struggles with poor sleep most nights. He falls asleep OK but routinely wakes up around 3 am and cannot fall back to sleep for 1-2 hours. Wakes choking at times       Sun-damaged skin    Referred to [facility] for surveillance      Relevant Orders   Ambulatory referral to Dermatology   Restless sleeper    With fatigue and frequent wakenings. Will refer to pulmonology to possible sleep study       Relevant Orders   Ambulatory referral to Pulmonology    Other Visit Diagnoses    Hyperlipidemia, unspecified hyperlipidemia type    -  Primary   Relevant Orders   CBC with Differential/Platelet (Completed)   Comprehensive metabolic panel (Completed)   Lipid panel (Completed)   Fatigue, unspecified type       Relevant Orders   Ambulatory referral to Pulmonology      Follow-up: Return in about 6 months (around 03/30/2021).   I,Alexis Bryant,acting as a Neurosurgeon for [name], MD.,have documented all relevant documentation on the behalf of [name], MD,as directed by  [name], MD while in the presence of [name], MD.  I, [name], MD, have reviewed all documentation for this visit. The documentation on 09/28/20 for the exam, diagnosis, procedures, and orders are all accurate and complete.

## 2020-09-27 NOTE — Assessment & Plan Note (Signed)
Struggles with poor sleep most nights. He falls asleep OK but routinely wakes up around 3 am and cannot fall back to sleep for 1-2 hours. Wakes choking at times

## 2020-09-27 NOTE — Patient Instructions (Signed)
Preventive Care 65 Years and Older, Male Preventive care refers to lifestyle choices and visits with your health care provider that can promote health and wellness. This includes:  A yearly physical exam. This is also called an annual wellness visit.  Regular dental and eye exams.  Immunizations.  Screening for certain conditions.  Healthy lifestyle choices, such as: ? Eating a healthy diet. ? Getting regular exercise. ? Not using drugs or products that contain nicotine and tobacco. ? Limiting alcohol use. What can I expect for my preventive care visit? Physical exam Your health care provider will check your:  Height and weight. These may be used to calculate your BMI (body mass index). BMI is a measurement that tells if you are at a healthy weight.  Heart rate and blood pressure.  Body temperature.  Skin for abnormal spots. Counseling Your health care provider may ask you questions about your:  Past medical problems.  Family's medical history.  Alcohol, tobacco, and drug use.  Emotional well-being.  Home life and relationship well-being.  Sexual activity.  Diet, exercise, and sleep habits.  History of falls.  Memory and ability to understand (cognition).  Work and work environment.  Access to firearms. What immunizations do I need? Vaccines are usually given at various ages, according to a schedule. Your health care provider will recommend vaccines for you based on your age, medical history, and lifestyle or other factors, such as travel or where you work.   What tests do I need? Blood tests  Lipid and cholesterol levels. These may be checked every 5 years, or more often depending on your overall health.  Hepatitis C test.  Hepatitis B test. Screening  Lung cancer screening. You may have this screening every year starting at age 55 if you have a 30-pack-year history of smoking and currently smoke or have quit within the past 15 years.  Colorectal  cancer screening. ? All adults should have this screening starting at age 50 and continuing until age 75. ? Your health care provider may recommend screening at age 45 if you are at increased risk. ? You will have tests every 1-10 years, depending on your results and the type of screening test.  Prostate cancer screening. Recommendations will vary depending on your family history and other risks.  Genital exam to check for testicular cancer or hernias.  Diabetes screening. ? This is done by checking your blood sugar (glucose) after you have not eaten for a while (fasting). ? You may have this done every 1-3 years.  Abdominal aortic aneurysm (AAA) screening. You may need this if you are a current or former smoker.  STD (sexually transmitted disease) testing, if you are at risk. Follow these instructions at home: Eating and drinking  Eat a diet that includes fresh fruits and vegetables, whole grains, lean protein, and low-fat dairy products. Limit your intake of foods with high amounts of sugar, saturated fats, and salt.  Take vitamin and mineral supplements as recommended by your health care provider.  Do not drink alcohol if your health care provider tells you not to drink.  If you drink alcohol: ? Limit how much you have to 0-2 drinks a day. ? Be aware of how much alcohol is in your drink. In the [place] one drink equals one 12 oz bottle of beer (355 mL), one 5 oz glass of wine (148 mL), or one 1 oz glass of hard liquor (44 mL).   Lifestyle  Take daily care of your teeth   and gums. Brush your teeth every morning and night with fluoride toothpaste. Floss one time each day.  Stay active. Exercise for at least 30 minutes 5 or more days each week.  Do not use any products that contain nicotine or tobacco, such as cigarettes, e-cigarettes, and chewing tobacco. If you need help quitting, ask your health care provider.  Do not use drugs.  If you are sexually active, practice safe sex.  Use a condom or other form of protection to prevent STIs (sexually transmitted infections).  Talk with your health care provider about taking a low-dose aspirin or statin.  Find healthy ways to cope with stress, such as: ? Meditation, yoga, or listening to music. ? Journaling. ? Talking to a trusted person. ? Spending time with friends and family. Safety  Always wear your seat belt while driving or riding in a vehicle.  Do not drive: ? If you have been drinking alcohol. Do not ride with someone who has been drinking. ? When you are tired or distracted. ? While texting.  Wear a helmet and other protective equipment during sports activities.  If you have firearms in your house, make sure you follow all gun safety procedures. What's next?  Visit your health care provider once a year for an annual wellness visit.  Ask your health care provider how often you should have your eyes and teeth checked.  Stay up to date on all vaccines. This information is not intended to replace advice given to you by your health care provider. Make sure you discuss any questions you have with your health care provider. Document Revised: 04/07/2019 Document Reviewed: 07/03/2018 Elsevier Patient Education  2021 [employer]  

## 2020-09-28 ENCOUNTER — Other Ambulatory Visit: Payer: Self-pay | Admitting: *Deleted

## 2020-09-28 DIAGNOSIS — G479 Sleep disorder, unspecified: Secondary | ICD-10-CM | POA: Insufficient documentation

## 2020-09-28 DIAGNOSIS — L578 Other skin changes due to chronic exposure to nonionizing radiation: Secondary | ICD-10-CM

## 2020-09-28 DIAGNOSIS — Z87891 Personal history of nicotine dependence: Secondary | ICD-10-CM

## 2020-09-28 HISTORY — DX: Sleep disorder, unspecified: G47.9

## 2020-09-28 HISTORY — DX: Other skin changes due to chronic exposure to nonionizing radiation: L57.8

## 2020-09-28 NOTE — Assessment & Plan Note (Signed)
With fatigue and frequent wakenings. Will refer to pulmonology to possible sleep study

## 2020-09-28 NOTE — Assessment & Plan Note (Signed)
Well controlled, no changes to meds. Encouraged heart healthy diet such as the DASH diet and exercise as tolerated.  °

## 2020-09-28 NOTE — Assessment & Plan Note (Addendum)
Patient encouraged to maintain heart healthy diet, regular exercise, adequate sleep. Consider daily probiotics. Take medications as prescribed. Labs ordered and reviewed. Immunizations UTD

## 2020-09-28 NOTE — Assessment & Plan Note (Signed)
Referred to [facility] for surveillance

## 2020-09-28 NOTE — Assessment & Plan Note (Signed)
Responsive to Triamcinolone refill given.

## 2020-09-28 NOTE — Assessment & Plan Note (Signed)
Tolerating statin, encouraged heart healthy diet, avoid trans fats, minimize simple carbs and saturated fats. Increase exercise as tolerated 

## 2020-09-28 NOTE — Assessment & Plan Note (Signed)
hgba1c acceptable, minimize simple carbs. Increase exercise as tolerated. Continue current meds 

## 2020-09-28 NOTE — Assessment & Plan Note (Signed)
flomax is helpful and prescription has been refilled

## 2020-09-30 ENCOUNTER — Other Ambulatory Visit: Payer: Self-pay

## 2020-09-30 MED ORDER — TELMISARTAN-HCTZ 40-12.5 MG PO TABS: 1.0000 | ORAL_TABLET | Freq: Every day | ORAL | 3 refills | Status: DC

## 2020-10-06 ENCOUNTER — Ambulatory Visit (HOSPITAL_BASED_OUTPATIENT_CLINIC_OR_DEPARTMENT_OTHER)
Admission: RE | Admit: 2020-10-06 | Discharge: 2020-10-06 | Disposition: A | Discharge status: Home / Self Care | Payer: BC Managed Care – PPO | Source: Ambulatory Visit | Attending: Family Medicine | Admitting: Family Medicine

## 2020-10-06 ENCOUNTER — Other Ambulatory Visit: Payer: Self-pay

## 2020-10-06 DIAGNOSIS — Z87891 Personal history of nicotine dependence: Secondary | ICD-10-CM | POA: Diagnosis not present

## 2020-10-27 DIAGNOSIS — L821 Other seborrheic keratosis: Secondary | ICD-10-CM | POA: Diagnosis not present

## 2020-10-27 DIAGNOSIS — L820 Inflamed seborrheic keratosis: Secondary | ICD-10-CM | POA: Diagnosis not present

## 2020-10-27 DIAGNOSIS — L218 Other seborrheic dermatitis: Secondary | ICD-10-CM | POA: Diagnosis not present

## 2020-10-27 DIAGNOSIS — L7211 Pilar cyst: Secondary | ICD-10-CM | POA: Diagnosis not present

## 2020-11-01 DIAGNOSIS — R3914 Feeling of incomplete bladder emptying: Secondary | ICD-10-CM | POA: Diagnosis not present

## 2020-11-01 DIAGNOSIS — R972 Elevated prostate specific antigen [PSA]: Secondary | ICD-10-CM | POA: Diagnosis not present

## 2020-11-01 DIAGNOSIS — R3915 Urgency of urination: Secondary | ICD-10-CM | POA: Diagnosis not present

## 2020-11-15 DIAGNOSIS — D485 Neoplasm of uncertain behavior of skin: Secondary | ICD-10-CM | POA: Diagnosis not present

## 2020-11-15 DIAGNOSIS — L7211 Pilar cyst: Secondary | ICD-10-CM | POA: Diagnosis not present

## 2021-01-20 ENCOUNTER — Institutional Professional Consult (permissible substitution): Payer: BC Managed Care – PPO | Admitting: Internal Medicine

## 2021-01-26 NOTE — Progress Notes (Signed)
01/27/21- 58 yoM former smoker for sleep evaluation with concern of restless sleep and fatigue, courtesy of Dr Abner Greenspan.  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 Medical problem list includes Insomnia, HTN, AAA, Hyperlipidemia, Migraine, Reactive Airways Dysfunction, DM2, Obesity, Anxiety/ depression,  Epworth score-10 Body weight today- Covid vax-4 Moderna ------Sleep consult, restless sleep for years with chronic daytime fatigue Bedd time 10-12 PM, usually short latency. Oten wakes 4AM x 1.5 hr, then back to sleep 2 more hours.  Wife tells him of witnessed apneas but he doesn't think he snores much. His main c/o is need to nap during day- interferes. "I could nap any time", but says no problem driving.  When working he blamed daytime tiredness on working hard.  Denies ENT surgery, lung or heart problems despite hx smoking 1 ppd. In [place] low dose screening program.  Prior to Admission medications   Medication Sig Start Date End Date Taking? Authorizing Provider  aspirin EC 81 MG tablet Take 1 tablet (81 mg total) by mouth daily. 10/07/18  Yes Baldo Daub, MD  metFORMIN (GLUCOPHAGE) 500 MG tablet TAKE 1 TABLET BY MOUTH EVERY DAY WITH BREAKFAST 05/16/20  Yes Bradd Canary, MD  rosuvastatin (CRESTOR) 20 MG tablet TAKE 1 TABLET BY MOUTH EVERY DAY 11/19/19  Yes Bradd Canary, MD  tamsulosin (FLOMAX) 0.4 MG CAPS capsule Take 1 capsule (0.4 mg total) by mouth daily. 09/27/20  Yes Bradd Canary, MD  telmisartan-hydrochlorothiazide (MICARDIS HCT) 40-12.5 MG tablet Take 1 tablet by mouth daily. 09/30/20  Yes Baldo Daub, MD  triamcinolone (KENALOG) 0.1 % Apply 1 application topically as needed. 09/27/20  Yes Bradd Canary, MD   Past Medical History:  Diagnosis Date   AAA (abdominal aortic aneurysm) (HCC) 10/06/2018   Anxiety 09/24/2011   Atopic dermatitis 11/26/2016   BPH (benign prostatic hyperplasia) 08/26/2019   Constipation 05/25/2019   Costochondritis 04/03/2018   Depression    Diabetes  mellitus type 2 in obese [facility]) 05/25/2012   Enlarged prostate    Erectile dysfunction 06/13/2010   Formatting of this note might be different from the original. Cialis 20 mg daily prn    Last Assessment & Plan:  Formatting of this note might be different from the original. Patient has good results with Cialis 20 mg as needed, but has run out. In the past, he tried Viagra with inadequate results. He was given a refill for #9 with 11 refills.   Gallstone 10/06/2018   Headache 04/10/2010   Formatting of this note might be different from the original. Sumatriptan 100 mg prn and hydrocodone/APAP 7.5/500 mg prn  ICD-10 cut over   Last Assessment & Plan:  Formatting of this note might be different from the original. Patient is having a migraine today and was given a sample of relpax 40 mg today because he isn't carrying the sumatriptan with him. He says that he is getting very few migra   Hearing loss in left ear 01/08/2017   History of colonic polyps 06/26/2015   3rd colonoscopy in 2016 with Dr Grayling Congress in [place]   Hypercholesterolemia 04/11/2007   Formatting of this note might be different from the original. Crestor 20 mg daily   Last Assessment & Plan:  Formatting of this note might be different from the original. Patient is taking Crestor 20 mg but has some compliance issues, missing it 2-3 times weekly. He says that he misses it because he can't find it and I have suggested that he use a med-minder.  His lipids are at goal and I expect hi   Hyperlipidemia 05/25/2012   Hypogonadism male 09/24/2011   Left knee DJD    Lumbago 04/25/2014   Migraine headache 08/03/2012   Need for prophylactic vaccination and inoculation against influenza 05/13/2013   Obesity 04/11/2007   Last Assessment & Plan:  Formatting of this note might be different from the original. Patient has gained about 22 pounds since he quit smoking 6 months ago. He is craving food more and says that he can't "fight two devils" at once. I have made a  few suggestions about how he could reduce it.   Other malaise and fatigue 11/01/2012   Prediabetes 11/26/2016   Preventative health care 09/22/2014   Prostatic hypertrophy 04/10/2010   Formatting of this note might be different from the original. Finasteride 5 mg daily  Last Assessment & Plan:  Formatting of this note might be different from the original. Patient isn't having significant symptoms on the finasteride 5 mg daily.   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 Patient is having no pulmonary symptoms since he has quit smoking.   Reduced libido 08/20/2011   Salivary gland stone 02/28/2016   Seborrheic keratoses 04/03/2018   Tobacco use disorder 10/30/2012   Type 2 diabetes mellitus (HCC) 04/10/2010   Last Assessment & Plan:  Formatting of this note might be different from the original. Patient has had IFG for years and hasn't been able to take my suggestions regarding weight loss and healthy eating. Blood work done last month shows a glucose that is now 129 and A1c done today, POCT was 6.6%. I have told the patient that given his 22 pound weight gain since smoking cessation, this isn't surpris   Unspecified essential hypertension 05/25/2012   Does not see a cardiologist   Past Surgical History:  Procedure Laterality Date   left hand surgery     PARTIAL KNEE ARTHROPLASTY Left 04/06/2013   Procedure: UNICOMPARTMENTAL LEFT KNEE;  Surgeon: Nilda Simmer, MD;  Location: [facility] OR;  Service: Orthopedics;  Laterality: Left;   UMBILICAL HERNIA REPAIR  2017   w/ small ventral ex mesh   Family History  Problem Relation Age of Onset   Alzheimer's disease Mother    Hypertension Father    Diabetes Brother        ?   Alcohol abuse Maternal Grandfather    Cancer Paternal Grandmother    Heart disease Paternal Grandfather        MI   Social History   Socioeconomic History   Marital status: Married     Spouse name: Not on file   Number of children: Not on file   Years of education: Not on file   Highest education level: Not on file  Occupational History   Not on file  Tobacco Use   Smoking status: Former    Packs/day: 1.00    Years: 15.00    Pack years: 15.00    Types: Cigarettes    Quit date: 02/2019    Years since quitting: 1.9   Smokeless tobacco: Never  Vaping Use   Vaping Use: Every day  Substance and Sexual Activity   Alcohol use: Yes    Comment: 1 beer about every 2 months    Drug use: No   Sexual activity: Yes    Birth control/protection: Condom  Other Topics Concern   Not on file  Social History Narrative   Not on file   Social Determinants of Health   Financial Resource Strain: Not on file  Food Insecurity: Not on file  Transportation Needs: Not on file  Physical Activity: Not on file  Stress: Not on file  Social Connections: Not on file  Intimate Partner Violence: Not on file   ROS-see HPI   + = positive Constitutional:    weight loss, night sweats, fevers, chills, fatigue, lassitude. HEENT:    headaches, difficulty swallowing, tooth/dental problems, sore throat,       +sneezing, itching, ear ache, nasal congestion, post nasal drip, snoring CV:    chest pain, orthopnea, PND, swelling in lower extremities, anasarca,                                  dizziness, palpitations Resp:   +shortness of breath with exertion or at rest.                productive cough,   non-productive cough, coughing up of blood.              change in color of mucus.  wheezing.   Skin:    rash or lesions. GI:  +heartburn, indigestion, abdominal pain, nausea, vomiting, diarrhea,                 change in bowel habits, loss of appetite GU: dysuria, change in color of urine, no urgency or frequency.   flank pain. MS:   +joint pain, stiffness, decreased range of motion, back pain. Neuro-     nothing unusual Psych:  change in mood or affect.  depression or anxiety.   memory  loss.  OBJ- Physical Exam General- Alert, Oriented, Affect-appropriate, Distress- none acute, = overweight Skin- rash-none, lesions- none, excoriation- none Lymphadenopathy- none Head- atraumatic            Eyes- Gross vision intact, PERRLA, conjunctivae and secretions clear            Ears- Hearing, canals-normal            Nose- Clear, no-Septal dev, mucus, polyps, erosion, perforation             Throat- Mallampati II , mucosa clear , drainage- none, tonsils- atrophic, + teeth Neck- flexible , trachea midline, no stridor , thyroid nl, carotid no bruit Chest - symmetrical excursion , unlabored           Heart/CV- RRR , no murmur , no gallop  , no rub, nl s1 s2                           - JVD- none , edema- none, stasis changes- none, varices- none           Lung- clear to P&A, wheeze- none, cough- none , dullness-none, rub- none           Chest wall-  Abd-  Br/ Gen/ Rectal- Not done, not indicated Extrem- cyanosis- none, clubbing, none, atrophy- none, strength- nl Neuro- grossly intact to observation

## 2021-01-27 ENCOUNTER — Encounter: Payer: Self-pay | Admitting: Internal Medicine

## 2021-01-27 ENCOUNTER — Other Ambulatory Visit: Payer: Self-pay

## 2021-01-27 ENCOUNTER — Ambulatory Visit (INDEPENDENT_AMBULATORY_CARE_PROVIDER_SITE_OTHER): Payer: BC Managed Care – PPO | Admitting: Internal Medicine

## 2021-01-27 VITALS — BP 134/84 | HR 81 | Temp 98.4°F | Ht 71.0 in | Wt 233.4 lb

## 2021-01-27 DIAGNOSIS — R5383 Other fatigue: Secondary | ICD-10-CM | POA: Diagnosis not present

## 2021-01-27 DIAGNOSIS — F5101 Primary insomnia: Secondary | ICD-10-CM

## 2021-01-27 DIAGNOSIS — G4730 Sleep apnea, unspecified: Secondary | ICD-10-CM

## 2021-01-27 NOTE — Patient Instructions (Signed)
Order- schedule home sleep test    dx sleep disordered breathing  Please call us for results and recommendations about 2 weeks after your sleep test is done.

## 2021-01-27 NOTE — Assessment & Plan Note (Signed)
He considers this his primary sleep concern because it interferes. Witnessed apneas suggest possible OSA discussed. Plan- sleep study

## 2021-01-27 NOTE — Assessment & Plan Note (Signed)
Now retired. He doesn't mind sitting up to watch tv occasionally. This may be related to or separate from his daytime sleepiness

## 2021-01-31 DIAGNOSIS — Z125 Encounter for screening for malignant neoplasm of prostate: Secondary | ICD-10-CM | POA: Diagnosis not present

## 2021-02-09 DIAGNOSIS — R972 Elevated prostate specific antigen [PSA]: Secondary | ICD-10-CM | POA: Diagnosis not present

## 2021-02-09 DIAGNOSIS — R3914 Feeling of incomplete bladder emptying: Secondary | ICD-10-CM | POA: Diagnosis not present

## 2021-02-09 DIAGNOSIS — R3915 Urgency of urination: Secondary | ICD-10-CM | POA: Diagnosis not present

## 2021-03-14 ENCOUNTER — Other Ambulatory Visit: Payer: Self-pay

## 2021-03-14 ENCOUNTER — Ambulatory Visit: Payer: BC Managed Care – PPO

## 2021-03-14 DIAGNOSIS — G4730 Sleep apnea, unspecified: Secondary | ICD-10-CM

## 2021-03-14 DIAGNOSIS — R0683 Snoring: Secondary | ICD-10-CM

## 2021-03-17 DIAGNOSIS — R0683 Snoring: Secondary | ICD-10-CM | POA: Diagnosis not present

## 2021-04-03 ENCOUNTER — Telehealth: Payer: Self-pay | Admitting: Internal Medicine

## 2021-04-03 NOTE — Telephone Encounter (Signed)
Pt had HST performed 03/14/21 and is requesting to know the results of study. Dr. Maple Hudson, please advise.

## 2021-04-05 NOTE — Telephone Encounter (Signed)
Sleep study was wihin normal limits. He does not have sleep apnea. I suggest he emphasize getting more sleep. If he wants to keep October appointment, I will go over study with him then and consider otpions.

## 2021-04-05 NOTE — Patient Instructions (Addendum)
Health Maintenance Due  Topic Date Due   COLONOSCOPY (Pts 45-21yrs Insurance coverage will need to be confirmed)  Never done   INFLUENZA VACCINE  02/20/2021   COVID-19 Vaccine (5 - Booster for Pfizer series) 02/25/2021   HEMOGLOBIN A1C  03/30/2021     Paxlovid is the new COVID medication we can give you if you get COVID so make sure you test if you have symptoms because we have to treat by day 5 of symptoms for it to be effective. If you are positive let us know so we can treat. If a home test is negative and your symptoms are persistent get a PCR test. Can check testing locations at [email or web address] If you are positive we will make an appointment with [place] and we will send in Paxlovid if you would like it. Check with your pharmacy before we meet to confirm they have it in stock, if they do not then we can get the prescription at the [facility]

## 2021-04-05 NOTE — Progress Notes (Signed)
Subjective:   By signing my name below, I, [name], attest that this documentation has been prepared under the direction and in the presence of [name] 04/06/2021   Patient ID: Nathan Hess, male    DOB: 09-Nov-1950, 70 y.o.   MRN: [id number]  Chief Complaint  Patient presents with   6 months f/u    Right jaw pain     HPI Patient is in today for an office visit and other chronic medical concerns. He reports, since last visit his PSA numbers were slightly normal however had seen a urologist and stated his numbers lowered. Urologist also found large quantities of unreleased urine and he states he wakes up once in the middle of the night to urinate. He states he occasionally have bladder spasms that's rare however denies hematuria or frequency.  He also stated that he was suppose to have a follow-up appointment but have not heard back from them around that time. He is going to get the new COVID vaccination. Colonoscopy completed 07/03/2012 and normal with no polyps or cancers. No c/o jaw pain in visit, He does note increased anxiety but denies depression. He notes his anxieyt can be debilitating at times and keeps him from doing things he enjoys. He also notes diffuse arthritic joint stiffness and pain which improves some with activity  Patient denies chest pains, shortness of breath, palpitations, chest tightness, blood in stool, dizziness, fatigue, headaches, syncope, GU or GI c/o.    Covid-19 vaccination and Flu vaccination:  Colonoscopy never done and eye exam completed 06/29/2019  Patient denies chest pains, shortness of breath, palpitations, chest tightness, blood in stool, dizziness, fatigue, headaches, syncope, GU or GI c/o.   Past Medical History:  Diagnosis Date   AAA (abdominal aortic aneurysm) (HCC) 10/06/2018   Anxiety 09/24/2011   Atopic dermatitis 11/26/2016   BPH (benign prostatic hyperplasia) 08/26/2019   Constipation 05/25/2019   Costochondritis 04/03/2018    Depression    Diabetes mellitus type 2 in obese (HCC) 05/25/2012   Enlarged prostate    Erectile dysfunction 06/13/2010   Formatting of this note might be different from the original. Cialis 20 mg daily prn    Last Assessment & Plan:  Formatting of this note might be different from the original. Patient has good results with Cialis 20 mg as needed, but has run out. In the past, he tried Viagra with inadequate results. He was given a refill for #9 with 11 refills.   Gallstone 10/06/2018   Headache 04/10/2010   Formatting of this note might be different from the original. Sumatriptan 100 mg prn and hydrocodone/APAP 7.5/500 mg prn  ICD-10 cut over   Last Assessment & Plan:  Formatting of this note might be different from the original. Patient is having a migraine today and was given a sample of relpax 40 mg today because he isn't carrying the sumatriptan with him. He says that he is getting very few migra   Hearing loss in left ear 01/08/2017   History of colonic polyps 06/26/2015   3rd colonoscopy in 2016 with Dr Grayling Congress in [place]   Hypercholesterolemia 04/11/2007   Formatting of this note might be different from the original. Crestor 20 mg daily   Last Assessment & Plan:  Formatting of this note might be different from the original. Patient is taking Crestor 20 mg but has some compliance issues, missing it 2-3 times weekly. He says that he misses it because he can't find it and I  have suggested that he use a med-minder. His lipids are at goal and I expect hi   Hyperlipidemia 05/25/2012   Hypogonadism male 09/24/2011   Left knee DJD    Lumbago 04/25/2014   Migraine headache 08/03/2012   Need for prophylactic vaccination and inoculation against influenza 05/13/2013   Obesity 04/11/2007   Last Assessment & Plan:  Formatting of this note might be different from the original. Patient has gained about 22 pounds since he quit smoking 6 months ago. He is craving food more and says that he can't "fight two  devils" at once. I have made a few suggestions about how he could reduce it.   Other malaise and fatigue 11/01/2012   Prediabetes 11/26/2016   Preventative health care 09/22/2014   Prostatic hypertrophy 04/10/2010   Formatting of this note might be different from the original. Finasteride 5 mg daily  Last Assessment & Plan:  Formatting of this note might be different from the original. Patient isn't having significant symptoms on the finasteride 5 mg daily.   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 Patient is having no pulmonary symptoms since he has quit smoking.   Reduced libido 08/20/2011   Salivary gland stone 02/28/2016   Seborrheic keratoses 04/03/2018   Tobacco use disorder 10/30/2012   Type 2 diabetes mellitus (HCC) 04/10/2010   Last Assessment & Plan:  Formatting of this note might be different from the original. Patient has had IFG for years and hasn't been able to take my suggestions regarding weight loss and healthy eating. Blood work done last month shows a glucose that is now 129 and A1c done today, POCT was 6.6%. I have told the patient that given his 22 pound weight gain since smoking cessation, this isn't surpris   Unspecified essential hypertension 05/25/2012   Does not see a cardiologist    Past Surgical History:  Procedure Laterality Date   left hand surgery     PARTIAL KNEE ARTHROPLASTY Left 04/06/2013   Procedure: UNICOMPARTMENTAL LEFT KNEE;  Surgeon: Nilda Simmer, MD;  Location: [facility] OR;  Service: Orthopedics;  Laterality: Left;   UMBILICAL HERNIA REPAIR  2017   w/ small ventral ex mesh    Family History  Problem Relation Age of Onset   Alzheimer's disease Mother    Hypertension Father    Diabetes Brother        ?   Alcohol abuse Maternal Grandfather    Cancer Paternal Grandmother    Heart disease Paternal Grandfather        MI    Social History   Socioeconomic  History   Marital status: Married    Spouse name: Not on file   Number of children: Not on file   Years of education: Not on file   Highest education level: Not on file  Occupational History   Not on file  Tobacco Use   Smoking status: Former    Packs/day: 1.00    Years: 15.00    Pack years: 15.00    Types: Cigarettes    Quit date: 02/2019    Years since quitting: 2.1   Smokeless tobacco: Never  Vaping Use   Vaping Use: Every day  Substance and Sexual Activity   Alcohol use: Yes    Comment: 1 beer about every 2 months    Drug use: No   Sexual activity: Yes    Birth control/protection: Condom  Other Topics Concern   Not on file  Social History Narrative   Not on file   Social Determinants of Health   Financial Resource Strain: Not on file  Food Insecurity: Not on file  Transportation Needs: Not on file  Physical Activity: Not on file  Stress: Not on file  Social Connections: Not on file  Intimate Partner Violence: Not on file    Outpatient Medications Prior to Visit  Medication Sig Dispense Refill   aspirin EC 81 MG tablet Take 1 tablet (81 mg total) by mouth daily. 90 tablet 3   metFORMIN (GLUCOPHAGE) 500 MG tablet TAKE 1 TABLET BY MOUTH EVERY DAY WITH BREAKFAST 90 tablet 0   rosuvastatin (CRESTOR) 20 MG tablet TAKE 1 TABLET BY MOUTH EVERY DAY 90 tablet 3   tamsulosin (FLOMAX) 0.4 MG CAPS capsule Take 1 capsule (0.4 mg total) by mouth daily. 90 capsule 1   telmisartan-hydrochlorothiazide (MICARDIS HCT) 40-12.5 MG tablet Take 1 tablet by mouth daily. 90 tablet 3   triamcinolone (KENALOG) 0.1 % Apply 1 application topically as needed. 80 g 5   No facility-administered medications prior to visit.    No Known Allergies  Review of Systems  Constitutional:  Negative for fever and malaise/fatigue.  HENT:  Negative for sinus pain and sore throat.   Eyes:  Negative for pain and redness.  Respiratory:  Negative for cough and shortness of breath.   Cardiovascular:   Positive for palpitations. Negative for chest pain and leg swelling.  Gastrointestinal:  Negative for abdominal pain, blood in stool, nausea and vomiting.  Genitourinary:  Positive for dysuria (bladder spasms). Negative for frequency and urgency.  Musculoskeletal:  Positive for joint pain. Negative for back pain, falls and myalgias.  Skin:  Negative for itching and rash.  Neurological:  Negative for dizziness, weakness and headaches.  Psychiatric/Behavioral:  Negative for depression, memory loss and suicidal ideas. The patient is nervous/anxious. The patient does not have insomnia.       Objective:    Physical Exam Constitutional:      Appearance: Normal appearance. He is not toxic-appearing.  HENT:     Head: Atraumatic.     Right Ear: Tympanic membrane and external ear normal.     Left Ear: Tympanic membrane and external ear normal.  Eyes:     General: No scleral icterus.    Extraocular Movements: Extraocular movements intact.     Pupils: Pupils are equal, round, and reactive to light.  Cardiovascular:     Rate and Rhythm: Normal rate and regular rhythm.     Pulses: Normal pulses.     Heart sounds: Normal heart sounds. No murmur heard. Pulmonary:     Effort: Pulmonary effort is normal.     Breath sounds: Normal breath sounds.  Abdominal:     General: Abdomen is flat.     Palpations: Abdomen is soft.     Tenderness: There is no right CVA tenderness or left CVA tenderness.  Musculoskeletal:        General: Normal range of motion.     Cervical back: Normal range of motion and neck supple. No tenderness.     Right lower leg: No edema.     Left lower leg: No edema.  Lymphadenopathy:     Cervical: No cervical adenopathy.  Skin:    General: Skin is warm and dry.  Neurological:     General: No focal deficit present.     Mental Status: He is alert and oriented to person,  place, and time.     Deep Tendon Reflexes: Reflexes normal.  Psychiatric:        Mood and Affect: Mood  normal.        Behavior: Behavior normal.    BP 133/77   Pulse 76   Temp 98.1 F (36.7 C)   Resp 16   Wt 229 lb 3.2 oz (104 kg)   SpO2 98%   BMI 31.97 kg/m  Wt Readings from Last 3 Encounters:  04/06/21 229 lb 3.2 oz (104 kg)  01/27/21 233 lb 6.4 oz (105.9 kg)  09/27/20 226 lb 9.6 oz (102.8 kg)    Diabetic Foot Exam - Simple   No data filed    Lab Results  Component Value Date   WBC 7.2 09/27/2020   HGB 14.3 09/27/2020   HCT 40.4 09/27/2020   PLT 228.0 09/27/2020   GLUCOSE 114 (H) 09/27/2020   CHOL 130 09/27/2020   TRIG 75.0 09/27/2020   HDL 41.80 09/27/2020   LDLCALC 73 09/27/2020   ALT 27 09/27/2020   AST 17 09/27/2020   NA 139 09/27/2020   K 4.6 09/27/2020   CL 102 09/27/2020   CREATININE 0.83 09/27/2020   BUN 18 09/27/2020   CO2 32 09/27/2020   TSH 1.44 09/27/2020   PSA 4.64 (H) 09/27/2020   INR 1.00 03/30/2013   HGBA1C 6.8 (H) 09/27/2020   MICROALBUR 0.6 01/08/2017    Lab Results  Component Value Date   TSH 1.44 09/27/2020   Lab Results  Component Value Date   WBC 7.2 09/27/2020   HGB 14.3 09/27/2020   HCT 40.4 09/27/2020   MCV 92.7 09/27/2020   PLT 228.0 09/27/2020   Lab Results  Component Value Date   NA 139 09/27/2020   K 4.6 09/27/2020   CO2 32 09/27/2020   GLUCOSE 114 (H) 09/27/2020   BUN 18 09/27/2020   CREATININE 0.83 09/27/2020   BILITOT 0.6 09/27/2020   ALKPHOS 67 09/27/2020   AST 17 09/27/2020   ALT 27 09/27/2020   PROT 7.0 09/27/2020   ALBUMIN 4.5 09/27/2020   CALCIUM 9.6 09/27/2020   GFR 88.96 09/27/2020   Lab Results  Component Value Date   CHOL 130 09/27/2020   Lab Results  Component Value Date   HDL 41.80 09/27/2020   Lab Results  Component Value Date   LDLCALC 73 09/27/2020   Lab Results  Component Value Date   TRIG 75.0 09/27/2020   Lab Results  Component Value Date   CHOLHDL 3 09/27/2020   Lab Results  Component Value Date   HGBA1C 6.8 (H) 09/27/2020       Assessment & Plan:   Problem List  Items Addressed This Visit     Hypercholesterolemia    Encourage heart healthy diet such as MIND or DASH diet, increase exercise, avoid trans fats, simple carbohydrates and processed foods, consider a krill or fish or flaxseed oil cap daily.       Relevant Orders   Lipid panel   Hypertension    Well controlled, no changes to meds. Encouraged heart healthy diet such as the DASH diet and exercise as tolerated.       Relevant Orders   CBC   Comprehensive metabolic panel   TSH   Enlarged prostate    On Finesteride and tamulosin and no significant change in daily symptoms he will follow up with urology, usually gets up roughly once nightly. No hematuria or dysuria does note some suprapubic pressure/cramp in am at  times. Resolves with emptying      Anxiety    He denies anhedonia or depression but notes increased anxiety which compromises his ability to do things he would like to do. Will try Buspar 5 mg po bid x 7 days and then increase to tid  If no side effects but inadequate response will then increase to 10 mg tid. F/u in 10-12 weeks      Relevant Medications   busPIRone (BUSPAR) 5 MG tablet   Elevated PSA    Was referred to [facility] and his PSA normalized so no biopsy to date. No new concerns      Relevant Orders   PSA   Other Visit Diagnoses     Hyperglycemia    -  Primary   Relevant Orders   Hemoglobin A1c       F/U in 3 months  Meds ordered this encounter  Medications   busPIRone (BUSPAR) 5 MG tablet    Sig: Take 1 tablet (5 mg total) by mouth 3 (three) times daily.    Dispense:  90 tablet    Refill:  1    I, [name], MD, personally preformed the services described in this documentation.  All medical record entries made by the scribe were at my direction and in my presence.  I have reviewed the chart and discharge instructions (if applicable) and agree that the record reflects my personal performance and is accurate and complete. [name]  04/06/2021   I,Jada Bradford,acting as a scribe for [name], MD.,have documented all relevant documentation on the behalf of [name], MD,as directed by  [name], MD while in the presence of [name], MD.  I, Bradd Canary, MD personally performed the services described in this documentation. All medical record entries made by the scribe were at my direction and in my presence. I have reviewed the chart and agree that the record reflects my personal performance and is accurate and complete     [name], MD

## 2021-04-06 ENCOUNTER — Ambulatory Visit: Payer: BC Managed Care – PPO | Admitting: Family Medicine

## 2021-04-06 ENCOUNTER — Other Ambulatory Visit: Payer: Self-pay

## 2021-04-06 ENCOUNTER — Encounter: Payer: Self-pay | Admitting: Family Medicine

## 2021-04-06 VITALS — BP 133/77 | HR 76 | Temp 98.1°F | Resp 16 | Wt 229.2 lb

## 2021-04-06 DIAGNOSIS — N400 Enlarged prostate without lower urinary tract symptoms: Secondary | ICD-10-CM

## 2021-04-06 DIAGNOSIS — E7800 Pure hypercholesterolemia, unspecified: Secondary | ICD-10-CM

## 2021-04-06 DIAGNOSIS — I10 Essential (primary) hypertension: Secondary | ICD-10-CM | POA: Diagnosis not present

## 2021-04-06 DIAGNOSIS — F419 Anxiety disorder, unspecified: Secondary | ICD-10-CM

## 2021-04-06 DIAGNOSIS — R972 Elevated prostate specific antigen [PSA]: Secondary | ICD-10-CM

## 2021-04-06 DIAGNOSIS — R739 Hyperglycemia, unspecified: Secondary | ICD-10-CM | POA: Diagnosis not present

## 2021-04-06 DIAGNOSIS — Z23 Encounter for immunization: Secondary | ICD-10-CM | POA: Diagnosis not present

## 2021-04-06 HISTORY — DX: Elevated prostate specific antigen (PSA): R97.20

## 2021-04-06 LAB — COMPREHENSIVE METABOLIC PANEL
ALT: 35 U/L (ref 0–53)
AST: 19 U/L (ref 0–37)
Albumin: 4.5 g/dL (ref 3.5–5.2)
Alkaline Phosphatase: 74 U/L (ref 39–117)
BUN: 14 mg/dL (ref 6–23)
CO2: 29 mEq/L (ref 19–32)
Calcium: 9.4 mg/dL (ref 8.4–10.5)
Chloride: 101 mEq/L (ref 96–112)
Creatinine, Ser: 0.82 mg/dL (ref 0.40–1.50)
GFR: 88.96 mL/min (ref >60.00)
Glucose, Bld: 125 mg/dL — ABNORMAL HIGH (ref 70–99)
Potassium: 4.8 mEq/L (ref 3.5–5.1)
Sodium: 137 mEq/L (ref 135–145)
Total Bilirubin: 0.4 mg/dL (ref 0.2–1.2)
Total Protein: 7.3 g/dL (ref 6.0–8.3)

## 2021-04-06 LAB — LIPID PANEL
Cholesterol: 140 mg/dL (ref 0–200)
HDL: 43.9 mg/dL (ref >39.00)
LDL Cholesterol: 81 mg/dL (ref 0–99)
NonHDL: 96.25
Total CHOL/HDL Ratio: 3
Triglycerides: 78 mg/dL (ref 0.0–149.0)
VLDL: 15.6 mg/dL (ref 0.0–40.0)

## 2021-04-06 LAB — CBC
HCT: 42.6 % (ref 39.0–52.0)
Hemoglobin: 14.6 g/dL (ref 13.0–17.0)
MCHC: 34.3 g/dL (ref 30.0–36.0)
MCV: 94.2 fl (ref 78.0–100.0)
Platelets: 230 10*3/uL (ref 150.0–400.0)
RBC: 4.52 Mil/uL (ref 4.22–5.81)
RDW: 12.8 % (ref 11.5–15.5)
WBC: 11.8 10*3/uL — ABNORMAL HIGH (ref 4.0–10.5)

## 2021-04-06 LAB — PSA: PSA: 2.79 ng/mL (ref 0.10–4.00)

## 2021-04-06 LAB — TSH: TSH: 1.19 u[IU]/mL (ref 0.35–5.50)

## 2021-04-06 LAB — HEMOGLOBIN A1C: Hgb A1c MFr Bld: 7.4 % — ABNORMAL HIGH (ref 4.6–6.5)

## 2021-04-06 MED ORDER — BUSPIRONE HCL 5 MG PO TABS: 5.0000 mg | ORAL_TABLET | Freq: Three times a day (TID) | ORAL | 1 refills | Status: DC

## 2021-04-06 NOTE — Assessment & Plan Note (Signed)
Was referred to [facility] and his PSA normalized so no biopsy to date. No new concerns

## 2021-04-06 NOTE — Telephone Encounter (Signed)
I called and spoke with patient regarding HST and Dr. Vicenta Dunning. Patient verbalized understanding and has been seeing an urologist with help with others issues affecting sleep so wants to see where that leads for now and wants to cancel Oct appt and will call back if need to schedule another appt. I will cancel Oct appt. Patient verbalized understading, nothing further needed.

## 2021-04-06 NOTE — Assessment & Plan Note (Signed)
Well controlled, no changes to meds. Encouraged heart healthy diet such as the DASH diet and exercise as tolerated.  °

## 2021-04-06 NOTE — Assessment & Plan Note (Addendum)
On Finesteride and tamulosin and no significant change in daily symptoms he will follow up with urology, usually gets up roughly once nightly. No hematuria or dysuria does note some suprapubic pressure/cramp in am at times. Resolves with emptying

## 2021-04-06 NOTE — Assessment & Plan Note (Signed)
Encourage heart healthy diet such as MIND or DASH diet, increase exercise, avoid trans fats, simple carbohydrates and processed foods, consider a krill or fish or flaxseed oil cap daily.  °

## 2021-04-06 NOTE — Assessment & Plan Note (Signed)
He denies anhedonia or depression but notes increased anxiety which compromises his ability to do things he would like to do. Will try Buspar 5 mg po bid x 7 days and then increase to tid  If no side effects but inadequate response will then increase to 10 mg tid. F/u in 10-12 weeks

## 2021-04-07 ENCOUNTER — Other Ambulatory Visit: Payer: Self-pay

## 2021-04-07 DIAGNOSIS — D72829 Elevated white blood cell count, unspecified: Secondary | ICD-10-CM

## 2021-04-10 ENCOUNTER — Other Ambulatory Visit: Payer: Self-pay

## 2021-04-10 DIAGNOSIS — E785 Hyperlipidemia, unspecified: Secondary | ICD-10-CM

## 2021-04-10 DIAGNOSIS — I10 Essential (primary) hypertension: Secondary | ICD-10-CM

## 2021-04-10 DIAGNOSIS — F32A Depression, unspecified: Secondary | ICD-10-CM

## 2021-04-10 DIAGNOSIS — E1169 Type 2 diabetes mellitus with other specified complication: Secondary | ICD-10-CM

## 2021-04-10 DIAGNOSIS — M545 Low back pain, unspecified: Secondary | ICD-10-CM

## 2021-04-10 DIAGNOSIS — E669 Obesity, unspecified: Secondary | ICD-10-CM

## 2021-04-10 DIAGNOSIS — F17200 Nicotine dependence, unspecified, uncomplicated: Secondary | ICD-10-CM

## 2021-04-10 MED ORDER — METFORMIN HCL 500 MG PO TABS: 500.0000 mg | ORAL_TABLET | Freq: Two times a day (BID) | ORAL | 1 refills | Status: DC

## 2021-04-25 ENCOUNTER — Other Ambulatory Visit: Payer: BC Managed Care – PPO

## 2021-04-28 ENCOUNTER — Other Ambulatory Visit: Payer: Self-pay | Admitting: Family Medicine

## 2021-05-01 ENCOUNTER — Ambulatory Visit: Payer: BC Managed Care – PPO | Admitting: Internal Medicine

## 2021-05-03 ENCOUNTER — Other Ambulatory Visit: Payer: Self-pay | Admitting: Family Medicine

## 2021-05-24 ENCOUNTER — Telehealth: Payer: Self-pay | Admitting: *Deleted

## 2021-05-24 NOTE — Telephone Encounter (Signed)
Patient was advised to go to ER by nurse

## 2021-05-24 NOTE — Telephone Encounter (Signed)
Who Is Calling Patient / Member / Family / Caregiver Call Type Triage / Clinical Relationship To Patient Self Return Phone Number [phone] (Primary) Chief Complaint CHEST PAIN - pain, pressure, heaviness or tightness Reason for Call Symptomatic / Request for Health Information Initial Comment PT thinks he is having a gallbladder attack but doesn't know for sure, PT is having a pain in upper right chest area. PT had the same kinda pain 3 years ago when he went to the hospital and was told he had gallstones. GOTO Facility Not Listed [facility] [address] Translation No Nurse Assessment Nurse: Nunzio Cory, Charity fundraiser, Sherrie Date/Time (Eastern Time): 05/24/2021 9:56:15 AM Confirm and document reason for call. If symptomatic, describe symptoms. ---Caller states thinks having a gallbladder attack. Same happened about 3 yrs ago, Dx with gallstones. Now with same symptoms. +Upper right abd dull steady pain. No vomiting, +Diarrhea, +bloated, +feels gassy. No fever.  05/24/2021 10:09:00 AM Go to ED Now Yes Nunzio Cory, RN, Sherrie  Comments User: Holland Commons, RN Date/Time (Eastern Time): 05/24/2021 10:01:02 AM States pain 4-5/10 at present. User: Holland Commons, RN Date/Time (Eastern Time): 05/24/2021 10:02:38 AM States pain now going on for the last 1.5 hrs. Referrals GO TO FACILITY OTHER - SPECIFY

## 2021-06-23 DIAGNOSIS — R079 Chest pain, unspecified: Secondary | ICD-10-CM | POA: Diagnosis not present

## 2021-06-23 DIAGNOSIS — R1011 Right upper quadrant pain: Secondary | ICD-10-CM | POA: Diagnosis not present

## 2021-06-23 DIAGNOSIS — R12 Heartburn: Secondary | ICD-10-CM | POA: Diagnosis not present

## 2021-06-27 ENCOUNTER — Telehealth (INDEPENDENT_AMBULATORY_CARE_PROVIDER_SITE_OTHER): Payer: BC Managed Care – PPO | Admitting: Family Medicine

## 2021-06-27 DIAGNOSIS — M25561 Pain in right knee: Secondary | ICD-10-CM

## 2021-06-27 DIAGNOSIS — I10 Essential (primary) hypertension: Secondary | ICD-10-CM

## 2021-06-27 DIAGNOSIS — E7800 Pure hypercholesterolemia, unspecified: Secondary | ICD-10-CM

## 2021-06-27 DIAGNOSIS — K8020 Calculus of gallbladder without cholecystitis without obstruction: Secondary | ICD-10-CM

## 2021-06-27 DIAGNOSIS — K5900 Constipation, unspecified: Secondary | ICD-10-CM | POA: Diagnosis not present

## 2021-06-27 DIAGNOSIS — F419 Anxiety disorder, unspecified: Secondary | ICD-10-CM

## 2021-06-27 DIAGNOSIS — E1165 Type 2 diabetes mellitus with hyperglycemia: Secondary | ICD-10-CM | POA: Diagnosis not present

## 2021-06-27 DIAGNOSIS — K219 Gastro-esophageal reflux disease without esophagitis: Secondary | ICD-10-CM

## 2021-06-27 HISTORY — DX: Pain in right knee: M25.561

## 2021-06-27 HISTORY — DX: Gastro-esophageal reflux disease without esophagitis: K21.9

## 2021-06-27 NOTE — Assessment & Plan Note (Signed)
Roughly 4-6 weeks ago he turned and it just felt loose and it made some noises. But no pain or swelling. Encouraged moist heat and gentle stretching as tolerated. May try Acetaminophen and prescription meds as directed and report if symptoms worsen or seek immediate care. Hurts when lying down at night. For now he wants to give it more time, he did have a  Left TKR roughly 8 years ago and that has held up.

## 2021-06-27 NOTE — Assessment & Plan Note (Signed)
Had a second attack in 2 years about a month ago. He feels better now. Has set up an Ultrasound with his GI doc Dr Roselyn Bering next week. Avoid fatty and spicy food

## 2021-06-27 NOTE — Assessment & Plan Note (Signed)
Encourage heart healthy diet such as MIND or DASH diet, increase exercise, avoid trans fats, simple carbohydrates and processed foods, consider a krill or fish or flaxseed oil cap daily.  °

## 2021-06-27 NOTE — Assessment & Plan Note (Signed)
Was started on Pantoprazole and is doing better.

## 2021-06-27 NOTE — Assessment & Plan Note (Signed)
Feeling much better with Buspar 5 mg addition. Usually just takes an am dose but occasionally does take a second dose. No changes will continue tid prn

## 2021-06-27 NOTE — Progress Notes (Signed)
Virtual telephone visit    Virtual Visit via Telephone Note   This visit type was conducted due to national recommendations for restrictions regarding the COVID-19 Pandemic (e.g. social distancing) in an effort to limit this patient's exposure and mitigate transmission in our community. Due to his co-morbid illnesses, this patient is at least at moderate risk for complications without adequate follow up. This format is felt to be most appropriate for this patient at this time. The patient did not have access to video technology or had technical difficulties with video requiring transitioning to audio format only (telephone). Physical exam was limited to content and character of the telephone converstion. S. Chism, CMA was able to get the patient set up on a telephone visit.   Patient location: Car Patient and provider in visit Provider location: Office  I discussed the limitations of evaluation and management by telemedicine and the availability of in person appointments. The patient expressed understanding and agreed to proceed.   Visit Date: 06/27/2021   Today's healthcare provider: [name], MD     Subjective:    Patient ID: Nathan Hess, male    DOB: Nov 28, 1950, 70 y.o.   MRN: [id number]  Chief Complaint  Patient presents with   Follow-up    HPI Patient is in today for a telephone visit for follow up on medication management and  digestion trouble. He currently still smokes a pack daily. Denies CP/palp/SOB/HA/congestion/fevers/GI or GU c/o. Taking meds as prescribed.  Buspar 5 mg: Made a big difference for him and experiences no side effects. He reports that he sticks to taking it once-twice daily on average. Buspar made a big difference. He sticks with taking it once to twice daily on average.  Metformin: He reports that his dosage was recently doubled. He has no symptoms of shakiness or low sugar. However he feels lightheadedness when bending over to pick up  things.  Gallbladder attack: About a month ago experienced symptoms associated with a gallbladder attack that he had dx 3 years ago. However this time he did not go to the ER and reports that symptoms lasted for 2-3 days. He is scheduled for an ultrasound next week to check on it.  Stomach issues: He reports that when he went to Dr. Roselyn Bering for his gallbladder attack symptoms they addressed his acid reflex and bloating issues that he has been experiencing and px him pantoprazole. Since starting pantoprazole he feels better and states that he is scheduled for an endoscopy with Dr. Roselyn Bering next Monday.  Right knees: Did not have a fall or injuries, but whenever he turns he experiences crepitus and weakness with no pain. He has PSHx of left knee replacement at [facility].  He is also experiencing back pain when bending down to pick things up.  Past Medical History:  Diagnosis Date   AAA (abdominal aortic aneurysm) (HCC) 10/06/2018   Anxiety 09/24/2011   Atopic dermatitis 11/26/2016   BPH (benign prostatic hyperplasia) 08/26/2019   Constipation 05/25/2019   Costochondritis 04/03/2018   Depression    Diabetes mellitus type 2 in obese (HCC) 05/25/2012   Enlarged prostate    Erectile dysfunction 06/13/2010   Formatting of this note might be different from the original. Cialis 20 mg daily prn    Last Assessment & Plan:  Formatting of this note might be different from the original. Patient has good results with Cialis 20 mg as needed, but has run out. In the past, he tried Viagra with inadequate results.  He was given a refill for #9 with 11 refills.   Gallstone 10/06/2018   Headache 04/10/2010   Formatting of this note might be different from the original. Sumatriptan 100 mg prn and hydrocodone/APAP 7.5/500 mg prn  ICD-10 cut over   Last Assessment & Plan:  Formatting of this note might be different from the original. Patient is having a migraine today and was given a sample of relpax 40 mg today because he isn't  carrying the sumatriptan with him. 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 Crestor 20 mg daily   Last Assessment & Plan:  Formatting of this note might be different from the original. Patient is taking Crestor 20 mg but has some compliance issues, missing it 2-3 times weekly. He says that he misses it because he can't find it and I have suggested that he use a med-minder. His lipids are at goal and I expect hi   Hyperlipidemia 05/25/2012   Hypogonadism male 09/24/2011   Left knee DJD    Lumbago 04/25/2014   Migraine headache 08/03/2012   Need for prophylactic vaccination and inoculation against influenza 05/13/2013   Obesity 04/11/2007   Last Assessment & Plan:  Formatting of this note might be different from the original. Patient has gained about 22 pounds since he quit smoking 6 months ago. He is craving food more and says that he can't "fight two devils" at once. I have made a few suggestions about how he could reduce it.   Other malaise and fatigue 11/01/2012   Prediabetes 11/26/2016   Preventative health care 09/22/2014   Prostatic hypertrophy 04/10/2010   Formatting of this note might be different from the original. Finasteride 5 mg daily  Last Assessment & Plan:  Formatting of this note might be different from the original. Patient isn't having significant symptoms on the finasteride 5 mg daily.   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 Patient is having no pulmonary symptoms since he has quit smoking.   Reduced libido 08/20/2011   Salivary gland stone 02/28/2016   Seborrheic keratoses 04/03/2018   Tobacco use disorder 10/30/2012   Type 2 diabetes mellitus (HCC) 04/10/2010    Last Assessment & Plan:  Formatting of this note might be different from the original. Patient has had IFG for years and hasn't been able to take my suggestions regarding weight loss and healthy eating. Blood work done last month shows a glucose that is now 129 and A1c done today, POCT was 6.6%. I have told the patient that given his 22 pound weight gain since smoking cessation, this isn't surpris   Unspecified essential hypertension 05/25/2012   Does not see a cardiologist    Past Surgical History:  Procedure Laterality Date   left hand surgery     PARTIAL KNEE ARTHROPLASTY Left 04/06/2013   Procedure: UNICOMPARTMENTAL LEFT KNEE;  Surgeon: Nilda Simmer, MD;  Location: [facility] OR;  Service: Orthopedics;  Laterality: Left;   UMBILICAL HERNIA REPAIR  2017   w/ small ventral ex mesh    Family History  Problem Relation Age of Onset   Alzheimer's disease Mother    Hypertension Father  Diabetes Brother        ?   Alcohol abuse Maternal Grandfather    Cancer Paternal Grandmother    Heart disease Paternal Grandfather        MI    Social History   Socioeconomic History   Marital status: Married    Spouse name: Not on file   Number of children: Not on file   Years of education: Not on file   Highest education level: Not on file  Occupational History   Not on file  Tobacco Use   Smoking status: Former    Packs/day: 1.00    Years: 15.00    Pack years: 15.00    Types: Cigarettes    Quit date: 02/2019    Years since quitting: 2.3   Smokeless tobacco: Never  Vaping Use   Vaping Use: Every day  Substance and Sexual Activity   Alcohol use: Yes    Comment: 1 beer about every 2 months    Drug use: No   Sexual activity: Yes    Birth control/protection: Condom  Other Topics Concern   Not on file  Social History Narrative   Not on file   Social Determinants of Health   Financial Resource Strain: Not on file  Food Insecurity: Not on file  Transportation Needs: Not on file   Physical Activity: Not on file  Stress: Not on file  Social Connections: Not on file  Intimate Partner Violence: Not on file    Outpatient Medications Prior to Visit  Medication Sig Dispense Refill   aspirin EC 81 MG tablet Take 1 tablet (81 mg total) by mouth daily. 90 tablet 3   busPIRone (BUSPAR) 5 MG tablet TAKE 1 TABLET BY MOUTH THREE TIMES A DAY 270 tablet 1   metFORMIN (GLUCOPHAGE) 500 MG tablet Take 1 tablet (500 mg total) by mouth 2 (two) times daily with a meal. 180 tablet 1   pantoprazole (PROTONIX) 40 MG tablet TAKE 1 CAPSULE Daily take 30 minutes before breakfast every morning     rosuvastatin (CRESTOR) 20 MG tablet TAKE 1 TABLET BY MOUTH EVERY DAY 90 tablet 3   tamsulosin (FLOMAX) 0.4 MG CAPS capsule TAKE 1 CAPSULE BY MOUTH EVERY DAY 90 capsule 1   telmisartan-hydrochlorothiazide (MICARDIS HCT) 40-12.5 MG tablet Take 1 tablet by mouth daily. 90 tablet 3   triamcinolone (KENALOG) 0.1 % Apply 1 application topically as needed. 80 g 5   No facility-administered medications prior to visit.    No Known Allergies  Review of Systems  Constitutional:  Negative for chills, fever and malaise/fatigue.  HENT:  Negative for congestion and sore throat.   Respiratory:  Negative for cough, shortness of breath and wheezing.   Cardiovascular:  Negative for chest pain, palpitations and leg swelling.  Gastrointestinal:  Negative for abdominal pain, blood in stool, constipation and diarrhea.  Musculoskeletal:  Positive for back pain. Negative for falls and neck pain.  Neurological:  Positive for weakness. Negative for dizziness and headaches.      Objective:    Physical Exam  There were no vitals taken for this visit. Wt Readings from Last 3 Encounters:  04/06/21 229 lb 3.2 oz (104 kg)  01/27/21 233 lb 6.4 oz (105.9 kg)  09/27/20 226 lb 9.6 oz (102.8 kg)    Diabetic Foot Exam - Simple   No data filed    Lab Results  Component Value Date   WBC 11.8 (H) 04/06/2021   HGB  14.6 04/06/2021   HCT  42.6 04/06/2021   PLT 230.0 04/06/2021   GLUCOSE 125 (H) 04/06/2021   CHOL 140 04/06/2021   TRIG 78.0 04/06/2021   HDL 43.90 04/06/2021   LDLCALC 81 04/06/2021   ALT 35 04/06/2021   AST 19 04/06/2021   NA 137 04/06/2021   K 4.8 04/06/2021   CL 101 04/06/2021   CREATININE 0.82 04/06/2021   BUN 14 04/06/2021   CO2 29 04/06/2021   TSH 1.19 04/06/2021   PSA 2.79 04/06/2021   INR 1.00 03/30/2013   HGBA1C 7.4 (H) 04/06/2021   MICROALBUR 0.6 01/08/2017    Lab Results  Component Value Date   TSH 1.19 04/06/2021   Lab Results  Component Value Date   WBC 11.8 (H) 04/06/2021   HGB 14.6 04/06/2021   HCT 42.6 04/06/2021   MCV 94.2 04/06/2021   PLT 230.0 04/06/2021   Lab Results  Component Value Date   NA 137 04/06/2021   K 4.8 04/06/2021   CO2 29 04/06/2021   GLUCOSE 125 (H) 04/06/2021   BUN 14 04/06/2021   CREATININE 0.82 04/06/2021   BILITOT 0.4 04/06/2021   ALKPHOS 74 04/06/2021   AST 19 04/06/2021   ALT 35 04/06/2021   PROT 7.3 04/06/2021   ALBUMIN 4.5 04/06/2021   CALCIUM 9.4 04/06/2021   GFR 88.96 04/06/2021   Lab Results  Component Value Date   CHOL 140 04/06/2021   Lab Results  Component Value Date   HDL 43.90 04/06/2021   Lab Results  Component Value Date   LDLCALC 81 04/06/2021   Lab Results  Component Value Date   TRIG 78.0 04/06/2021   Lab Results  Component Value Date   CHOLHDL 3 04/06/2021   Lab Results  Component Value Date   HGBA1C 7.4 (H) 04/06/2021       Assessment & Plan:   Problem List Items Addressed This Visit     Type 2 diabetes mellitus (HCC)    hgba1c mildly elevated, minimize simple carbs. Increase exercise as tolerated. Continue current meds. Recheck next week      Relevant Orders   Hemoglobin A1c   Hypercholesterolemia    Encourage heart healthy diet such as MIND or DASH diet, increase exercise, avoid trans fats, simple carbohydrates and processed foods, consider a krill or fish or flaxseed  oil cap daily.       Relevant Orders   CBC with Differential/Platelet   Comprehensive metabolic panel   Lipid panel   TSH   Hypertension    Monitor and no changes to meds. Encouraged heart healthy diet such as the DASH diet and exercise as tolerated.       Relevant Orders   CBC with Differential/Platelet   Comprehensive metabolic panel   Lipid panel   TSH   Gallstone    Had a second attack in 2 years about a month ago. He feels better now. Has set up an Ultrasound with his GI doc Dr Roselyn Bering next week. Avoid fatty and spicy food      Constipation    Taking Miralax and Benefiber but still having reflux and bloating.       Anxiety    Feeling much better with Buspar 5 mg addition. Usually just takes an am dose but occasionally does take a second dose. No changes will continue tid prn      Acid reflux    Was started on Pantoprazole and is doing better.       Relevant Medications   pantoprazole (PROTONIX) 40 MG  tablet   Right knee pain    Roughly 4-6 weeks ago he turned and it just felt loose and it made some noises. But no pain or swelling. Encouraged moist heat and gentle stretching as tolerated. May try Acetaminophen and prescription meds as directed and report if symptoms worsen or seek immediate care. Hurts when lying down at night. For now he wants to give it more time, he did have a  Left TKR roughly 8 years ago and that has held up.         No orders of the defined types were placed in this encounter.    I discussed the assessment and treatment plan with the patient. The patient was provided an opportunity to ask questions and all were answered. The patient agreed with the plan and demonstrated an understanding of the instructions.   The patient was advised to call back or seek an in-person evaluation if the symptoms worsen or if the condition fails to improve as anticipated.  I provided 23 minutes of non-face-to-face time during this encounter.   [name],  MD [facility] [phone] (phone) [phone] (fax)  [facility]   I, Billie Lade, acting as a scribe for [name], MD, have documented all relevent documentation on behalf of [name], MD, as directed by [name], MD while in the presence of [name], MD. DO:06/27/21.  I, Bradd Canary, MD personally performed the services described in this documentation. All medical record entries made by the scribe were at my direction and in my presence. I have reviewed the chart and agree that the record reflects my personal performance and is accurate and complete

## 2021-06-27 NOTE — Assessment & Plan Note (Signed)
hgba1c mildly elevated, minimize simple carbs. Increase exercise as tolerated. Continue current meds. Recheck next week

## 2021-06-27 NOTE — Assessment & Plan Note (Signed)
Monitor and no changes to meds. Encouraged heart healthy diet such as the DASH diet and exercise as tolerated.  

## 2021-06-27 NOTE — Assessment & Plan Note (Signed)
Taking Miralax and Benefiber but still having reflux and bloating.

## 2021-06-27 NOTE — Assessment & Plan Note (Signed)
Continues to smoke about 1 ppd if he is ready to try patches would start the 14 mcg daily. He is not quite ready to try again

## 2021-06-30 ENCOUNTER — Other Ambulatory Visit: Payer: Self-pay | Admitting: Family Medicine

## 2021-06-30 DIAGNOSIS — F32A Depression, unspecified: Secondary | ICD-10-CM

## 2021-06-30 DIAGNOSIS — E669 Obesity, unspecified: Secondary | ICD-10-CM

## 2021-06-30 DIAGNOSIS — M545 Low back pain, unspecified: Secondary | ICD-10-CM

## 2021-06-30 DIAGNOSIS — E1169 Type 2 diabetes mellitus with other specified complication: Secondary | ICD-10-CM

## 2021-06-30 DIAGNOSIS — R3914 Feeling of incomplete bladder emptying: Secondary | ICD-10-CM | POA: Diagnosis not present

## 2021-06-30 DIAGNOSIS — R972 Elevated prostate specific antigen [PSA]: Secondary | ICD-10-CM | POA: Diagnosis not present

## 2021-06-30 DIAGNOSIS — I10 Essential (primary) hypertension: Secondary | ICD-10-CM

## 2021-06-30 DIAGNOSIS — E785 Hyperlipidemia, unspecified: Secondary | ICD-10-CM

## 2021-06-30 DIAGNOSIS — F17200 Nicotine dependence, unspecified, uncomplicated: Secondary | ICD-10-CM

## 2021-07-03 DIAGNOSIS — I10 Essential (primary) hypertension: Secondary | ICD-10-CM | POA: Diagnosis not present

## 2021-07-03 DIAGNOSIS — R1011 Right upper quadrant pain: Secondary | ICD-10-CM | POA: Diagnosis not present

## 2021-07-03 DIAGNOSIS — K3189 Other diseases of stomach and duodenum: Secondary | ICD-10-CM | POA: Insufficient documentation

## 2021-07-03 DIAGNOSIS — R12 Heartburn: Secondary | ICD-10-CM | POA: Diagnosis not present

## 2021-07-03 DIAGNOSIS — E119 Type 2 diabetes mellitus without complications: Secondary | ICD-10-CM | POA: Diagnosis not present

## 2021-07-03 DIAGNOSIS — K2950 Unspecified chronic gastritis without bleeding: Secondary | ICD-10-CM | POA: Diagnosis not present

## 2021-07-04 DIAGNOSIS — K760 Fatty (change of) liver, not elsewhere classified: Secondary | ICD-10-CM | POA: Diagnosis not present

## 2021-07-04 DIAGNOSIS — K8020 Calculus of gallbladder without cholecystitis without obstruction: Secondary | ICD-10-CM | POA: Diagnosis not present

## 2021-07-04 DIAGNOSIS — R161 Splenomegaly, not elsewhere classified: Secondary | ICD-10-CM | POA: Diagnosis not present

## 2021-07-05 ENCOUNTER — Encounter: Payer: Self-pay | Admitting: Cardiology

## 2021-07-05 DIAGNOSIS — I714 Abdominal aortic aneurysm, without rupture, unspecified: Secondary | ICD-10-CM

## 2021-07-24 NOTE — Progress Notes (Signed)
Cardiology Office Note:    Date:  07/25/2021   ID:  Nathan Hess, DOB 1950/11/22, MRN [id number]  PCP:  Bradd CanaryBlyth, Stacey A, MD  Cardiologist:  Norman HerrlichBrian Tahlia Deamer, MD    Referring MD: Bradd CanaryBlyth, Stacey A, MD    ASSESSMENT:    1. Abdominal aortic aneurysm (AAA) without rupture, unspecified part   2. Coronary artery calcification seen on CT scan   3. Essential hypertension   4. Mixed hyperlipidemia   5. Diabetes mellitus type 2 in obese [facility](HCC)    PLAN:    In order of problems listed above:  Scheduled for duplex next week if stable will make next follow-up 1-1/2 years.  Fortunately his hypertension and hyperlipidemia are well controlled which tends to slow the rate of progression On appropriate therapy including aspirin high intensity statin Stable BP at target continue current treatment ARB thiazide diuretic Stable lipids at target September continue his current statin Encouraged to achieve target of 7.0   Next appointment: 1/2 years   Medication Adjustments/Labs and Tests Ordered: Current medicines are reviewed at length with the patient today.  Concerns regarding medicines are outlined above.  Orders Placed This Encounter  Procedures   EKG 12-Lead   No orders of the defined types were placed in this encounter.   Chief complaint, follow-up for abdominal aortic aneurysm History of Present Illness:    Nathan Hess is a 71 y.o. male with a hx of hypertension hyperlipidemia type 2 diabetes and a small abdominal aortic aneurysm 33 mm last seen 05/13/2020.  Lung cancer screening CT in March 2022 showed coronary artery calcification and he is on a high intensity statin.  Compliance with diet, lifestyle and medications: Yes  Unfortunately scheduled for his duplex next week.  If stable we can wait a year and a half before checking a follow-up abdominal aortic ultrasound. He has had no abdominal pain claudication chest pain edema or shortness of breath. His primary complaint  is lower extremity weakness and joint pain.  He had a chest CT lung cancer screening 10/06/2020 benign appearance with aortic atherosclerosis emphysema and probable calcified gallstones incompletely visualized coronary artery calcification was also noted. Past Medical History:  Diagnosis Date   AAA (abdominal aortic aneurysm) 10/06/2018   Anxiety 09/24/2011   Atopic dermatitis 11/26/2016   BPH (benign prostatic hyperplasia) 08/26/2019   Constipation 05/25/2019   Costochondritis 04/03/2018   Depression    Diabetes mellitus type 2 in obese (HCC) 05/25/2012   Enlarged prostate    Erectile dysfunction 06/13/2010   Formatting of this note might be different from the original. Cialis 20 mg daily prn    Last Assessment & Plan:  Formatting of this note might be different from the original. Patient has good results with Cialis 20 mg as needed, but has run out. In the past, he tried Viagra with inadequate results. He was given a refill for #9 with 11 refills.   Gallstone 10/06/2018   Headache 04/10/2010   Formatting of this note might be different from the original. Sumatriptan 100 mg prn and hydrocodone/APAP 7.5/500 mg prn  ICD-10 cut over   Last Assessment & Plan:  Formatting of this note might be different from the original. Patient is having a migraine today and was given a sample of relpax 40 mg today because he isn't carrying the sumatriptan with him. He says that he is getting very few migra   Hearing loss in left ear 01/08/2017   History of colonic polyps 06/26/2015   3rd  colonoscopy in 2016 with Dr Grayling Congress in [place]   Hypercholesterolemia 04/11/2007   Formatting of this note might be different from the original. Crestor 20 mg daily   Last Assessment & Plan:  Formatting of this note might be different from the original. Patient is taking Crestor 20 mg but has some compliance issues, missing it 2-3 times weekly. He says that he misses it because he can't find it and I have suggested that he use a med-minder.  His lipids are at goal and I expect hi   Hyperlipidemia 05/25/2012   Hypogonadism male 09/24/2011   Left knee DJD    Lumbago 04/25/2014   Migraine headache 08/03/2012   Need for prophylactic vaccination and inoculation against influenza 05/13/2013   Obesity 04/11/2007   Last Assessment & Plan:  Formatting of this note might be different from the original. Patient has gained about 22 pounds since he quit smoking 6 months ago. He is craving food more and says that he can't "fight two devils" at once. I have made a few suggestions about how he could reduce it.   Other malaise and fatigue 11/01/2012   Prediabetes 11/26/2016   Preventative health care 09/22/2014   Prostatic hypertrophy 04/10/2010   Formatting of this note might be different from the original. Finasteride 5 mg daily  Last Assessment & Plan:  Formatting of this note might be different from the original. Patient isn't having significant symptoms on the finasteride 5 mg daily.   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 Patient is having no pulmonary symptoms since he has quit smoking.   Reduced libido 08/20/2011   Salivary gland stone 02/28/2016   Seborrheic keratoses 04/03/2018   Tobacco use disorder 10/30/2012   Type 2 diabetes mellitus (HCC) 04/10/2010   Last Assessment & Plan:  Formatting of this note might be different from the original. Patient has had IFG for years and hasn't been able to take my suggestions regarding weight loss and healthy eating. Blood work done last month shows a glucose that is now 129 and A1c done today, POCT was 6.6%. I have told the patient that given his 22 pound weight gain since smoking cessation, this isn't surpris   Unspecified essential hypertension 05/25/2012   Does not see a cardiologist    Past Surgical History:  Procedure Laterality Date   left hand surgery     PARTIAL KNEE ARTHROPLASTY Left 04/06/2013    Procedure: UNICOMPARTMENTAL LEFT KNEE;  Surgeon: Nilda Simmer, MD;  Location: [facility] OR;  Service: Orthopedics;  Laterality: Left;   UMBILICAL HERNIA REPAIR  2017   w/ small ventral ex mesh    Current Medications: Current Meds  Medication Sig   aspirin EC 81 MG tablet Take 1 tablet (81 mg total) by mouth daily.   busPIRone (BUSPAR) 5 MG tablet TAKE 1 TABLET BY MOUTH THREE TIMES A DAY   metFORMIN (GLUCOPHAGE) 500 MG tablet Take 1 tablet (500 mg total) by mouth 2 (two) times daily with a meal.   pantoprazole (PROTONIX) 40 MG tablet TAKE 1 CAPSULE Daily take 30 minutes before breakfast every morning   rosuvastatin (CRESTOR) 20 MG tablet TAKE 1 TABLET BY MOUTH EVERY DAY   tamsulosin (FLOMAX) 0.4 MG CAPS capsule TAKE 1 CAPSULE BY MOUTH EVERY DAY   telmisartan-hydrochlorothiazide (MICARDIS HCT) 40-12.5 MG tablet Take 1 tablet by mouth daily.   triamcinolone (KENALOG) 0.1 % Apply 1 application  topically as needed.     Allergies:   Patient has no known allergies.   Social History   Socioeconomic History   Marital status: Married    Spouse name: Not on file   Number of children: Not on file   Years of education: Not on file   Highest education level: Not on file  Occupational History   Not on file  Tobacco Use   Smoking status: Former    Packs/day: 1.00    Years: 15.00    Pack years: 15.00    Types: Cigarettes    Quit date: 02/2019    Years since quitting: 2.4   Smokeless tobacco: Never  Vaping Use   Vaping Use: Every day  Substance and Sexual Activity   Alcohol use: Yes    Comment: 1 beer about every 2 months    Drug use: No   Sexual activity: Yes    Birth control/protection: Condom  Other Topics Concern   Not on file  Social History Narrative   Not on file   Social Determinants of Health   Financial Resource Strain: Not on file  Food Insecurity: Not on file  Transportation Needs: Not on file  Physical Activity: Not on file  Stress: Not on file  Social Connections:  Not on file     Family History: The patient's family history includes Alcohol abuse in his maternal grandfather; Alzheimer's disease in his mother; Cancer in his paternal grandmother; Diabetes in his brother; Heart disease in his paternal grandfather; Hypertension in his father. ROS:   Please see the history of present illness.    All other systems reviewed and are negative.  EKGs/Labs/Other Studies Reviewed:    The following studies were reviewed today:  EKG:  EKG ordered today and personally reviewed.  The ekg ordered today demonstrates sinus rhythm remains normal  Recent Labs: 04/06/2021: ALT 35; BUN 14; Creatinine, Ser 0.82; Hemoglobin 14.6; Platelets 230.0; Potassium 4.8; Sodium 137; TSH 1.19  Recent Lipid Panel    Component Value Date/Time   CHOL 140 04/06/2021 0939   TRIG 78.0 04/06/2021 0939   HDL 43.90 04/06/2021 0939   CHOLHDL 3 04/06/2021 0939   VLDL 15.6 04/06/2021 0939   LDLCALC 81 04/06/2021 0939    Physical Exam:    VS:  BP 126/77 (BP Location: Right Arm, Patient Position: Sitting, Cuff Size: Normal)    Pulse 73    Ht 5\' 11"  (1.803 m)    Wt 230 lb (104.3 kg)    SpO2 97%    BMI 32.08 kg/m     Wt Readings from Last 3 Encounters:  07/25/21 230 lb (104.3 kg)  04/06/21 229 lb 3.2 oz (104 kg)  01/27/21 233 lb 6.4 oz (105.9 kg)     GEN: Looks his age well nourished, well developed in no acute distress HEENT: Normal NECK: No JVD; No carotid bruits LYMPHATICS: No lymphadenopathy CARDIAC: RRR, no murmurs, rubs, gallops RESPIRATORY:  Clear to auscultation without rales, wheezing or rhonchi  ABDOMEN: Soft, non-tender, non-distended MUSCULOSKELETAL:  No edema; No deformity  SKIN: Warm and dry NEUROLOGIC:  Alert and oriented x 3 PSYCHIATRIC:  Normal affect    Signed, 03/30/21, MD  07/25/2021 9:29 AM    [facility]

## 2021-07-25 ENCOUNTER — Ambulatory Visit: Payer: BC Managed Care – PPO | Admitting: Cardiology

## 2021-07-25 ENCOUNTER — Encounter: Payer: Self-pay | Admitting: Cardiology

## 2021-07-25 ENCOUNTER — Other Ambulatory Visit: Payer: Self-pay

## 2021-07-25 VITALS — BP 126/77 | HR 73 | Ht 71.0 in | Wt 230.0 lb

## 2021-07-25 DIAGNOSIS — E1169 Type 2 diabetes mellitus with other specified complication: Secondary | ICD-10-CM

## 2021-07-25 DIAGNOSIS — I10 Essential (primary) hypertension: Secondary | ICD-10-CM

## 2021-07-25 DIAGNOSIS — I2510 Atherosclerotic heart disease of native coronary artery without angina pectoris: Secondary | ICD-10-CM

## 2021-07-25 DIAGNOSIS — E782 Mixed hyperlipidemia: Secondary | ICD-10-CM

## 2021-07-25 DIAGNOSIS — I714 Abdominal aortic aneurysm, without rupture, unspecified: Secondary | ICD-10-CM | POA: Diagnosis not present

## 2021-07-25 DIAGNOSIS — E669 Obesity, unspecified: Secondary | ICD-10-CM

## 2021-07-25 NOTE — Patient Instructions (Signed)
Medication Instructions:  Your physician recommends that you continue on your current medications as directed. Please refer to the Current Medication list given to you today.  *If you need a refill on your cardiac medications before your next appointment, please call your pharmacy*   Lab Work: None If you have labs (blood work) drawn today and your tests are completely normal, you will receive your results only by: MyChart Message (if you have MyChart) OR A paper copy in the mail If you have any lab test that is abnormal or we need to change your treatment, we will call you to review the results.   Testing/Procedures: None   Follow-Up: At [facility], you and your health needs are our priority.  As part of our continuing mission to provide you with exceptional heart care, we have created designated Provider Care Teams.  These Care Teams include your primary Cardiologist (physician) and Advanced Practice Providers (APPs -  Physician Assistants and Nurse Practitioners) who all work together to provide you with the care you need, when you need it.  We recommend signing up for the patient portal called "MyChart".  Sign up information is provided on this After Visit Summary.  MyChart is used to connect with patients for Virtual Visits (Telemedicine).  Patients are able to view lab/test results, encounter notes, upcoming appointments, etc.  Non-urgent messages can be sent to your provider as well.   To learn more about what you can do with MyChart, go to [email or web address].    Your next appointment:   1 1/2 year(s)  The format for your next appointment:   In Person  Provider:   Norman Herrlich, MD    Other Instructions

## 2021-07-31 ENCOUNTER — Ambulatory Visit (HOSPITAL_COMMUNITY)
Admission: RE | Admit: 2021-07-31 | Discharge: 2021-07-31 | Disposition: A | Discharge status: Home / Self Care | Payer: BC Managed Care – PPO | Source: Ambulatory Visit | Attending: Cardiovascular Disease | Admitting: Cardiovascular Disease

## 2021-07-31 ENCOUNTER — Other Ambulatory Visit: Payer: Self-pay

## 2021-07-31 DIAGNOSIS — I714 Abdominal aortic aneurysm, without rupture, unspecified: Secondary | ICD-10-CM | POA: Diagnosis not present

## 2021-07-31 DIAGNOSIS — I7143 Infrarenal abdominal aortic aneurysm, without rupture: Secondary | ICD-10-CM | POA: Diagnosis not present

## 2021-08-01 ENCOUNTER — Telehealth: Payer: Self-pay

## 2021-08-01 NOTE — Telephone Encounter (Signed)
-----   Message from Baldo Daub, MD sent at 08/01/2021  8:19 AM EST ----- There has been moderate progression over the last 1-1/2 years I would like to repeat duplex in 6 months he does not require referral to CT surgery and typically intervention is associated with diameter greater than 50 mm presently he has 37

## 2021-08-01 NOTE — Telephone Encounter (Signed)
Spoke with patient regarding results and recommendation.  Patient verbalizes understanding and is agreeable to plan of care. Advised patient to call back with any issues or concerns.  

## 2021-10-23 NOTE — Progress Notes (Deleted)
? ?Subjective:  ? ? Patient ID: Nathan Hess, male    DOB: 1951-02-15, 71 y.o.   MRN: [id number] ? ?No chief complaint on file. ? ? ?HPI ?Patient is in today for his annual physical exam. ? ?Past Medical History:  ?Diagnosis Date  ? AAA (abdominal aortic aneurysm) 10/06/2018  ? Anxiety 09/24/2011  ? Atopic dermatitis 11/26/2016  ? BPH (benign prostatic hyperplasia) 08/26/2019  ? Constipation 05/25/2019  ? Costochondritis 04/03/2018  ? Depression   ? Diabetes mellitus type 2 in obese (HCC) 05/25/2012  ? Enlarged prostate   ? Erectile dysfunction 06/13/2010  ? Formatting of this note might be different from the original. Cialis 20 mg daily prn    Last Assessment & Plan:  Formatting of this note might be different from the original. Patient has good results with Cialis 20 mg as needed, but has run out. In the past, he tried Viagra with inadequate results. He was given a refill for #9 with 11 refills.  ? Gallstone 10/06/2018  ? Headache 04/10/2010  ? Formatting of this note might be different from the original. Sumatriptan 100 mg prn and hydrocodone/APAP 7.5/500 mg prn  ICD-10 cut over   Last Assessment & Plan:  Formatting of this note might be different from the original. Patient is having a migraine today and was given a sample of relpax 40 mg today because he isn't carrying the sumatriptan with him. He says that he is getting very few migra  ? Hearing loss in left ear 01/08/2017  ? History of colonic polyps 06/26/2015  ? 3rd colonoscopy in 2016 with Dr Grayling Congress[place]pari in [place]  ? Hypercholesterolemia 04/11/2007  ? Formatting of this note might be different from the original. Crestor 20 mg daily   Last Assessment & Plan:  Formatting of this note might be different from the original. Patient is taking Crestor 20 mg but has some compliance issues, missing it 2-3 times weekly. He says that he misses it because he can't find it and I have suggested that he use a med-minder. His lipids are at goal and I expect hi  ? Hyperlipidemia  05/25/2012  ? Hypogonadism male 09/24/2011  ? Left knee DJD   ? Lumbago 04/25/2014  ? Migraine headache 08/03/2012  ? Need for prophylactic vaccination and inoculation against influenza 05/13/2013  ? Obesity 04/11/2007  ? Last Assessment & Plan:  Formatting of this note might be different from the original. Patient has gained about 22 pounds since he quit smoking 6 months ago. He is craving food more and says that he can't "fight two devils" at once. I have made a few suggestions about how he could reduce it.  ? Other malaise and fatigue 11/01/2012  ? Prediabetes 11/26/2016  ? Preventative health care 09/22/2014  ? Prostatic hypertrophy 04/10/2010  ? Formatting of this note might be different from the original. Finasteride 5 mg daily  Last Assessment & Plan:  Formatting of this note might be different from the original. Patient isn't having significant symptoms on the finasteride 5 mg daily.  ? 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 Last Assessment & Plan:  Formatting of this note might be different from the original. Patient is having no pulmonary symptoms since he has quit smoking.  ? Reduced libido 08/20/2011  ? Salivary gland stone 02/28/2016  ? Seborrheic keratoses 04/03/2018  ? Tobacco use disorder 10/30/2012  ? Type 2 diabetes mellitus (HCC) 04/10/2010  ? Last Assessment & Plan:  Formatting of this note  might be different from the original. Patient has had IFG for years and hasn't been able to take my suggestions regarding weight loss and healthy eating. Blood work done last month shows a glucose that is now 129 and A1c done today, POCT was 6.6%. I have told the patient that given his 22 pound weight gain since smoking cessation, this isn't surpris  ? Unspecified essential hypertension 05/25/2012  ? Does not see a cardiologist  ? ? ?Past Surgical History:  ?Procedure Laterality Date  ? left hand surgery    ? PARTIAL KNEE ARTHROPLASTY Left 04/06/2013  ? Procedure: UNICOMPARTMENTAL LEFT KNEE;  Surgeon: Nilda Simmer, MD;  Location: [facility] OR;  Service: Orthopedics;  Laterality: Left;  ? UMBILICAL HERNIA REPAIR  2017  ? w/ small ventral ex mesh  ? ? ?Family History  ?Problem Relation Age of Onset  ? Alzheimer's disease Mother   ? Hypertension Father   ? Diabetes Brother   ?     ?  ? Alcohol abuse Maternal Grandfather   ? Cancer Paternal Grandmother   ? Heart disease Paternal Grandfather   ?     MI  ? ? ?Social History  ? ?Socioeconomic History  ? Marital status: Married  ?  Spouse name: Not on file  ? Number of children: Not on file  ? Years of education: Not on file  ? Highest education level: Not on file  ?Occupational History  ? Not on file  ?Tobacco Use  ? Smoking status: Former  ?  Packs/day: 1.00  ?  Years: 15.00  ?  Pack years: 15.00  ?  Types: Cigarettes  ?  Quit date: 02/2019  ?  Years since quitting: 2.6  ? Smokeless tobacco: Never  ?Vaping Use  ? Vaping Use: Every day  ?Substance and Sexual Activity  ? Alcohol use: Yes  ?  Comment: 1 beer about every 2 months   ? Drug use: No  ? Sexual activity: Yes  ?  Birth control/protection: Condom  ?Other Topics Concern  ? Not on file  ?Social History Narrative  ? Not on file  ? ?Social Determinants of Health  ? ?Financial Resource Strain: Not on file  ?Food Insecurity: Not on file  ?Transportation Needs: Not on file  ?Physical Activity: Not on file  ?Stress: Not on file  ?Social Connections: Not on file  ?Intimate Partner Violence: Not on file  ? ? ?Outpatient Medications Prior to Visit  ?Medication Sig Dispense Refill  ? aspirin EC 81 MG tablet Take 1 tablet (81 mg total) by mouth daily. 90 tablet 3  ? busPIRone (BUSPAR) 5 MG tablet TAKE 1 TABLET BY MOUTH THREE TIMES A DAY 270 tablet 1  ? metFORMIN (GLUCOPHAGE) 500 MG tablet Take 1 tablet (500 mg total) by mouth 2 (two) times daily with a meal. 180 tablet 1  ? pantoprazole (PROTONIX) 40 MG tablet TAKE 1 CAPSULE Daily take 30 minutes before breakfast every morning    ? rosuvastatin (CRESTOR) 20 MG tablet TAKE 1 TABLET BY  MOUTH EVERY DAY 90 tablet 3  ? tamsulosin (FLOMAX) 0.4 MG CAPS capsule TAKE 1 CAPSULE BY MOUTH EVERY DAY 90 capsule 1  ? telmisartan-hydrochlorothiazide (MICARDIS HCT) 40-12.5 MG tablet Take 1 tablet by mouth daily. 90 tablet 3  ? triamcinolone (KENALOG) 0.1 % Apply 1 application topically as needed. 80 g 5  ? ?No facility-administered medications prior to visit.  ? ? ?No Known Allergies ? ?ROS ? ?   ?Objective:  ?  ?  Physical Exam ? ?There were no vitals taken for this visit. ?Wt Readings from Last 3 Encounters:  ?07/25/21 230 lb (104.3 kg)  ?04/06/21 229 lb 3.2 oz (104 kg)  ?01/27/21 233 lb 6.4 oz (105.9 kg)  ? ? ?Diabetic Foot Exam - Simple   ?No data filed ?  ? ?Lab Results  ?Component Value Date  ? WBC 11.8 (H) 04/06/2021  ? HGB 14.6 04/06/2021  ? HCT 42.6 04/06/2021  ? PLT 230.0 04/06/2021  ? GLUCOSE 125 (H) 04/06/2021  ? CHOL 140 04/06/2021  ? TRIG 78.0 04/06/2021  ? HDL 43.90 04/06/2021  ? LDLCALC 81 04/06/2021  ? ALT 35 04/06/2021  ? AST 19 04/06/2021  ? NA 137 04/06/2021  ? K 4.8 04/06/2021  ? CL 101 04/06/2021  ? CREATININE 0.82 04/06/2021  ? BUN 14 04/06/2021  ? CO2 29 04/06/2021  ? TSH 1.19 04/06/2021  ? PSA 2.79 04/06/2021  ? INR 1.00 03/30/2013  ? HGBA1C 7.4 (H) 04/06/2021  ? MICROALBUR 0.6 01/08/2017  ? ? ?Lab Results  ?Component Value Date  ? TSH 1.19 04/06/2021  ? ?Lab Results  ?Component Value Date  ? WBC 11.8 (H) 04/06/2021  ? HGB 14.6 04/06/2021  ? HCT 42.6 04/06/2021  ? MCV 94.2 04/06/2021  ? PLT 230.0 04/06/2021  ? ?Lab Results  ?Component Value Date  ? NA 137 04/06/2021  ? K 4.8 04/06/2021  ? CO2 29 04/06/2021  ? GLUCOSE 125 (H) 04/06/2021  ? BUN 14 04/06/2021  ? CREATININE 0.82 04/06/2021  ? BILITOT 0.4 04/06/2021  ? ALKPHOS 74 04/06/2021  ? AST 19 04/06/2021  ? ALT 35 04/06/2021  ? PROT 7.3 04/06/2021  ? ALBUMIN 4.5 04/06/2021  ? CALCIUM 9.4 04/06/2021  ? GFR 88.96 04/06/2021  ? ?Lab Results  ?Component Value Date  ? CHOL 140 04/06/2021  ? ?Lab Results  ?Component Value Date  ? HDL 43.90  04/06/2021  ? ?Lab Results  ?Component Value Date  ? LDLCALC 81 04/06/2021  ? ?Lab Results  ?Component Value Date  ? TRIG 78.0 04/06/2021  ? ?Lab Results  ?Component Value Date  ? CHOLHDL 3 04/06/2021  ? ?Lab Re

## 2021-10-24 ENCOUNTER — Ambulatory Visit (INDEPENDENT_AMBULATORY_CARE_PROVIDER_SITE_OTHER): Payer: BC Managed Care – PPO | Admitting: Family Medicine

## 2021-10-24 ENCOUNTER — Encounter: Payer: Self-pay | Admitting: Family Medicine

## 2021-10-24 VITALS — BP 130/80 | HR 72 | Resp 20 | Ht 71.0 in | Wt 226.2 lb

## 2021-10-24 DIAGNOSIS — I10 Essential (primary) hypertension: Secondary | ICD-10-CM

## 2021-10-24 DIAGNOSIS — K760 Fatty (change of) liver, not elsewhere classified: Secondary | ICD-10-CM

## 2021-10-24 DIAGNOSIS — E1165 Type 2 diabetes mellitus with hyperglycemia: Secondary | ICD-10-CM

## 2021-10-24 DIAGNOSIS — I714 Abdominal aortic aneurysm, without rupture, unspecified: Secondary | ICD-10-CM

## 2021-10-24 DIAGNOSIS — F17200 Nicotine dependence, unspecified, uncomplicated: Secondary | ICD-10-CM

## 2021-10-24 DIAGNOSIS — E7800 Pure hypercholesterolemia, unspecified: Secondary | ICD-10-CM

## 2021-10-24 DIAGNOSIS — Z0000 Encounter for general adult medical examination without abnormal findings: Secondary | ICD-10-CM

## 2021-10-24 DIAGNOSIS — K219 Gastro-esophageal reflux disease without esophagitis: Secondary | ICD-10-CM

## 2021-10-24 DIAGNOSIS — K8020 Calculus of gallbladder without cholecystitis without obstruction: Secondary | ICD-10-CM

## 2021-10-24 HISTORY — DX: Fatty (change of) liver, not elsewhere classified: K76.0

## 2021-10-24 LAB — CBC WITH DIFFERENTIAL/PLATELET
Basophils Absolute: 0.1 10*3/uL (ref 0.0–0.1)
Basophils Relative: 0.8 % (ref 0.0–3.0)
Eosinophils Absolute: 0.1 10*3/uL (ref 0.0–0.7)
Eosinophils Relative: 1.8 % (ref 0.0–5.0)
HCT: 41.1 % (ref 39.0–52.0)
Hemoglobin: 14.4 g/dL (ref 13.0–17.0)
Lymphocytes Relative: 27 % (ref 12.0–46.0)
Lymphs Abs: 1.9 10*3/uL (ref 0.7–4.0)
MCHC: 35 g/dL (ref 30.0–36.0)
MCV: 93.2 fl (ref 78.0–100.0)
Monocytes Absolute: 0.6 10*3/uL (ref 0.1–1.0)
Monocytes Relative: 9 % (ref 3.0–12.0)
Neutro Abs: 4.2 10*3/uL (ref 1.4–7.7)
Neutrophils Relative %: 61.4 % (ref 43.0–77.0)
Platelets: 198 10*3/uL (ref 150.0–400.0)
RBC: 4.42 Mil/uL (ref 4.22–5.81)
RDW: 13.1 % (ref 11.5–15.5)
WBC: 6.9 10*3/uL (ref 4.0–10.5)

## 2021-10-24 LAB — LIPID PANEL
Cholesterol: 141 mg/dL (ref 0–200)
HDL: 43.3 mg/dL (ref >39.00)
LDL Cholesterol: 82 mg/dL (ref 0–99)
NonHDL: 97.64
Total CHOL/HDL Ratio: 3
Triglycerides: 78 mg/dL (ref 0.0–149.0)
VLDL: 15.6 mg/dL (ref 0.0–40.0)

## 2021-10-24 LAB — COMPREHENSIVE METABOLIC PANEL
ALT: 37 U/L (ref 0–53)
AST: 22 U/L (ref 0–37)
Albumin: 4.7 g/dL (ref 3.5–5.2)
Alkaline Phosphatase: 62 U/L (ref 39–117)
BUN: 15 mg/dL (ref 6–23)
CO2: 29 mEq/L (ref 19–32)
Calcium: 9.2 mg/dL (ref 8.4–10.5)
Chloride: 101 mEq/L (ref 96–112)
Creatinine, Ser: 0.86 mg/dL (ref 0.40–1.50)
GFR: 87.35 mL/min (ref >60.00)
Glucose, Bld: 132 mg/dL — ABNORMAL HIGH (ref 70–99)
Potassium: 4.2 mEq/L (ref 3.5–5.1)
Sodium: 138 mEq/L (ref 135–145)
Total Bilirubin: 0.5 mg/dL (ref 0.2–1.2)
Total Protein: 7.2 g/dL (ref 6.0–8.3)

## 2021-10-24 LAB — HEMOGLOBIN A1C: Hgb A1c MFr Bld: 7.9 % — ABNORMAL HIGH (ref 4.6–6.5)

## 2021-10-24 LAB — MICROALBUMIN / CREATININE URINE RATIO
Creatinine,U: 77.2 mg/dL
Microalb Creat Ratio: 0.9 mg/g (ref 0.0–30.0)
Microalb, Ur: <0.7 mg/dL (ref 0.0–1.9)

## 2021-10-24 LAB — TSH: TSH: 1.94 u[IU]/mL (ref 0.35–5.50)

## 2021-10-24 NOTE — Assessment & Plan Note (Signed)
Well controlled, no changes to meds. Encouraged heart healthy diet such as the DASH diet and exercise as tolerated.  °

## 2021-10-24 NOTE — Assessment & Plan Note (Signed)
hgba1c acceptable, minimize simple carbs. Increase exercise as tolerated. Continue current meds. He reports his last hgba1c at work was 6.8 

## 2021-10-24 NOTE — Assessment & Plan Note (Signed)
Had a repeat ultrasound recently for recurrent RUQ pain, stones still present but no sign of blockage or infection and pain has now resolved so no further work up presently ?

## 2021-10-24 NOTE — Assessment & Plan Note (Signed)
Patient encouraged to maintain heart healthy diet, regular exercise, adequate sleep. Consider daily probiotics. Take medications as prescribed. Labs ordered and reviewed. Reports he had a colonoscopy about 6 months ago in [place]. Will request records. Reviewed immunizations. Consider a boost in Prevnar 13 ?

## 2021-10-24 NOTE — Assessment & Plan Note (Signed)
Tolerating statin, encouraged heart healthy diet, avoid trans fats, minimize simple carbs and saturated fats. Increase exercise as tolerated 

## 2021-10-24 NOTE — Patient Instructions (Signed)
Probiotic to minimize bloating, try the [place], multistrain ? ?Preventive Care 63 Years and Older, Male ?Preventive care refers to lifestyle choices and visits with your health care provider that can promote health and wellness. Preventive care visits are also called wellness exams. ?What can I expect for my preventive care visit? ?Counseling ?During your preventive care visit, your health care provider may ask about your: ?Medical history, including: ?Past medical problems. ?Family medical history. ?History of falls. ?Current health, including: ?Emotional well-being. ?Home life and relationship well-being. ?Sexual activity. ?Memory and ability to understand (cognition). ?Lifestyle, including: ?Alcohol, nicotine or tobacco, and drug use. ?Access to firearms. ?Diet, exercise, and sleep habits. ?Work and work Statistician. ?Sunscreen use. ?Safety issues such as seatbelt and bike helmet use. ?Physical exam ?Your health care provider will check your: ?Height and weight. These may be used to calculate your BMI (body mass index). BMI is a measurement that tells if you are at a healthy weight. ?Waist circumference. This measures the distance around your waistline. This measurement also tells if you are at a healthy weight and may help predict your risk of certain diseases, such as type 2 diabetes and high blood pressure. ?Heart rate and blood pressure. ?Body temperature. ?Skin for abnormal spots. ?What immunizations do I need? ?Vaccines are usually given at various ages, according to a schedule. Your health care provider will recommend vaccines for you based on your age, medical history, and lifestyle or other factors, such as travel or where you work. ?What tests do I need? ?Screening ?Your health care provider may recommend screening tests for certain conditions. This may include: ?Lipid and cholesterol levels. ?Diabetes screening. This is done by checking your blood sugar (glucose) after you have not eaten for a  while (fasting). ?Hepatitis C test. ?Hepatitis B test. ?HIV (human immunodeficiency virus) test. ?STI (sexually transmitted infection) testing, if you are at risk. ?Lung cancer screening. ?Colorectal cancer screening. ?Prostate cancer screening. ?Abdominal aortic aneurysm (AAA) screening. You may need this if you are a current or former smoker. ?Talk with your health care provider about your test results, treatment options, and if necessary, the need for more tests. ?Follow these instructions at home: ?Eating and drinking ? ?Eat a diet that includes fresh fruits and vegetables, whole grains, lean protein, and low-fat dairy products. Limit your intake of foods with high amounts of sugar, saturated fats, and salt. ?Take vitamin and mineral supplements as recommended by your health care provider. ?Do not drink alcohol if your health care provider tells you not to drink. ?If you drink alcohol: ?Limit how much you have to 0-2 drinks a day. ?Know how much alcohol is in your drink. In the [place] one drink equals one 12 oz bottle of beer (355 mL), one 5 oz glass of wine (148 mL), or one 1? oz glass of hard liquor (44 mL). ?Lifestyle ?Brush your teeth every morning and night with fluoride toothpaste. Floss one time each day. ?Exercise for at least 30 minutes 5 or more days each week. ?Do not use any products that contain nicotine or tobacco. These products include cigarettes, chewing tobacco, and vaping devices, such as e-cigarettes. If you need help quitting, ask your health care provider. ?Do not use drugs. ?If you are sexually active, practice safe sex. Use a condom or other form of protection to prevent STIs. ?Take aspirin only as told by your health care provider. Make sure that you understand how much to take and what form to take. Work with your health  care provider to find out whether it is safe and beneficial for you to take aspirin daily. ?Ask your health care provider if you need to take a cholesterol-lowering  medicine (statin). ?Find healthy ways to manage stress, such as: ?Meditation, yoga, or listening to music. ?Journaling. ?Talking to a trusted person. ?Spending time with friends and family. ?Safety ?Always wear your seat belt while driving or riding in a vehicle. ?Do not drive: ?If you have been drinking alcohol. Do not ride with someone who has been drinking. ?When you are tired or distracted. ?While texting. ?If you have been using any mind-altering substances or drugs. ?Wear a helmet and other protective equipment during sports activities. ?If you have firearms in your house, make sure you follow all gun safety procedures. ?Minimize exposure to UV radiation to reduce your risk of skin cancer. ?What's next? ?Visit your health care provider once a year for an annual wellness visit. ?Ask your health care provider how often you should have your eyes and teeth checked. ?Stay up to date on all vaccines. ?This information is not intended to replace advice given to you by your health care provider. Make sure you discuss any questions you have with your health care provider. ?Document Revised: 01/04/2021 Document Reviewed: 01/04/2021 ?Elsevier Patient Education ? [place]. ? ?

## 2021-10-24 NOTE — Assessment & Plan Note (Signed)
Enlarged from 3.2 to 3.7 so he has another ultrasound in 6 months. Encouraged complete cessation ?

## 2021-10-24 NOTE — Assessment & Plan Note (Signed)
Continues to smoke 1 ppd, given several tactics for cutting back and then stopping. 

## 2021-10-24 NOTE — Assessment & Plan Note (Signed)
Minimimize simple carbs, increase exercise, eat lots of fresh fruits and veg. Increase water intake ?

## 2021-10-24 NOTE — Progress Notes (Signed)
? ?Subjective:  ? ?By signing my name below, I, Nathan Hess, attest that this documentation has been prepared under the direction and in the presence of Debora Stockdale MD, 10/24/2021 ? ? Patient ID: Nathan Hess, male    DOB: Apr 04, 1951, 71 y.o.   MRN: [id number] ? ?Chief Complaint  ?Patient presents with  ? Annual Exam  ? ? ?HPI ?Patient is in today for an annual physical exam and a follow - up visit. ? ?He reports no recent febrile illness or hospitalizations. ? ?He visited Ms. Swearingen for gallbladder pain. It was decided not to proceed with surgery.  ? ?Patient inquires about frequency of aneurysms. He states that he smokes about a pack a day.  ? ?He takes 40 MG of Protonix daily. He states that his stomach pain is resolved. He does not take probiotics. ? ?He reports that he has went to a wellness screening recently, his A1C was reported to be 6.8. He is attempting to eat a healthier diets.  ?Lab Results  ?Component Value Date  ? HGBA1C 7.4 (H) 04/06/2021  ? ? ?He denies having any fever, ear pain, new muscle pain, joint pain, new moles, congestion, sinus pain, sore throat, palpations, wheezing, n/v/d, constipation, blood in stool, dysuria, frequency, hematuria at this time. ? ?He reports no changes to family history.  ?He states that he has received his colonoscopy about 6 months ago. He reports no complications with the procedure. We are requesting to receive his colonoscopy report for confirmation.  ?PSA was last completed on 04/06/2021 ?He reports that he has received the pneumonia and influenzae vaccine in 03/2021. We are requesting to receive his immunization records for confirmation.  ? ? ? ?Past Medical History:  ?Diagnosis Date  ? AAA (abdominal aortic aneurysm) (HCC) 10/06/2018  ? Anxiety 09/24/2011  ? Atopic dermatitis 11/26/2016  ? BPH (benign prostatic hyperplasia) 08/26/2019  ? Constipation 05/25/2019  ? Costochondritis 04/03/2018  ? Depression   ? Diabetes mellitus type 2 in obese (HCC) 05/25/2012   ? Enlarged prostate   ? Erectile dysfunction 06/13/2010  ? Formatting of this note might be different from the original. Cialis 20 mg daily prn    Last Assessment & Plan:  Formatting of this note might be different from the original. Patient has good results with Cialis 20 mg as needed, but has run out. In the past, he tried Viagra with inadequate results. He was given a refill for #9 with 11 refills.  ? Gallstone 10/06/2018  ? Headache 04/10/2010  ? Formatting of this note might be different from the original. Sumatriptan 100 mg prn and hydrocodone/APAP 7.5/500 mg prn  ICD-10 cut over   Last Assessment & Plan:  Formatting of this note might be different from the original. Patient is having a migraine today and was given a sample of relpax 40 mg today because he isn't carrying the sumatriptan with him. He says that he is getting very few migra  ? Hearing loss in left ear 01/08/2017  ? History of colonic polyps 06/26/2015  ? 3rd colonoscopy in 2016 with Dr Grayling Congress in [place]  ? Hypercholesterolemia 04/11/2007  ? Formatting of this note might be different from the original. Crestor 20 mg daily   Last Assessment & Plan:  Formatting of this note might be different from the original. Patient is taking Crestor 20 mg but has some compliance issues, missing it 2-3 times weekly. He says that he misses it because he can't find it and I have suggested that  he use a med-minder. His lipids are at goal and I expect hi  ? Hyperlipidemia 05/25/2012  ? Hypogonadism male 09/24/2011  ? Left knee DJD   ? Lumbago 04/25/2014  ? Migraine headache 08/03/2012  ? Need for prophylactic vaccination and inoculation against influenza 05/13/2013  ? Obesity 04/11/2007  ? Last Assessment & Plan:  Formatting of this note might be different from the original. Patient has gained about 22 pounds since he quit smoking 6 months ago. He is craving food more and says that he can't "fight two devils" at once. I have made a few suggestions about how he could reduce  it.  ? Other malaise and fatigue 11/01/2012  ? Prediabetes 11/26/2016  ? Preventative health care 09/22/2014  ? Prostatic hypertrophy 04/10/2010  ? Formatting of this note might be different from the original. Finasteride 5 mg daily  Last Assessment & Plan:  Formatting of this note might be different from the original. Patient isn't having significant symptoms on the finasteride 5 mg daily.  ? 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 Last Assessment & Plan:  Formatting of this note might be different from the original. Patient is having no pulmonary symptoms since he has quit smoking.  ? Reduced libido 08/20/2011  ? Salivary gland stone 02/28/2016  ? Seborrheic keratoses 04/03/2018  ? Tobacco use disorder 10/30/2012  ? Type 2 diabetes mellitus (HCC) 04/10/2010  ? Last Assessment & Plan:  Formatting of this note might be different from the original. Patient has had IFG for years and hasn't been able to take my suggestions regarding weight loss and healthy eating. Blood work done last month shows a glucose that is now 129 and A1c done today, POCT was 6.6%. I have told the patient that given his 22 pound weight gain since smoking cessation, this isn't surpris  ? Unspecified essential hypertension 05/25/2012  ? Does not see a cardiologist  ? ? ?Past Surgical History:  ?Procedure Laterality Date  ? left hand surgery    ? PARTIAL KNEE ARTHROPLASTY Left 04/06/2013  ? Procedure: UNICOMPARTMENTAL LEFT KNEE;  Surgeon: Nilda Simmer, MD;  Location: [facility] OR;  Service: Orthopedics;  Laterality: Left;  ? UMBILICAL HERNIA REPAIR  2017  ? w/ small ventral ex mesh  ? ? ?Family History  ?Problem Relation Age of Onset  ? Alzheimer's disease Mother   ? Hypertension Father   ? Diabetes Brother   ?     ?  ? Alcohol abuse Maternal Grandfather   ? Cancer Paternal Grandmother   ? Heart disease Paternal Grandfather   ?     MI  ? ? ?Social History  ? ?Socioeconomic History  ? Marital status: Married  ?  Spouse name: Not on file  ? Number of  children: Not on file  ? Years of education: Not on file  ? Highest education level: Not on file  ?Occupational History  ? Not on file  ?Tobacco Use  ? Smoking status: Former  ?  Packs/day: 1.00  ?  Years: 15.00  ?  Pack years: 15.00  ?  Types: Cigarettes  ?  Quit date: 02/2019  ?  Years since quitting: 2.6  ? Smokeless tobacco: Never  ?Vaping Use  ? Vaping Use: Every day  ?Substance and Sexual Activity  ? Alcohol use: Yes  ?  Comment: 1 beer about every 2 months   ? Drug use: No  ? Sexual activity: Yes  ?  Birth control/protection: Condom  ?Other Topics  Concern  ? Not on file  ?Social History Narrative  ? Not on file  ? ?Social Determinants of Health  ? ?Financial Resource Strain: Not on file  ?Food Insecurity: Not on file  ?Transportation Needs: Not on file  ?Physical Activity: Not on file  ?Stress: Not on file  ?Social Connections: Not on file  ?Intimate Partner Violence: Not on file  ? ? ?Outpatient Medications Prior to Visit  ?Medication Sig Dispense Refill  ? aspirin EC 81 MG tablet Take 1 tablet (81 mg total) by mouth daily. 90 tablet 3  ? busPIRone (BUSPAR) 5 MG tablet TAKE 1 TABLET BY MOUTH THREE TIMES A DAY 270 tablet 1  ? metFORMIN (GLUCOPHAGE) 500 MG tablet Take 1 tablet (500 mg total) by mouth 2 (two) times daily with a meal. 180 tablet 1  ? pantoprazole (PROTONIX) 40 MG tablet TAKE 1 CAPSULE Daily take 30 minutes before breakfast every morning    ? rosuvastatin (CRESTOR) 20 MG tablet TAKE 1 TABLET BY MOUTH EVERY DAY 90 tablet 3  ? tamsulosin (FLOMAX) 0.4 MG CAPS capsule TAKE 1 CAPSULE BY MOUTH EVERY DAY 90 capsule 1  ? telmisartan-hydrochlorothiazide (MICARDIS HCT) 40-12.5 MG tablet Take 1 tablet by mouth daily. 90 tablet 3  ? triamcinolone (KENALOG) 0.1 % Apply 1 application topically as needed. 80 g 5  ? ?No facility-administered medications prior to visit.  ? ? ?No Known Allergies ? ?Review of Systems  ?Constitutional:  Negative for fever.  ?HENT:  Negative for congestion, ear pain, sinus pain and  sore throat.   ?Respiratory:  Negative for wheezing.   ?Cardiovascular:  Negative for palpitations.  ?Gastrointestinal:  Negative for blood in stool, constipation, diarrhea, nausea and vomiting.  ?Gen

## 2021-10-24 NOTE — Assessment & Plan Note (Signed)
Avoid offending foods, start probiotics. Do not eat large meals in late evening and consider raising head of bed. Is doing much better on Pantoprazole encouraged to minimize use when able and minimize size of meals ?

## 2021-10-26 ENCOUNTER — Other Ambulatory Visit: Payer: Self-pay

## 2021-10-26 MED ORDER — METFORMIN HCL 500 MG PO TABS: 500.0000 mg | ORAL_TABLET | Freq: Three times a day (TID) | ORAL | 1 refills | Status: DC

## 2021-11-11 ENCOUNTER — Other Ambulatory Visit: Payer: Self-pay | Admitting: Family Medicine

## 2021-11-11 ENCOUNTER — Other Ambulatory Visit: Payer: Self-pay | Admitting: Cardiology

## 2021-11-14 ENCOUNTER — Other Ambulatory Visit: Payer: Self-pay | Admitting: Family Medicine

## 2021-11-28 DIAGNOSIS — Z129 Encounter for screening for malignant neoplasm, site unspecified: Secondary | ICD-10-CM | POA: Diagnosis not present

## 2021-11-28 DIAGNOSIS — D1801 Hemangioma of skin and subcutaneous tissue: Secondary | ICD-10-CM | POA: Diagnosis not present

## 2021-11-28 DIAGNOSIS — L821 Other seborrheic keratosis: Secondary | ICD-10-CM | POA: Diagnosis not present

## 2021-11-28 DIAGNOSIS — L570 Actinic keratosis: Secondary | ICD-10-CM | POA: Diagnosis not present

## 2021-11-28 DIAGNOSIS — L2089 Other atopic dermatitis: Secondary | ICD-10-CM | POA: Diagnosis not present

## 2021-12-12 ENCOUNTER — Other Ambulatory Visit: Payer: Self-pay

## 2021-12-12 DIAGNOSIS — F17210 Nicotine dependence, cigarettes, uncomplicated: Secondary | ICD-10-CM

## 2021-12-12 DIAGNOSIS — Z122 Encounter for screening for malignant neoplasm of respiratory organs: Secondary | ICD-10-CM

## 2021-12-12 DIAGNOSIS — Z87891 Personal history of nicotine dependence: Secondary | ICD-10-CM

## 2022-01-03 ENCOUNTER — Ambulatory Visit (INDEPENDENT_AMBULATORY_CARE_PROVIDER_SITE_OTHER): Payer: BC Managed Care – PPO | Admitting: Acute Care

## 2022-01-03 ENCOUNTER — Encounter: Payer: Self-pay | Admitting: Acute Care

## 2022-01-03 DIAGNOSIS — F17210 Nicotine dependence, cigarettes, uncomplicated: Secondary | ICD-10-CM | POA: Diagnosis not present

## 2022-01-03 NOTE — Patient Instructions (Signed)
Thank you for participating in the [facility]. It was our pleasure to meet you today. We will call you with the results of your scan within the next few days. Your scan will be assigned a Lung RADS category score by the physicians reading the scans.  This Lung RADS score determines follow up scanning.  See below for description of categories, and follow up screening recommendations. We will be in touch to schedule your follow up screening annually or based on recommendations of our providers. We will fax a copy of your scan results to your Primary Care Physician, or the physician who referred you to the program, to ensure they have the results. Please call the office if you have any questions or concerns regarding your scanning experience or results.  Our office number is [phone]. Please speak with Denise Phelps, RN. , or  Denise Buckner RN, They are  our Lung Cancer Screening RN.'s If They are unavailable when you call, Please leave a message on the voice mail. We will return your call at our earliest convenience.This voice mail is monitored several times a day.  Remember, if your scan is normal, we will scan you annually as long as you continue to meet the criteria for the program. (Age 55-77, Current smoker or smoker who has quit within the last 15 years). If you are a smoker, remember, quitting is the single most powerful action that you can take to decrease your risk of lung cancer and other pulmonary, breathing related problems. We know quitting is hard, and we are here to help.  Please let us know if there is anything we can do to help you meet your goal of quitting. If you are a former smoker, congratulations. We are proud of you! Remain smoke free! Remember you can refer friends or family members through the number above.  We will screen them to make sure they meet criteria for the program. Thank you for helping us take better care of you by  participating in Lung Screening.  You can receive free nicotine replacement therapy ( patches, gum or mints) by calling 1-800-QUIT NOW. Please call so we can get you on the path to becoming  a non-smoker. I know it is hard, but you can do this!  Lung RADS Categories:  Lung RADS 1: no nodules or definitely non-concerning nodules.  Recommendation is for a repeat annual scan in 12 months.  Lung RADS 2:  nodules that are non-concerning in appearance and behavior with a very low likelihood of becoming an active cancer. Recommendation is for a repeat annual scan in 12 months.  Lung RADS 3: nodules that are probably non-concerning , includes nodules with a low likelihood of becoming an active cancer.  Recommendation is for a 6-month repeat screening scan. Often noted after an upper respiratory illness. We will be in touch to make sure you have no questions, and to schedule your 6-month scan.  Lung RADS 4 A: nodules with concerning findings, recommendation is most often for a follow up scan in 3 months or additional testing based on our provider's assessment of the scan. We will be in touch to make sure you have no questions and to schedule the recommended 3 month follow up scan.  Lung RADS 4 B:  indicates findings that are concerning. We will be in touch with you to schedule additional diagnostic testing based on our provider's  assessment of the scan.  Other options for assistance in smoking cessation (   As covered by your insurance benefits)  Hypnosis for smoking cessation  [employer] [phone]  Acupuncture for smoking cessation  [facility] [phone]   

## 2022-01-03 NOTE — Progress Notes (Signed)
Virtual Visit via Telephone Note  I connected with Nathan Hess on 12/26/21 at  3:30 PM EDT by telephone and verified that I am speaking with the correct person using two identifiers.  Location: Patient:  At home Provider:  [address], [place], [place], [address]    I discussed the limitations, risks, security and privacy concerns of performing an evaluation and management service by telephone and the availability of in person appointments. I also discussed with the patient that there may be a patient responsible charge related to this service. The patient expressed understanding and agreed to proceed.   Shared Decision Making Visit Lung Cancer Screening Program [phone])   Eligibility: Age 71 y.o. Pack Years Smoking History Calculation 54 pack year smoking history (# packs/per year x # years smoked) Recent History of coughing up blood  no Unexplained weight loss? no ( >Than 15 pounds within the last 6 months ) Prior History Lung / other cancer no (Diagnosis within the last 5 years already requiring surveillance chest CT Scans). Smoking Status Current Smoker Former Smokers: Years since quit:  NA  Quit Date:  NA  Visit Components: Discussion included one or more decision making aids. yes Discussion included risk/benefits of screening. yes Discussion included potential follow up diagnostic testing for abnormal scans. yes Discussion included meaning and risk of over diagnosis. yes Discussion included meaning and risk of False Positives. yes Discussion included meaning of total radiation exposure. yes  Counseling Included: Importance of adherence to annual lung cancer LDCT screening. yes Impact of comorbidities on ability to participate in the program. yes Ability and willingness to under diagnostic treatment. yes  Smoking Cessation Counseling: Current Smokers:  Discussed importance of smoking cessation. yes Information about tobacco cessation classes and  interventions provided to patient. yes Patient provided with "ticket" for LDCT Scan. yes Symptomatic Patient. no  Counseling NA Diagnosis Code: Tobacco Use Z72.0 Asymptomatic Patient yes  Counseling (Intermediate counseling: > three minutes counseling) [id number] Former Smokers:  Discussed the importance of maintaining cigarette abstinence. yes Diagnosis Code: Personal History of Nicotine Dependence. P59.163 Information about tobacco cessation classes and interventions provided to patient. Yes Patient provided with "ticket" for LDCT Scan. yes Written Order for Lung Cancer Screening with LDCT placed in Epic. Yes (CT Chest Lung Cancer Screening Low Dose W/O CM) [id number] Z12.2-Screening of respiratory organs Z87.891-Personal history of nicotine dependence  I have spent 25 minutes of face to face/ virtual visit   time with  Nathan Hess discussing the risks and benefits of lung cancer screening. We viewed / discussed a power point together that explained in detail the above noted topics. We paused at intervals to allow for questions to be asked and answered to ensure understanding.We discussed that the single most powerful action that he can take to decrease his risk of developing lung cancer is to quit smoking. We discussed whether or not he is ready to commit to setting a quit date. We discussed options for tools to aid in quitting smoking including nicotine replacement therapy, non-nicotine medications, support groups, Quit Smart classes, and behavior modification. We discussed that often times setting smaller, more achievable goals, such as eliminating 1 cigarette a day for a week and then 2 cigarettes a day for a week can be helpful in slowly decreasing the number of cigarettes smoked. This allows for a sense of accomplishment as well as providing a clinical benefit. I provided  him  with smoking cessation  information  with contact information for community resources,  classes, free nicotine  replacement therapy, and access to mobile apps, text messaging, and on-line smoking cessation help. I have also provided  him  the office contact information in the event he needs to contact me, or the screening staff. We discussed the time and location of the scan, and that either Abigail Miyamoto RN, Karlton Lemon, RN  or I will call / send a letter with the results within 24-72 hours of receiving them. The patient verbalized understanding of all of  the above and had no further questions upon leaving the office. They have my contact information in the event they have any further questions.  I spent 3 minutes counseling on smoking cessation and the health risks of continued tobacco abuse.  I explained to the patient that there has been a high incidence of coronary artery disease noted on these exams. I explained that this is a non-gated exam therefore degree or severity cannot be determined. This patient is on statin therapy. I have asked the patient to follow-up with their PCP regarding any incidental finding of coronary artery disease and management with diet or medication as their PCP  feels is clinically indicated. The patient verbalized understanding of the above and had no further questions upon completion of the visit.      Bevelyn Ngo, NP  01/03/2022

## 2022-01-04 ENCOUNTER — Ambulatory Visit (HOSPITAL_BASED_OUTPATIENT_CLINIC_OR_DEPARTMENT_OTHER)
Admission: RE | Admit: 2022-01-04 | Discharge: 2022-01-04 | Disposition: A | Discharge status: Home / Self Care | Payer: BC Managed Care – PPO | Source: Ambulatory Visit | Attending: Internal Medicine | Admitting: Internal Medicine

## 2022-01-04 DIAGNOSIS — F17210 Nicotine dependence, cigarettes, uncomplicated: Secondary | ICD-10-CM | POA: Diagnosis not present

## 2022-01-04 DIAGNOSIS — Z122 Encounter for screening for malignant neoplasm of respiratory organs: Secondary | ICD-10-CM | POA: Insufficient documentation

## 2022-01-04 DIAGNOSIS — Z87891 Personal history of nicotine dependence: Secondary | ICD-10-CM | POA: Diagnosis not present

## 2022-01-08 ENCOUNTER — Other Ambulatory Visit: Payer: Self-pay | Admitting: Acute Care

## 2022-01-08 DIAGNOSIS — Z87891 Personal history of nicotine dependence: Secondary | ICD-10-CM

## 2022-01-08 DIAGNOSIS — F17210 Nicotine dependence, cigarettes, uncomplicated: Secondary | ICD-10-CM

## 2022-01-08 DIAGNOSIS — Z122 Encounter for screening for malignant neoplasm of respiratory organs: Secondary | ICD-10-CM

## 2022-02-02 DIAGNOSIS — E119 Type 2 diabetes mellitus without complications: Secondary | ICD-10-CM | POA: Diagnosis not present

## 2022-02-02 LAB — HM DIABETES EYE EXAM

## 2022-03-07 ENCOUNTER — Encounter: Payer: Self-pay | Admitting: *Deleted

## 2022-04-03 ENCOUNTER — Encounter: Payer: Self-pay | Admitting: Cardiology

## 2022-04-05 ENCOUNTER — Other Ambulatory Visit: Payer: Self-pay

## 2022-04-05 DIAGNOSIS — I714 Abdominal aortic aneurysm, without rupture, unspecified: Secondary | ICD-10-CM

## 2022-04-24 ENCOUNTER — Ambulatory Visit: Payer: BC Managed Care – PPO | Admitting: Family Medicine

## 2022-04-24 ENCOUNTER — Ambulatory Visit: Payer: BC Managed Care – PPO | Attending: Cardiology

## 2022-04-24 DIAGNOSIS — I714 Abdominal aortic aneurysm, without rupture, unspecified: Secondary | ICD-10-CM

## 2022-04-24 DIAGNOSIS — I7143 Infrarenal abdominal aortic aneurysm, without rupture: Secondary | ICD-10-CM | POA: Diagnosis not present

## 2022-04-26 ENCOUNTER — Telehealth: Payer: Self-pay

## 2022-04-26 NOTE — Telephone Encounter (Signed)
Both ultrasounds notified to the patient however, he continues to experience the sx's he talked about on his last visit he said. Patient was to know if there is anything else the can allow him to manage or resolve sx's. Message sent to Dr. Raliegh Ip for further instructions.

## 2022-04-26 NOTE — Telephone Encounter (Signed)
-----   Message from Richardo Priest, MD sent at 04/26/2022  9:51 AM EDT ----- Good results unchanged I would say that the enlargement aorta is mild to moderate and we can recheck in 1 year.  Put in recall duplex abdominal aorta for abdominal aortic aneurysm 1 year

## 2022-06-26 NOTE — Progress Notes (Signed)
Subjective:   By signing my name below, I, Vickey SagesMohammed Iqbal, attest that this documentation has been prepared under the direction and in the presence of Bradd CanaryBlyth, Aithan Farrelly A, MD., 06/28/2022.     Patient ID: Nathan Hess, male    DOB: 1950-11-15, 71 y.o.   MRN: [id number]  Chief Complaint  Patient presents with   Follow-up    Follow up   HPI Patient is in today for an office visit. Patient denies CP/ palpitations/ SOB/ HA/ congestion/ fevers/ GI or GU c/o.  Gastroesophageal Reflux Disease Patient has stopped taking Pantoprazole 40 mg. He reports that exercise has significantly reduced bloating and GI issues related to his gastroesophageal reflux disease.  Weight Management Patient reports that he has lost 20 lbs since 01/2021. Body mass index is 29.76 kg/m. He walks >8000 steps daily to remain active. He is also mindful of his diet and attempting to lower his HGBA1C. Wt Readings from Last 3 Encounters:  06/28/22 213 lb 6.4 oz (96.8 kg)  10/24/21 226 lb 3.2 oz (102.6 kg)  07/25/21 230 lb (104.3 kg)   Lab Results  Component Value Date   HGBA1C 6.9 (H) 06/28/2022   Past Medical History:  Diagnosis Date   AAA (abdominal aortic aneurysm) (HCC) 10/06/2018   Anxiety 09/24/2011   Atopic dermatitis 11/26/2016   BPH (benign prostatic hyperplasia) 08/26/2019   Constipation 05/25/2019   Costochondritis 04/03/2018   Depression    Diabetes mellitus type 2 in obese (HCC) 05/25/2012   Enlarged prostate    Erectile dysfunction 06/13/2010   Formatting of this note might be different from the original. Cialis 20 mg daily prn    Last Assessment & Plan:  Formatting of this note might be different from the original. Patient has good results with Cialis 20 mg as needed, but has run out. In the past, he tried Viagra with inadequate results. He was given a refill for #9 with 11 refills.   Gallstone 10/06/2018   Headache 04/10/2010   Formatting of this note might be different from the original. Sumatriptan  100 mg prn and hydrocodone/APAP 7.5/500 mg prn  ICD-10 cut over   Last Assessment & Plan:  Formatting of this note might be different from the original. Patient is having a migraine today and was given a sample of relpax 40 mg today because he isn't carrying the sumatriptan with him. He says that he is getting very few migra   Hearing loss in left ear 01/08/2017   History of colonic polyps 06/26/2015   3rd colonoscopy in 2016 with Dr Grayling [place] in [place]   Hypercholesterolemia 04/11/2007   Formatting of this note might be different from the original. Crestor 20 mg daily   Last Assessment & Plan:  Formatting of this note might be different from the original. Patient is taking Crestor 20 mg but has some compliance issues, missing it 2-3 times weekly. He says that he misses it because he can't find it and I have suggested that he use a med-minder. His lipids are at goal and I expect hi   Hyperlipidemia 05/25/2012   Hypogonadism male 09/24/2011   Left knee DJD    Lumbago 04/25/2014   Migraine headache 08/03/2012   Need for prophylactic vaccination and inoculation against influenza 05/13/2013   Obesity 04/11/2007   Last Assessment & Plan:  Formatting of this note might be different from the original. Patient has gained about 22 pounds since he quit smoking 6 months ago. He is craving food more and  says that he can't "fight two devils" at once. I have made a few suggestions about how he could reduce it.   Other malaise and fatigue 11/01/2012   Prediabetes 11/26/2016   Preventative health care 09/22/2014   Prostatic hypertrophy 04/10/2010   Formatting of this note might be different from the original. Finasteride 5 mg daily  Last Assessment & Plan:  Formatting of this note might be different from the original. Patient isn't having significant symptoms on the finasteride 5 mg daily.   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 Patient is having no pulmonary symptoms since he has quit smoking.   Reduced libido 08/20/2011   Salivary gland stone 02/28/2016   Seborrheic keratoses 04/03/2018   Tobacco use disorder 10/30/2012   Type 2 diabetes mellitus (HCC) 04/10/2010   Last Assessment & Plan:  Formatting of this note might be different from the original. Patient has had IFG for years and hasn't been able to take my suggestions regarding weight loss and healthy eating. Blood work done last month shows a glucose that is now 129 and A1c done today, POCT was 6.6%. I have told the patient that given his 22 pound weight gain since smoking cessation, this isn't surpris   Unspecified essential hypertension 05/25/2012   Does not see a cardiologist   Past Surgical History:  Procedure Laterality Date   left hand surgery     PARTIAL KNEE ARTHROPLASTY Left 04/06/2013   Procedure: UNICOMPARTMENTAL LEFT KNEE;  Surgeon: Nilda Simmer, MD;  Location: [facility] OR;  Service: Orthopedics;  Laterality: Left;   UMBILICAL HERNIA REPAIR  2017   w/ small ventral ex mesh   Family History  Problem Relation Age of Onset   Alzheimer's disease Mother    Hypertension Father    Diabetes Brother        ?   Alcohol abuse Maternal Grandfather    Cancer Paternal Grandmother    Heart disease Paternal Grandfather        MI   Social History   Socioeconomic History   Marital status: Married    Spouse name: Not on file   Number of children: Not on file   Years of education: Not on file   Highest education level: Not on file  Occupational History   Not on file  Tobacco Use   Smoking status: Every Day    Packs/day: 1.00    Years: 15.00    Total pack years: 15.00    Types: Cigarettes   Smokeless tobacco: Never  Vaping Use   Vaping Use: Every day  Substance and Sexual Activity   Alcohol use: Yes    Comment: 1 beer about every 2 months    Drug use: No   Sexual activity: Yes    Birth control/protection: Condom  Other Topics Concern    Not on file  Social History Narrative   Not on file   Social Determinants of Health   Financial Resource Strain: Not on file  Food Insecurity: Not on file  Transportation Needs: Not on file  Physical Activity: Not on file  Stress: Not on file  Social Connections: Not on file  Intimate Partner Violence: Not on file   Outpatient Medications Prior to Visit  Medication Sig Dispense Refill   aspirin EC 81 MG tablet Take 1 tablet (81 mg total) by mouth daily. 90 tablet 3   busPIRone (BUSPAR) 5 MG  tablet TAKE 1 TABLET BY MOUTH THREE TIMES A DAY 270 tablet 1   metFORMIN (GLUCOPHAGE) 500 MG tablet Take 1 tablet (500 mg total) by mouth with breakfast, with lunch, and with evening meal. 270 tablet 1   rosuvastatin (CRESTOR) 20 MG tablet TAKE 1 TABLET BY MOUTH EVERY DAY 90 tablet 3   tamsulosin (FLOMAX) 0.4 MG CAPS capsule TAKE 1 CAPSULE BY MOUTH EVERY DAY 90 capsule 1   telmisartan-hydrochlorothiazide (MICARDIS HCT) 40-12.5 MG tablet Take 1 tablet by mouth daily. 90 tablet 2   triamcinolone (KENALOG) 0.1 % Apply 1 application topically as needed. 80 g 5   pantoprazole (PROTONIX) 40 MG tablet TAKE 1 CAPSULE Daily take 30 minutes before breakfast every morning     No facility-administered medications prior to visit.   No Known Allergies  Review of Systems  Constitutional:  Negative for chills and fever.  HENT:  Negative for congestion.   Respiratory:  Negative for shortness of breath.   Cardiovascular:  Negative for chest pain and palpitations.  Gastrointestinal:  Negative for abdominal pain, blood in stool, constipation, diarrhea, nausea and vomiting.  Genitourinary:  Negative for dysuria, frequency, hematuria and urgency.  Skin:           Neurological:  Negative for headaches.      Objective:    Physical Exam Constitutional:      General: He is not in acute distress.    Appearance: Normal appearance. He is normal weight. He is not ill-appearing.  HENT:     Head: Normocephalic and  atraumatic.     Right Ear: External ear normal.     Left Ear: External ear normal.     Nose: Nose normal.     Mouth/Throat:     Mouth: Mucous membranes are moist.     Pharynx: Oropharynx is clear.  Eyes:     General:        Right eye: No discharge.        Left eye: No discharge.     Extraocular Movements: Extraocular movements intact.     Conjunctiva/sclera: Conjunctivae normal.     Pupils: Pupils are equal, round, and reactive to light.  Cardiovascular:     Rate and Rhythm: Normal rate and regular rhythm.     Pulses: Normal pulses.     Heart sounds: Normal heart sounds. No murmur heard.    No gallop.  Pulmonary:     Effort: Pulmonary effort is normal. No respiratory distress.     Breath sounds: Normal breath sounds. No wheezing or rales.  Abdominal:     General: Bowel sounds are normal.     Palpations: Abdomen is soft.     Tenderness: There is no abdominal tenderness. There is no guarding.  Musculoskeletal:        General: Normal range of motion.     Cervical back: Normal range of motion.     Right lower leg: No edema.     Left lower leg: No edema.  Skin:    General: Skin is warm and dry.  Neurological:     Mental Status: He is alert and oriented to person, place, and time.  Psychiatric:        Mood and Affect: Mood normal.        Behavior: Behavior normal.        Judgment: Judgment normal.    BP 126/76 (BP Location: Right Arm, Patient Position: Sitting, Cuff Size: Normal)   Pulse 74   Temp (!) 97.5 F (  36.4 C) (Oral)   Resp 16   Ht 5\' 11"  (1.803 m)   Wt 213 lb 6.4 oz (96.8 kg)   SpO2 94%   BMI 29.76 kg/m  Wt Readings from Last 3 Encounters:  06/28/22 213 lb 6.4 oz (96.8 kg)  10/24/21 226 lb 3.2 oz (102.6 kg)  07/25/21 230 lb (104.3 kg)   Diabetic Foot Exam - Simple   No data filed    Lab Results  Component Value Date   WBC 7.3 06/28/2022   HGB 15.2 06/28/2022   HCT 43.2 06/28/2022   PLT 227.0 06/28/2022   GLUCOSE 120 (H) 06/28/2022   CHOL 141  06/28/2022   TRIG 76.0 06/28/2022   HDL 44.80 06/28/2022   LDLCALC 81 06/28/2022   ALT 22 06/28/2022   AST 16 06/28/2022   NA 138 06/28/2022   K 4.8 06/28/2022   CL 102 06/28/2022   CREATININE 0.82 06/28/2022   BUN 12 06/28/2022   CO2 30 06/28/2022   TSH 1.57 06/28/2022   PSA 2.79 04/06/2021   INR 1.00 03/30/2013   HGBA1C 6.9 (H) 06/28/2022   MICROALBUR 1.0 06/28/2022   Lab Results  Component Value Date   TSH 1.57 06/28/2022   Lab Results  Component Value Date   WBC 7.3 06/28/2022   HGB 15.2 06/28/2022   HCT 43.2 06/28/2022   MCV 94.1 06/28/2022   PLT 227.0 06/28/2022   Lab Results  Component Value Date   NA 138 06/28/2022   K 4.8 06/28/2022   CO2 30 06/28/2022   GLUCOSE 120 (H) 06/28/2022   BUN 12 06/28/2022   CREATININE 0.82 06/28/2022   BILITOT 0.7 06/28/2022   ALKPHOS 78 06/28/2022   AST 16 06/28/2022   ALT 22 06/28/2022   PROT 7.3 06/28/2022   ALBUMIN 4.8 06/28/2022   CALCIUM 9.4 06/28/2022   GFR 88.20 06/28/2022   Lab Results  Component Value Date   CHOL 141 06/28/2022   Lab Results  Component Value Date   HDL 44.80 06/28/2022   Lab Results  Component Value Date   LDLCALC 81 06/28/2022   Lab Results  Component Value Date   TRIG 76.0 06/28/2022   Lab Results  Component Value Date   CHOLHDL 3 06/28/2022   Lab Results  Component Value Date   HGBA1C 6.9 (H) 06/28/2022      Assessment & Plan:   Diet/Exercise Patient has been informed about eating healthy foods, hydrating, and meeting a minimum of 4000 daily steps.  Immunizations Patient has been informed about receiving a COVID-19 booster and Prevnar 20 immunization. Immunization History  Administered Date(s) Administered   Influenza, High Dose Seasonal PF 09/10/2017, 04/03/2018, 04/09/2019, 04/09/2019, 04/06/2021, 05/14/2022   Influenza, Seasonal, Injecte, Preservative Fre 07/31/2012   Influenza,inj,Quad PF,6+ Mos 05/13/2013, 04/23/2014, 03/29/2015   Influenza-Unspecified  05/01/2011, 03/25/2020   PFIZER Comirnaty(Gray Top)Covid-19 Tri-Sucrose Vaccine 10/26/2020   PFIZER(Purple Top)SARS-COV-2 Vaccination 09/08/2019, 09/29/2019, 05/18/2020   Pfizer Covid-19 Vaccine Bivalent Booster 8yrs & up 04/10/2021, 08/06/2021   Pneumococcal Conjugate-13 09/21/2014   Pneumococcal Polysaccharide-23 12/27/2015, 05/12/2019   Pneumococcal-Unspecified 08/17/2011   Respiratory Syncytial Virus Vaccine,Recomb Aduvanted(Arexvy) 05/14/2022   Tdap 02/18/2009, 06/24/2015, 02/14/2018   Zoster Recombinat (Shingrix) 08/12/2018, 04/09/2019   Zoster, Live 09/21/2014   Labs Routine blood work will be performed today.  Problem List Items Addressed This Visit     Type 2 diabetes mellitus (HCC)    hgba1c acceptable, minimize simple carbs. Increase exercise as tolerated. Continue current meds. He reports his last hgba1c at work  was 6.8      Relevant Orders   Hemoglobin A1c (Completed)   Urine Microalbumin w/creat. ratio (Completed)   Hypercholesterolemia    Tolerating statin, encouraged heart healthy diet, avoid trans fats, minimize simple carbs and saturated fats. Increase exercise as tolerated      Relevant Orders   Lipid panel (Completed)   Hypertension    Well controlled, no changes to meds. Encouraged heart healthy diet such as the DASH diet and exercise as tolerated.       Relevant Orders   CBC with Differential/Platelet (Completed)   Comprehensive metabolic panel (Completed)   TSH (Completed)   Tobacco use disorder    Continues to smoke 1 ppd, given several tactics for cutting back and then stopping.      Insomnia    Encouraged good sleep hygiene such as dark, quiet room. No blue/green glowing lights such as computer screens in bedroom. No alcohol or stimulants in evening. Cut down on caffeine as able. Regular exercise is helpful but not just prior to bed time.        Acid reflux - Primary    Avoid offending foods, start probiotics. Do not eat large meals in late  evening and consider raising head of bed. Is doing much better on Pantoprazole encouraged to minimize, has largely stopped using, will discontinue, can use Pepcid prn      No orders of the defined types were placed in this encounter.  I, [name], MD, personally preformed the services described in this documentation.  All medical record entries made by the scribe were at my direction and in my presence.  I have reviewed the chart and discharge instructions (if applicable) and agree that the record reflects my personal performance and is accurate and complete. 06/28/2022  I,Mohammed Iqbal,acting as a scribe for [name], MD.,have documented all relevant documentation on the behalf of [name], MD,as directed by  [name], MD while in the presence of [name], MD.  [name], MD

## 2022-06-27 NOTE — Assessment & Plan Note (Signed)
Encouraged good sleep hygiene such as dark, quiet room. No blue/green glowing lights such as computer screens in bedroom. No alcohol or stimulants in evening. Cut down on caffeine as able. Regular exercise is helpful but not just prior to bed time.  

## 2022-06-27 NOTE — Assessment & Plan Note (Signed)
Avoid offending foods, start probiotics. Do not eat large meals in late evening and consider raising head of bed. Is doing much better on Pantoprazole encouraged to minimize, has largely stopped using, will discontinue, can use Pepcid prn

## 2022-06-27 NOTE — Assessment & Plan Note (Signed)
Well controlled, no changes to meds. Encouraged heart healthy diet such as the DASH diet and exercise as tolerated.  °

## 2022-06-27 NOTE — Assessment & Plan Note (Signed)
hgba1c acceptable, minimize simple carbs. Increase exercise as tolerated. Continue current meds. He reports his last hgba1c at work was 6.8

## 2022-06-27 NOTE — Assessment & Plan Note (Signed)
Continues to smoke 1 ppd, given several tactics for cutting back and then stopping.

## 2022-06-27 NOTE — Assessment & Plan Note (Signed)
Tolerating statin, encouraged heart healthy diet, avoid trans fats, minimize simple carbs and saturated fats. Increase exercise as tolerated 

## 2022-06-28 ENCOUNTER — Ambulatory Visit: Payer: BC Managed Care – PPO | Admitting: Family Medicine

## 2022-06-28 VITALS — BP 126/76 | HR 74 | Temp 97.5°F | Resp 16 | Ht 71.0 in | Wt 213.4 lb

## 2022-06-28 DIAGNOSIS — I10 Essential (primary) hypertension: Secondary | ICD-10-CM

## 2022-06-28 DIAGNOSIS — E7800 Pure hypercholesterolemia, unspecified: Secondary | ICD-10-CM | POA: Diagnosis not present

## 2022-06-28 DIAGNOSIS — E1165 Type 2 diabetes mellitus with hyperglycemia: Secondary | ICD-10-CM | POA: Diagnosis not present

## 2022-06-28 DIAGNOSIS — K219 Gastro-esophageal reflux disease without esophagitis: Secondary | ICD-10-CM

## 2022-06-28 DIAGNOSIS — F17200 Nicotine dependence, unspecified, uncomplicated: Secondary | ICD-10-CM

## 2022-06-28 DIAGNOSIS — F5101 Primary insomnia: Secondary | ICD-10-CM

## 2022-06-28 LAB — CBC WITH DIFFERENTIAL/PLATELET
Basophils Absolute: 0 10*3/uL (ref 0.0–0.1)
Basophils Relative: 0.6 % (ref 0.0–3.0)
Eosinophils Absolute: 0.2 10*3/uL (ref 0.0–0.7)
Eosinophils Relative: 2.2 % (ref 0.0–5.0)
HCT: 43.2 % (ref 39.0–52.0)
Hemoglobin: 15.2 g/dL (ref 13.0–17.0)
Lymphocytes Relative: 33.2 % (ref 12.0–46.0)
Lymphs Abs: 2.4 10*3/uL (ref 0.7–4.0)
MCHC: 35.2 g/dL (ref 30.0–36.0)
MCV: 94.1 fl (ref 78.0–100.0)
Monocytes Absolute: 0.7 10*3/uL (ref 0.1–1.0)
Monocytes Relative: 9.8 % (ref 3.0–12.0)
Neutro Abs: 4 10*3/uL (ref 1.4–7.7)
Neutrophils Relative %: 54.2 % (ref 43.0–77.0)
Platelets: 227 10*3/uL (ref 150.0–400.0)
RBC: 4.59 Mil/uL (ref 4.22–5.81)
RDW: 12.8 % (ref 11.5–15.5)
WBC: 7.3 10*3/uL (ref 4.0–10.5)

## 2022-06-28 LAB — COMPREHENSIVE METABOLIC PANEL
ALT: 22 U/L (ref 0–53)
AST: 16 U/L (ref 0–37)
Albumin: 4.8 g/dL (ref 3.5–5.2)
Alkaline Phosphatase: 78 U/L (ref 39–117)
BUN: 12 mg/dL (ref 6–23)
CO2: 30 mEq/L (ref 19–32)
Calcium: 9.4 mg/dL (ref 8.4–10.5)
Chloride: 102 mEq/L (ref 96–112)
Creatinine, Ser: 0.82 mg/dL (ref 0.40–1.50)
GFR: 88.2 mL/min (ref >60.00)
Glucose, Bld: 120 mg/dL — ABNORMAL HIGH (ref 70–99)
Potassium: 4.8 mEq/L (ref 3.5–5.1)
Sodium: 138 mEq/L (ref 135–145)
Total Bilirubin: 0.7 mg/dL (ref 0.2–1.2)
Total Protein: 7.3 g/dL (ref 6.0–8.3)

## 2022-06-28 LAB — MICROALBUMIN / CREATININE URINE RATIO
Creatinine,U: 100.2 mg/dL
Microalb Creat Ratio: 1 mg/g (ref 0.0–30.0)
Microalb, Ur: 1 mg/dL (ref 0.0–1.9)

## 2022-06-28 LAB — LIPID PANEL
Cholesterol: 141 mg/dL (ref 0–200)
HDL: 44.8 mg/dL (ref >39.00)
LDL Cholesterol: 81 mg/dL (ref 0–99)
NonHDL: 95.75
Total CHOL/HDL Ratio: 3
Triglycerides: 76 mg/dL (ref 0.0–149.0)
VLDL: 15.2 mg/dL (ref 0.0–40.0)

## 2022-06-28 LAB — TSH: TSH: 1.57 u[IU]/mL (ref 0.35–5.50)

## 2022-06-28 LAB — HEMOGLOBIN A1C: Hgb A1c MFr Bld: 6.9 % — ABNORMAL HIGH (ref 4.6–6.5)

## 2022-06-28 NOTE — Patient Instructions (Addendum)
Covid booster  Prevnar 20  Carbohydrate Counting for Diabetes Mellitus, Adult Carbohydrate counting is a method of keeping track of how many carbohydrates you eat. Eating carbohydrates increases the amount of sugar (glucose) in the blood. Counting how many carbohydrates you eat improves how well you manage your blood glucose. This, in turn, helps you manage your diabetes. Carbohydrates are measured in grams (g) per serving. It is important to know how many carbohydrates (in grams or by serving size) you can have in each meal. This is different for every person. A dietitian can help you make a meal plan and calculate how many carbohydrates you should have at each meal and snack. What foods contain carbohydrates? Carbohydrates are found in the following foods: Grains, such as breads and cereals. Dried beans and soy products. Starchy vegetables, such as potatoes, peas, and corn. Fruit and fruit juices. Milk and yogurt. Sweets and snack foods, such as cake, cookies, candy, chips, and soft drinks. How do I count carbohydrates in foods? There are two ways to count carbohydrates in food. You can read food labels or learn standard serving sizes of foods. You can use either of these methods or a combination of both. Using the Nutrition Facts label The Nutrition Facts list is included on the labels of almost all packaged foods and beverages in the [place]. It includes: The serving size. Information about nutrients in each serving, including the grams of carbohydrate per serving. To use the Nutrition Facts, decide how many servings you will have. Then, multiply the number of servings by the number of carbohydrates per serving. The resulting number is the total grams of carbohydrates that you will be having. Learning the standard serving sizes of foods When you eat carbohydrate foods that are not packaged or do not include Nutrition Facts on the label, you need to measure the servings in order to  count the grams of carbohydrates. Measure the foods that you will eat with a food scale or measuring cup, if needed. Decide how many standard-size servings you will eat. Multiply the number of servings by 15. For foods that contain carbohydrates, one serving equals 15 g of carbohydrates. For example, if you eat 2 cups or 10 oz (300 g) of strawberries, you will have eaten 2 servings and 30 g of carbohydrates (2 servings x 15 g = 30 g). For foods that have more than one food mixed, such as soups and casseroles, you must count the carbohydrates in each food that is included. The following list contains standard serving sizes of common carbohydrate-rich foods. Each of these servings has about 15 g of carbohydrates: 1 slice of bread. 1 six-inch (15 cm) tortilla. ? cup or 2 oz (53 g) cooked rice or pasta.  cup or 3 oz (85 g) cooked or canned, drained and rinsed beans or lentils.  cup or 3 oz (85 g) starchy vegetable, such as peas, corn, or squash.  cup or 4 oz (120 g) hot cereal.  cup or 3 oz (85 g) boiled or mashed potatoes, or  or 3 oz (85 g) of a large baked potato.  cup or 4 fl oz (118 mL) fruit juice. 1 cup or 8 fl oz (237 mL) milk. 1 small or 4 oz (106 g) apple.  or 2 oz (63 g) of a medium banana. 1 cup or 5 oz (150 g) strawberries. 3 cups or 1 oz (28.3 g) popped popcorn. What is an example of carbohydrate counting? To calculate the grams of carbohydrates in  this sample meal, follow the steps shown below. Sample meal 3 oz (85 g) chicken breast. ? cup or 4 oz (106 g) brown rice.  cup or 3 oz (85 g) corn. 1 cup or 8 fl oz (237 mL) milk. 1 cup or 5 oz (150 g) strawberries with sugar-free whipped topping. Carbohydrate calculation Identify the foods that contain carbohydrates: Rice. Corn. Milk. Strawberries. Calculate how many servings you have of each food: 2 servings rice. 1 serving corn. 1 serving milk. 1 serving strawberries. Multiply each number of servings by 15 g: 2  servings rice x 15 g = 30 g. 1 serving corn x 15 g = 15 g. 1 serving milk x 15 g = 15 g. 1 serving strawberries x 15 g = 15 g. Add together all of the amounts to find the total grams of carbohydrates eaten: 30 g + 15 g + 15 g + 15 g = 75 g of carbohydrates total. What are tips for following this plan? Shopping Develop a meal plan and then make a shopping list. Buy fresh and frozen vegetables, fresh and frozen fruit, dairy, eggs, beans, lentils, and whole grains. Look at food labels. Choose foods that have more fiber and less sugar. Avoid processed foods and foods with added sugars. Meal planning Aim to have the same number of grams of carbohydrates at each meal and for each snack time. Plan to have regular, balanced meals and snacks. Where to find more information American Diabetes Association: diabetes.org Centers for Disease Control and Prevention: TonerPromos.no Academy of Nutrition and Dietetics: eatright.org Association of Diabetes Care & Education Specialists: diabeteseducator.org Summary Carbohydrate counting is a method of keeping track of how many carbohydrates you eat. Eating carbohydrates increases the amount of sugar (glucose) in your blood. Counting how many carbohydrates you eat improves how well you manage your blood glucose. This helps you manage your diabetes. A dietitian can help you make a meal plan and calculate how many carbohydrates you should have at each meal and snack. This information is not intended to replace advice given to you by your health care provider. Make sure you discuss any questions you have with your health care provider. Document Revised: 02/10/2020 Document Reviewed: 02/10/2020 Elsevier Patient Education  2023 [employer].

## 2022-07-05 ENCOUNTER — Other Ambulatory Visit: Payer: Self-pay | Admitting: Family Medicine

## 2022-07-05 DIAGNOSIS — F17200 Nicotine dependence, unspecified, uncomplicated: Secondary | ICD-10-CM

## 2022-07-05 DIAGNOSIS — M545 Low back pain, unspecified: Secondary | ICD-10-CM

## 2022-07-05 DIAGNOSIS — E1169 Type 2 diabetes mellitus with other specified complication: Secondary | ICD-10-CM

## 2022-07-05 DIAGNOSIS — E785 Hyperlipidemia, unspecified: Secondary | ICD-10-CM

## 2022-07-05 DIAGNOSIS — F32A Depression, unspecified: Secondary | ICD-10-CM

## 2022-07-05 DIAGNOSIS — E669 Obesity, unspecified: Secondary | ICD-10-CM

## 2022-07-05 DIAGNOSIS — I10 Essential (primary) hypertension: Secondary | ICD-10-CM

## 2022-07-09 DIAGNOSIS — Z125 Encounter for screening for malignant neoplasm of prostate: Secondary | ICD-10-CM | POA: Diagnosis not present

## 2022-07-26 DIAGNOSIS — R972 Elevated prostate specific antigen [PSA]: Secondary | ICD-10-CM | POA: Diagnosis not present

## 2022-07-26 DIAGNOSIS — R3914 Feeling of incomplete bladder emptying: Secondary | ICD-10-CM | POA: Diagnosis not present

## 2022-12-15 ENCOUNTER — Other Ambulatory Visit: Payer: Self-pay | Admitting: Cardiology

## 2023-01-01 ENCOUNTER — Other Ambulatory Visit: Payer: Self-pay | Admitting: Cardiology

## 2023-01-07 ENCOUNTER — Ambulatory Visit (HOSPITAL_BASED_OUTPATIENT_CLINIC_OR_DEPARTMENT_OTHER)
Admission: RE | Admit: 2023-01-07 | Discharge: 2023-01-07 | Disposition: A | Discharge status: Home / Self Care | Payer: Medicare (Managed Care) | Source: Ambulatory Visit | Attending: Family Medicine | Admitting: Family Medicine

## 2023-01-07 DIAGNOSIS — Z87891 Personal history of nicotine dependence: Secondary | ICD-10-CM | POA: Insufficient documentation

## 2023-01-07 DIAGNOSIS — Z122 Encounter for screening for malignant neoplasm of respiratory organs: Secondary | ICD-10-CM | POA: Insufficient documentation

## 2023-01-07 DIAGNOSIS — F17210 Nicotine dependence, cigarettes, uncomplicated: Secondary | ICD-10-CM | POA: Insufficient documentation

## 2023-01-08 ENCOUNTER — Encounter: Payer: Self-pay | Admitting: Family Medicine

## 2023-01-08 ENCOUNTER — Ambulatory Visit (INDEPENDENT_AMBULATORY_CARE_PROVIDER_SITE_OTHER): Payer: Medicare (Managed Care) | Admitting: Family Medicine

## 2023-01-08 VITALS — BP 126/74 | HR 74 | Temp 97.8°F | Resp 16 | Ht 71.0 in | Wt 223.2 lb

## 2023-01-08 DIAGNOSIS — I10 Essential (primary) hypertension: Secondary | ICD-10-CM | POA: Diagnosis not present

## 2023-01-08 DIAGNOSIS — Z0000 Encounter for general adult medical examination without abnormal findings: Secondary | ICD-10-CM

## 2023-01-08 DIAGNOSIS — R972 Elevated prostate specific antigen [PSA]: Secondary | ICD-10-CM

## 2023-01-08 DIAGNOSIS — E669 Obesity, unspecified: Secondary | ICD-10-CM

## 2023-01-08 DIAGNOSIS — E1165 Type 2 diabetes mellitus with hyperglycemia: Secondary | ICD-10-CM

## 2023-01-08 DIAGNOSIS — E7800 Pure hypercholesterolemia, unspecified: Secondary | ICD-10-CM

## 2023-01-08 DIAGNOSIS — M25561 Pain in right knee: Secondary | ICD-10-CM

## 2023-01-08 DIAGNOSIS — Z7984 Long term (current) use of oral hypoglycemic drugs: Secondary | ICD-10-CM

## 2023-01-08 DIAGNOSIS — F17200 Nicotine dependence, unspecified, uncomplicated: Secondary | ICD-10-CM

## 2023-01-08 LAB — COMPREHENSIVE METABOLIC PANEL
ALT: 24 U/L (ref 0–53)
AST: 15 U/L (ref 0–37)
Albumin: 4.5 g/dL (ref 3.5–5.2)
Alkaline Phosphatase: 70 U/L (ref 39–117)
BUN: 21 mg/dL (ref 6–23)
CO2: 26 mEq/L (ref 19–32)
Calcium: 9.2 mg/dL (ref 8.4–10.5)
Chloride: 101 mEq/L (ref 96–112)
Creatinine, Ser: 0.83 mg/dL (ref 0.40–1.50)
GFR: 87.55 mL/min (ref >60.00)
Glucose, Bld: 159 mg/dL — ABNORMAL HIGH (ref 70–99)
Potassium: 4.4 mEq/L (ref 3.5–5.1)
Sodium: 135 mEq/L (ref 135–145)
Total Bilirubin: 0.5 mg/dL (ref 0.2–1.2)
Total Protein: 7.4 g/dL (ref 6.0–8.3)

## 2023-01-08 LAB — CBC WITH DIFFERENTIAL/PLATELET
Basophils Absolute: 0.1 10*3/uL (ref 0.0–0.1)
Basophils Relative: 0.7 % (ref 0.0–3.0)
Eosinophils Absolute: 0.2 10*3/uL (ref 0.0–0.7)
Eosinophils Relative: 2.7 % (ref 0.0–5.0)
HCT: 42.2 % (ref 39.0–52.0)
Hemoglobin: 14.7 g/dL (ref 13.0–17.0)
Lymphocytes Relative: 29.5 % (ref 12.0–46.0)
Lymphs Abs: 2.6 10*3/uL (ref 0.7–4.0)
MCHC: 34.9 g/dL (ref 30.0–36.0)
MCV: 94.8 fl (ref 78.0–100.0)
Monocytes Absolute: 0.7 10*3/uL (ref 0.1–1.0)
Monocytes Relative: 8.2 % (ref 3.0–12.0)
Neutro Abs: 5.2 10*3/uL (ref 1.4–7.7)
Neutrophils Relative %: 58.9 % (ref 43.0–77.0)
Platelets: 222 10*3/uL (ref 150.0–400.0)
RBC: 4.46 Mil/uL (ref 4.22–5.81)
RDW: 13.1 % (ref 11.5–15.5)
WBC: 8.8 10*3/uL (ref 4.0–10.5)

## 2023-01-08 LAB — LIPID PANEL
Cholesterol: 140 mg/dL (ref 0–200)
HDL: 40.8 mg/dL (ref >39.00)
LDL Cholesterol: 78 mg/dL (ref 0–99)
NonHDL: 99.37
Total CHOL/HDL Ratio: 3
Triglycerides: 106 mg/dL (ref 0.0–149.0)
VLDL: 21.2 mg/dL (ref 0.0–40.0)

## 2023-01-08 LAB — TSH: TSH: 1.83 u[IU]/mL (ref 0.35–5.50)

## 2023-01-08 LAB — PSA: PSA: 2.19 ng/mL (ref 0.10–4.00)

## 2023-01-08 LAB — HEMOGLOBIN A1C: Hgb A1c MFr Bld: 7.4 % — ABNORMAL HIGH (ref 4.6–6.5)

## 2023-01-08 NOTE — Assessment & Plan Note (Signed)
Patient encouraged to maintain heart healthy diet, regular exercise, adequate sleep. Consider daily probiotics. Take medications as prescribed. Labs ordered and reviewed. Colonoscopy 01/2020 repeat in 2026. Given and reviewed copy of ACP documents from [employer] and encouraged to complete and return

## 2023-01-08 NOTE — Assessment & Plan Note (Signed)
Tolerating statin, encouraged heart healthy diet, avoid trans fats, minimize simple carbs and saturated fats. Increase exercise as tolerated 

## 2023-01-08 NOTE — Assessment & Plan Note (Signed)
hgba1c acceptable, minimize simple carbs. Increase exercise as tolerated. Continue current meds. He reports his last hgba1c at work was 6.8 

## 2023-01-08 NOTE — Patient Instructions (Addendum)
Preventive Care 65 Years and Older, Male Preventive care refers to lifestyle choices and visits with your health care provider that can promote health and wellness. Preventive care visits are also called wellness exams. What can I expect for my preventive care visit? Counseling During your preventive care visit, your health care provider may ask about your: Medical history, including: Past medical problems. Family medical history. History of falls. Current health, including: Emotional well-being. Home life and relationship well-being. Sexual activity. Memory and ability to understand (cognition). Lifestyle, including: Alcohol, nicotine or tobacco, and drug use. Access to firearms. Diet, exercise, and sleep habits. Work and work environment. Sunscreen use. Safety issues such as seatbelt and bike helmet use. Physical exam Your health care provider will check your: Height and weight. These may be used to calculate your BMI (body mass index). BMI is a measurement that tells if you are at a healthy weight. Waist circumference. This measures the distance around your waistline. This measurement also tells if you are at a healthy weight and may help predict your risk of certain diseases, such as type 2 diabetes and high blood pressure. Heart rate and blood pressure. Body temperature. Skin for abnormal spots. What immunizations do I need?  Vaccines are usually given at various ages, according to a schedule. Your health care provider will recommend vaccines for you based on your age, medical history, and lifestyle or other factors, such as travel or where you work. What tests do I need? Screening Your health care provider may recommend screening tests for certain conditions. This may include: Lipid and cholesterol levels. Diabetes screening. This is done by checking your blood sugar (glucose) after you have not eaten for a while (fasting). Hepatitis C test. Hepatitis B test. HIV (human  immunodeficiency virus) test. STI (sexually transmitted infection) testing, if you are at risk. Lung cancer screening. Colorectal cancer screening. Prostate cancer screening. Abdominal aortic aneurysm (AAA) screening. You may need this if you are a current or former smoker. Talk with your health care provider about your test results, treatment options, and if necessary, the need for more tests. Follow these instructions at home: Eating and drinking  Eat a diet that includes fresh fruits and vegetables, whole grains, lean protein, and low-fat dairy products. Limit your intake of foods with high amounts of sugar, saturated fats, and salt. Take vitamin and mineral supplements as recommended by your health care provider. Do not drink alcohol if your health care provider tells you not to drink. If you drink alcohol: Limit how much you have to 0-2 drinks a day. Know how much alcohol is in your drink. In the [place] one drink equals one 12 oz bottle of beer (355 mL), one 5 oz glass of wine (148 mL), or one 1 oz glass of hard liquor (44 mL). Lifestyle Brush your teeth every morning and night with fluoride toothpaste. Floss one time each day. Exercise for at least 30 minutes 5 or more days each week. Do not use any products that contain nicotine or tobacco. These products include cigarettes, chewing tobacco, and vaping devices, such as e-cigarettes. If you need help quitting, ask your health care provider. Do not use drugs. If you are sexually active, practice safe sex. Use a condom or other form of protection to prevent STIs. Take aspirin only as told by your health care provider. Make sure that you understand how much to take and what form to take. Work with your health care provider to find out whether it is safe   and beneficial for you to take aspirin daily. Ask your health care provider if you need to take a cholesterol-lowering medicine (statin). Find healthy ways to manage stress, such  as: Meditation, yoga, or listening to music. Journaling. Talking to a trusted person. Spending time with friends and family. Safety Always wear your seat belt while driving or riding in a vehicle. Do not drive: If you have been drinking alcohol. Do not ride with someone who has been drinking. When you are tired or distracted. While texting. If you have been using any mind-altering substances or drugs. Wear a helmet and other protective equipment during sports activities. If you have firearms in your house, make sure you follow all gun safety procedures. Minimize exposure to UV radiation to reduce your risk of skin cancer. What's next? Visit your health care provider once a year for an annual wellness visit. Ask your health care provider how often you should have your eyes and teeth checked. Stay up to date on all vaccines. This information is not intended to replace advice given to you by your health care provider. Make sure you discuss any questions you have with your health care provider. Document Revised: 01/04/2021 Document Reviewed: 01/04/2021 Elsevier Patient Education  2024 [employer] Inc[employer] g

## 2023-01-08 NOTE — Progress Notes (Unsigned)
Subjective:    Patient ID: Nathan Hess, male    DOB: August 26, 1950, 72 y.o.   MRN: [id number]  Chief Complaint  Patient presents with  . Annual Exam    Annual Exam     HPI Discussed the use of AI scribe software for clinical note transcription with the patient, who gave verbal consent to proceed.  History of Present Illness        Patient is a 72 year old male in today for annual preventative exam and follow up on chronic medical concerns. Denies CP/palp/SOB/HA/congestion/fevers/GI or GU c/o. Taking meds as prescribed     Past Medical History:  Diagnosis Date  . AAA (abdominal aortic aneurysm) (HCC) 10/06/2018  . Anxiety 09/24/2011  . Atopic dermatitis 11/26/2016  . BPH (benign prostatic hyperplasia) 08/26/2019  . Constipation 05/25/2019  . Costochondritis 04/03/2018  . Depression   . Diabetes mellitus type 2 in obese (HCC) 05/25/2012  . Enlarged prostate   . Erectile dysfunction 06/13/2010   Formatting of this note might be different from the original. Cialis 20 mg daily prn    Last Assessment & Plan:  Formatting of this note might be different from the original. Patient has good results with Cialis 20 mg as needed, but has run out. In the past, he tried Viagra with inadequate results. He was given a refill for #9 with 11 refills.  . Gallstone 10/06/2018  . Headache 04/10/2010   Formatting of this note might be different from the original. Sumatriptan 100 mg prn and hydrocodone/APAP 7.5/500 mg prn  ICD-10 cut over   Last Assessment & Plan:  Formatting of this note might be different from the original. Patient is having a migraine today and was given a sample of relpax 40 mg today because he isn't carrying the sumatriptan with him. He says that he is getting very few migra  . Hearing loss in left ear 01/08/2017  . History of colonic polyps 06/26/2015   3rd colonoscopy in 2016 with Dr Grayling Congress in [place]  . Hypercholesterolemia 04/11/2007   Formatting of this note might be different  from the original. Crestor 20 mg daily   Last Assessment & Plan:  Formatting of this note might be different from the original. Patient is taking Crestor 20 mg but has some compliance issues, missing it 2-3 times weekly. He says that he misses it because he can't find it and I have suggested that he use a med-minder. His lipids are at goal and I expect hi  . Hyperlipidemia 05/25/2012  . Hypogonadism male 09/24/2011  . Left knee DJD   . Lumbago 04/25/2014  . Migraine headache 08/03/2012  . Need for prophylactic vaccination and inoculation against influenza 05/13/2013  . Obesity 04/11/2007   Last Assessment & Plan:  Formatting of this note might be different from the original. Patient has gained about 22 pounds since he quit smoking 6 months ago. He is craving food more and says that he can't "fight two devils" at once. I have made a few suggestions about how he could reduce it.  . Other malaise and fatigue 11/01/2012  . Prediabetes 11/26/2016  . Preventative health care 09/22/2014  . Prostatic hypertrophy 04/10/2010   Formatting of this note might be different from the original. Finasteride 5 mg daily  Last Assessment & Plan:  Formatting of this note might be different from the original. Patient isn't having significant symptoms on the finasteride 5 mg daily.  . 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 Patient is having no pulmonary symptoms since he has quit smoking.  . Reduced libido 08/20/2011  . Salivary gland stone 02/28/2016  . Seborrheic keratoses 04/03/2018  . Tobacco use disorder 10/30/2012  . Type 2 diabetes mellitus (HCC) 04/10/2010   Last Assessment & Plan:  Formatting of this note might be different from the original. Patient has had IFG for years and hasn't been able to take my suggestions regarding weight loss and healthy eating. Blood work done last month shows a glucose that is now 129 and A1c  done today, POCT was 6.6%. I have told the patient that given his 22 pound weight gain since smoking cessation, this isn't surpris  . Unspecified essential hypertension 05/25/2012   Does not see a cardiologist    Past Surgical History:  Procedure Laterality Date  . left hand surgery    . PARTIAL KNEE ARTHROPLASTY Left 04/06/2013   Procedure: UNICOMPARTMENTAL LEFT KNEE;  Surgeon: Nilda Simmer, MD;  Location: [facility] OR;  Service: Orthopedics;  Laterality: Left;  . UMBILICAL HERNIA REPAIR  2017   w/ small ventral ex mesh    Family History  Problem Relation Age of Onset  . Alzheimer's disease Mother   . Hypertension Father   . Diabetes Brother        ?  Marland Kitchen Alcohol abuse Maternal Grandfather   . Cancer Paternal Grandmother   . Heart disease Paternal Grandfather        MI    Social History   Socioeconomic History  . Marital status: Married    Spouse name: Not on file  . Number of children: Not on file  . Years of education: Not on file  . Highest education level: Not on file  Occupational History  . Not on file  Tobacco Use  . Smoking status: Every Day    Packs/day: 1.00    Years: 15.00    Additional pack years: 0.00    Total pack years: 15.00    Types: Cigarettes  . Smokeless tobacco: Never  Vaping Use  . Vaping Use: Every day  Substance and Sexual Activity  . Alcohol use: Yes    Comment: 1 beer about every 2 months   . Drug use: No  . Sexual activity: Yes    Birth control/protection: Condom  Other Topics Concern  . Not on file  Social History Narrative  . Not on file   Social Determinants of Health   Financial Resource Strain: Not on file  Food Insecurity: Not on file  Transportation Needs: Not on file  Physical Activity: Not on file  Stress: Not on file  Social Connections: Not on file  Intimate Partner Violence: Not on file    Outpatient Medications Prior to Visit  Medication Sig Dispense Refill  . aspirin EC 81 MG tablet Take 1 tablet (81 mg total) by  mouth daily. 90 tablet 3  . busPIRone (BUSPAR) 5 MG tablet TAKE 1 TABLET BY MOUTH THREE TIMES A DAY 270 tablet 1  . metFORMIN (GLUCOPHAGE) 500 MG tablet Take 1 tablet (500 mg total) by mouth with breakfast, with lunch, and with evening meal. 270 tablet 1  . rosuvastatin (CRESTOR) 20 MG tablet Take 1 tablet (20 mg total) by mouth daily. 90 tablet 1  . tamsulosin (FLOMAX) 0.4 MG CAPS capsule TAKE 1 CAPSULE BY MOUTH EVERY DAY 90 capsule 1  . telmisartan-hydrochlorothiazide (MICARDIS HCT) 40-12.5 MG tablet TAKE  1 TABLET BY MOUTH DAILY. PATIENT NEEDS APPOINTMENT FOR FURTHER REFILLS. 1 ST ATTEMPT 30 tablet 0  . triamcinolone (KENALOG) 0.1 % Apply 1 application topically as needed. 80 g 5   No facility-administered medications prior to visit.    No Known Allergies  Review of Systems  Constitutional:  Negative for chills, fever and malaise/fatigue.  HENT:  Negative for congestion and hearing loss.   Eyes:  Negative for discharge.  Respiratory:  Negative for cough, sputum production and shortness of breath.   Cardiovascular:  Negative for chest pain, palpitations and leg swelling.  Gastrointestinal:  Negative for abdominal pain, blood in stool, constipation, diarrhea, heartburn, nausea and vomiting.  Genitourinary:  Negative for dysuria, frequency, hematuria and urgency.  Musculoskeletal:  Positive for joint pain. Negative for back pain, falls and myalgias.  Skin:  Negative for rash.  Neurological:  Negative for dizziness, sensory change, loss of consciousness, weakness and headaches.  Endo/Heme/Allergies:  Negative for environmental allergies. Does not bruise/bleed easily.  Psychiatric/Behavioral:  Negative for depression and suicidal ideas. The patient is not nervous/anxious and does not have insomnia.        Objective:    Physical Exam Vitals reviewed.  Constitutional:      General: He is not in acute distress.    Appearance: Normal appearance. He is not ill-appearing or diaphoretic.   HENT:     Head: Normocephalic and atraumatic.     Right Ear: Tympanic membrane, ear canal and external ear normal. There is no impacted cerumen.     Left Ear: Tympanic membrane, ear canal and external ear normal. There is no impacted cerumen.     Nose: Nose normal. No rhinorrhea.     Mouth/Throat:     Pharynx: Oropharynx is clear.  Eyes:     General: No scleral icterus.    Extraocular Movements: Extraocular movements intact.     Conjunctiva/sclera: Conjunctivae normal.     Pupils: Pupils are equal, round, and reactive to light.  Neck:     Thyroid: No thyroid mass or thyroid tenderness.  Cardiovascular:     Rate and Rhythm: Normal rate and regular rhythm.     Pulses: Normal pulses.     Heart sounds: Normal heart sounds. No murmur heard. Pulmonary:     Effort: Pulmonary effort is normal.     Breath sounds: Normal breath sounds. No wheezing.  Abdominal:     General: Bowel sounds are normal.     Palpations: Abdomen is soft. There is no mass.     Tenderness: There is no guarding.  Musculoskeletal:        General: No swelling. Normal range of motion.     Cervical back: Normal range of motion and neck supple. No rigidity.     Right lower leg: No edema.     Left lower leg: No edema.  Lymphadenopathy:     Cervical: No cervical adenopathy.  Skin:    General: Skin is warm and dry.     Findings: No rash.  Neurological:     General: No focal deficit present.     Mental Status: He is alert and oriented to person, place, and time.     Cranial Nerves: No cranial nerve deficit.     Deep Tendon Reflexes: Reflexes normal.  Psychiatric:        Mood and Affect: Mood normal.        Behavior: Behavior normal.   BP 126/74 (BP Location: Left Arm, Patient Position: Sitting, Cuff Size: Normal)  Pulse 74   Temp 97.8 F (36.6 C) (Oral)   Resp 16   Ht 5\' 11"  (1.803 m)   Wt 223 lb 3.2 oz (101.2 kg)   SpO2 96%   BMI 31.13 kg/m  Wt Readings from Last 3 Encounters:  01/08/23 223 lb 3.2 oz  (101.2 kg)  06/28/22 213 lb 6.4 oz (96.8 kg)  10/24/21 226 lb 3.2 oz (102.6 kg)    Diabetic Foot Exam - Simple   No data filed    Lab Results  Component Value Date   WBC 7.3 06/28/2022   HGB 15.2 06/28/2022   HCT 43.2 06/28/2022   PLT 227.0 06/28/2022   GLUCOSE 120 (H) 06/28/2022   CHOL 141 06/28/2022   TRIG 76.0 06/28/2022   HDL 44.80 06/28/2022   LDLCALC 81 06/28/2022   ALT 22 06/28/2022   AST 16 06/28/2022   NA 138 06/28/2022   K 4.8 06/28/2022   CL 102 06/28/2022   CREATININE 0.82 06/28/2022   BUN 12 06/28/2022   CO2 30 06/28/2022   TSH 1.57 06/28/2022   PSA 2.79 04/06/2021   INR 1.00 03/30/2013   HGBA1C 6.9 (H) 06/28/2022   MICROALBUR 1.0 06/28/2022    Lab Results  Component Value Date   TSH 1.57 06/28/2022   Lab Results  Component Value Date   WBC 7.3 06/28/2022   HGB 15.2 06/28/2022   HCT 43.2 06/28/2022   MCV 94.1 06/28/2022   PLT 227.0 06/28/2022   Lab Results  Component Value Date   NA 138 06/28/2022   K 4.8 06/28/2022   CO2 30 06/28/2022   GLUCOSE 120 (H) 06/28/2022   BUN 12 06/28/2022   CREATININE 0.82 06/28/2022   BILITOT 0.7 06/28/2022   ALKPHOS 78 06/28/2022   AST 16 06/28/2022   ALT 22 06/28/2022   PROT 7.3 06/28/2022   ALBUMIN 4.8 06/28/2022   CALCIUM 9.4 06/28/2022   GFR 88.20 06/28/2022   Lab Results  Component Value Date   CHOL 141 06/28/2022   Lab Results  Component Value Date   HDL 44.80 06/28/2022   Lab Results  Component Value Date   LDLCALC 81 06/28/2022   Lab Results  Component Value Date   TRIG 76.0 06/28/2022   Lab Results  Component Value Date   CHOLHDL 3 06/28/2022   Lab Results  Component Value Date   HGBA1C 6.9 (H) 06/28/2022       Assessment & Plan:  Type 2 diabetes mellitus with hyperglycemia, unspecified whether long term insulin use (HCC) Assessment & Plan: hgba1c acceptable, minimize simple carbs. Increase exercise as tolerated. Continue current meds. He reports his last hgba1c at work  was 6.8  Orders: -     Hemoglobin A1c  Hypercholesterolemia Assessment & Plan: Tolerating statin, encouraged heart healthy diet, avoid trans fats, minimize simple carbs and saturated fats. Increase exercise as tolerated  Orders: -     Lipid panel  Hypertension, unspecified type Assessment & Plan: Well controlled, no changes to meds. Encouraged heart healthy diet such as the DASH diet and exercise as tolerated.   Orders: -     CBC with Differential/Platelet -     Comprehensive metabolic panel -     TSH  Preventative health care Assessment & Plan: Patient encouraged to maintain heart healthy diet, regular exercise, adequate sleep. Consider daily probiotics. Take medications as prescribed. Labs ordered and reviewed. Colonoscopy 01/2020 repeat in 2026. Given and reviewed copy of ACP documents from [employer] and encouraged to  complete and return    Elevated PSA Assessment & Plan: Will be monitored  Orders: -     PSA  Right knee pain, unspecified chronicity Assessment & Plan: Continues to get intermittent pain that is sharp in the right knee after quick turns. Encouraged moist heat and gentle stretching as tolerated. May try NSAIDs and prescription meds as directed and report if symptoms worsen or seek immediate care    Obesity, unspecified classification, unspecified obesity type, unspecified whether serious comorbidity present Assessment & Plan: Has some weight loss last year with walking but then stopped during winter has started walking again. Encouraged DASH or MIND diet, decrease po intake and increase exercise as tolerated. Needs 7-8 hours of sleep nightly. Avoid trans fats, eat small, frequent meals every 4-5 hours with lean proteins, complex carbs and healthy fats. Minimize simple carbs, high fat foods and processed foods    Tobacco use disorder Assessment & Plan: Had a repeat low dose CT scan yesterday     Assessment and Plan               [name], MD

## 2023-01-08 NOTE — Assessment & Plan Note (Signed)
Will be monitored

## 2023-01-08 NOTE — Assessment & Plan Note (Signed)
Well controlled, no changes to meds. Encouraged heart healthy diet such as the DASH diet and exercise as tolerated.  °

## 2023-01-08 NOTE — Assessment & Plan Note (Addendum)
Had a repeat low dose CT scan yesterday, smokes a PPD still believes he wants to quit but still does not feel ready .

## 2023-01-08 NOTE — Assessment & Plan Note (Signed)
Has some weight loss last year with walking but then stopped during winter has started walking again. Encouraged DASH or MIND diet, decrease po intake and increase exercise as tolerated. Needs 7-8 hours of sleep nightly. Avoid trans fats, eat small, frequent meals every 4-5 hours with lean proteins, complex carbs and healthy fats. Minimize simple carbs, high fat foods and processed foods

## 2023-01-08 NOTE — Assessment & Plan Note (Signed)
Continues to get intermittent pain that is sharp in the right knee after quick turns. Encouraged moist heat and gentle stretching as tolerated. May try NSAIDs and prescription meds as directed and report if symptoms worsen or seek immediate care

## 2023-01-09 ENCOUNTER — Other Ambulatory Visit: Payer: Self-pay

## 2023-01-09 DIAGNOSIS — Z87891 Personal history of nicotine dependence: Secondary | ICD-10-CM

## 2023-01-09 DIAGNOSIS — F17210 Nicotine dependence, cigarettes, uncomplicated: Secondary | ICD-10-CM

## 2023-01-09 DIAGNOSIS — Z122 Encounter for screening for malignant neoplasm of respiratory organs: Secondary | ICD-10-CM

## 2023-01-09 MED ORDER — METFORMIN HCL 500 MG PO TABS
500.0000 mg | ORAL_TABLET | Freq: Four times a day (QID) | ORAL | 5 refills | Status: DC
Start: 2023-01-09 — End: 2024-01-09

## 2023-02-09 ENCOUNTER — Other Ambulatory Visit: Payer: Self-pay | Admitting: Family Medicine

## 2023-02-09 DIAGNOSIS — E785 Hyperlipidemia, unspecified: Secondary | ICD-10-CM

## 2023-02-09 DIAGNOSIS — E1169 Type 2 diabetes mellitus with other specified complication: Secondary | ICD-10-CM

## 2023-02-09 DIAGNOSIS — F32A Depression, unspecified: Secondary | ICD-10-CM

## 2023-02-09 DIAGNOSIS — M545 Low back pain, unspecified: Secondary | ICD-10-CM

## 2023-02-09 DIAGNOSIS — I10 Essential (primary) hypertension: Secondary | ICD-10-CM

## 2023-02-09 DIAGNOSIS — E669 Obesity, unspecified: Secondary | ICD-10-CM

## 2023-02-09 DIAGNOSIS — F17200 Nicotine dependence, unspecified, uncomplicated: Secondary | ICD-10-CM

## 2023-04-21 NOTE — Progress Notes (Unsigned)
Cardiology Office Note:    Date:  04/22/2023   ID:  Nathan Hess, DOB Nov 23, 1950, MRN [id number]  PCP:  Bradd Canary, MD  Cardiologist:  Norman Herrlich, MD    Referring MD: Bradd Canary, MD    ASSESSMENT:    1. Abdominal aortic aneurysm (AAA) without rupture, unspecified part (HCC)   2. Essential hypertension   3. Mixed hyperlipidemia    PLAN:    In order of problems listed above:  I suspect that his chronic back pain is the issue however he has an aortic aneurysm we will send him for CT today and will intensify follow-up he will have a duplex done in 6 months before his next visit Stable well-controlled hypertension continue his ARB thiazide diuretic Good lipid control continue his high intensity statin Stable diabetes managed with his PCP   Next appointment: 6 months after duplex performed   Medication Adjustments/Labs and Tests Ordered: Current medicines are reviewed at length with the patient today.  Concerns regarding medicines are outlined above.  Orders Placed This Encounter  Procedures   CT ABDOMEN PELVIS W WO CONTRAST   EKG 12-Lead   No orders of the defined types were placed in this encounter.    History of Present Illness:    Nathan Hess is a 72 y.o. male with a hx of abdominal aortic aneurysm maximum diameter 37 mm October 2023 coronary calcification on CT scan hyperlipidemia hypertension and type 2 diabetes last seen 07/25/2021.  He has CT lung cancer screening June 2024 confirmed coronary artery and aortic atherosclerosis and calcification but no finding of enlargement of the thoracic aorta.  Recent lipid profile with a cholesterol 141 non-HDL cholesterol 96 LDL 81 HDL 45 triglycerides 76 with rosuvastatin.  EKG Interpretation Date/Time:  Monday April 22 2023 08:11:58 EDT Ventricular Rate:  76 PR Interval:  166 QRS Duration:  76 QT Interval:  364 QTC Calculation: 409 R Axis:   72  Text Interpretation: Normal sinus rhythm  Normal ECG When compared with ECG of 30-Mar-2013 12:04, No significant change was found Confirmed by Norman Herrlich ([id number]) on 04/22/2023 8:14:21 AM    Compliance with diet, lifestyle and medications: Yes  He has a long history of low back pain occurs sporadically and related to lifting. He did lifting yesterday onset of back pain this morning about 5:00 is positional sharp in nature and similar to what he is has a chronic pattern He also has an abdominal aortic aneurysm will have a CT scan done today He is not weak no nausea or vomiting no abdominal pain chest pain shortness of breath palpitation or syncope. Recent labs 01/08/2023 cholesterol 140 LDL 78 A1c 7.4% hemoglobin 14.7 creatinine 0.83 potassium 4.4 Past Medical History:  Diagnosis Date   AAA (abdominal aortic aneurysm) (HCC) 10/06/2018   Anxiety 09/24/2011   Atopic dermatitis 11/26/2016   BPH (benign prostatic hyperplasia) 08/26/2019   Constipation 05/25/2019   Costochondritis 04/03/2018   Depression    Diabetes mellitus type 2 in obese 05/25/2012   Enlarged prostate    Erectile dysfunction 06/13/2010   Formatting of this note might be different from the original. Cialis 20 mg daily prn    Last Assessment & Plan:  Formatting of this note might be different from the original. Patient has good results with Cialis 20 mg as needed, but has run out. In the past, he tried Viagra with inadequate results. He was given a refill for #9 with 11 refills.   Gallstone 10/06/2018  Headache 04/10/2010   Formatting of this note might be different from the original. Sumatriptan 100 mg prn and hydrocodone/APAP 7.5/500 mg prn  ICD-10 cut over   Last Assessment & Plan:  Formatting of this note might be different from the original. Patient is having a migraine today and was given a sample of relpax 40 mg today because he isn't carrying the sumatriptan with him. He says that he is getting very few migra   Hearing loss in left ear 01/08/2017   History of colonic polyps  06/26/2015   3rd colonoscopy in 2016 with Dr Grayling Congress in [place]   Hypercholesterolemia 04/11/2007   Formatting of this note might be different from the original. Crestor 20 mg daily   Last Assessment & Plan:  Formatting of this note might be different from the original. Patient is taking Crestor 20 mg but has some compliance issues, missing it 2-3 times weekly. He says that he misses it because he can't find it and I have suggested that he use a med-minder. His lipids are at goal and I expect hi   Hyperlipidemia 05/25/2012   Hypogonadism male 09/24/2011   Left knee DJD    Lumbago 04/25/2014   Migraine headache 08/03/2012   Need for prophylactic vaccination and inoculation against influenza 05/13/2013   Obesity 04/11/2007   Last Assessment & Plan:  Formatting of this note might be different from the original. Patient has gained about 22 pounds since he quit smoking 6 months ago. He is craving food more and says that he can't "fight two devils" at once. I have made a few suggestions about how he could reduce it.   Other malaise and fatigue 11/01/2012   Prediabetes 11/26/2016   Preventative health care 09/22/2014   Prostatic hypertrophy 04/10/2010   Formatting of this note might be different from the original. Finasteride 5 mg daily  Last Assessment & Plan:  Formatting of this note might be different from the original. Patient isn't having significant symptoms on the finasteride 5 mg daily.   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 Patient is having no pulmonary symptoms since he has quit smoking.   Reduced libido 08/20/2011   Salivary gland stone 02/28/2016   Seborrheic keratoses 04/03/2018   Tobacco use disorder 10/30/2012   Type 2 diabetes mellitus (HCC) 04/10/2010   Last Assessment & Plan:  Formatting of this note might be different from the original. Patient has had IFG for years and hasn't been able to  take my suggestions regarding weight loss and healthy eating. Blood work done last month shows a glucose that is now 129 and A1c done today, POCT was 6.6%. I have told the patient that given his 22 pound weight gain since smoking cessation, this isn't surpris   Unspecified essential hypertension 05/25/2012   Does not see a cardiologist    Current Medications: Current Meds  Medication Sig   aspirin EC 81 MG tablet Take 1 tablet (81 mg total) by mouth daily.   busPIRone (BUSPAR) 5 MG tablet TAKE 1 TABLET BY MOUTH THREE TIMES A DAY   metFORMIN (GLUCOPHAGE) 500 MG tablet Take 1 tablet (500 mg total) by mouth 4 (four) times daily. Take 4 tabs a day , take 2 in am, one in afternoon and 1 in pm.   rosuvastatin (CRESTOR) 20 MG tablet TAKE 1 TABLET BY MOUTH EVERY DAY   tamsulosin (FLOMAX) 0.4 MG CAPS  capsule TAKE 1 CAPSULE BY MOUTH EVERY DAY   telmisartan-hydrochlorothiazide (MICARDIS HCT) 40-12.5 MG tablet TAKE 1 TABLET BY MOUTH DAILY. PATIENT NEEDS APPOINTMENT FOR FURTHER REFILLS. 1 ST ATTEMPT   triamcinolone (KENALOG) 0.1 % Apply 1 application topically as needed.      EKGs/Labs/Other Studies Reviewed:    The following studies were reviewed today:      EKG Interpretation Date/Time:  Monday April 22 2023 08:11:58 EDT Ventricular Rate:  76 PR Interval:  166 QRS Duration:  76 QT Interval:  364 QTC Calculation: 409 R Axis:   72  Text Interpretation: Normal sinus rhythm Normal ECG When compared with ECG of 30-Mar-2013 12:04, No significant change was found Confirmed by Norman Herrlich ([id number]) on 04/22/2023 8:14:21 AM   Recent Labs: 01/08/2023: ALT 24; BUN 21; Creatinine, Ser 0.83; Hemoglobin 14.7; Platelets 222.0; Potassium 4.4; Sodium 135; TSH 1.83  Recent Lipid Panel    Component Value Date/Time   CHOL 140 01/08/2023 0945   TRIG 106.0 01/08/2023 0945   HDL 40.80 01/08/2023 0945   CHOLHDL 3 01/08/2023 0945   VLDL 21.2 01/08/2023 0945   LDLCALC 78 01/08/2023 0945    Physical Exam:     VS:  BP 118/70 (BP Location: Left Arm, Patient Position: Sitting, Cuff Size: Normal)   Pulse 76   Ht 5\' 11"  (1.803 m)   Wt 276 lb (125.2 kg)   SpO2 96%   BMI 38.49 kg/m     Wt Readings from Last 3 Encounters:  04/22/23 276 lb (125.2 kg)  01/08/23 223 lb 3.2 oz (101.2 kg)  06/28/22 213 lb 6.4 oz (96.8 kg)     GEN: He does not look acutely ill or pale well nourished, well developed in no acute distress HEENT: Normal NECK: No JVD; No carotid bruits LYMPHATICS: No lymphadenopathy CARDIAC: RRR, no murmurs, rubs, gallops RESPIRATORY:  Clear to auscultation without rales, wheezing or rhonchi  ABDOMEN: Soft, non-tender, non-distended MUSCULOSKELETAL:  No edema; No deformity  SKIN: Warm and dry NEUROLOGIC:  Alert and oriented x 3 PSYCHIATRIC:  Normal affect    Signed, Norman Herrlich, MD  04/22/2023 8:54 AM     [facility]

## 2023-04-22 ENCOUNTER — Ambulatory Visit: Payer: Medicare (Managed Care) | Attending: Cardiology | Admitting: Cardiology

## 2023-04-22 ENCOUNTER — Encounter: Payer: Self-pay | Admitting: Cardiology

## 2023-04-22 VITALS — BP 118/70 | HR 76 | Ht 71.0 in | Wt 276.0 lb

## 2023-04-22 DIAGNOSIS — I10 Essential (primary) hypertension: Secondary | ICD-10-CM

## 2023-04-22 DIAGNOSIS — E782 Mixed hyperlipidemia: Secondary | ICD-10-CM | POA: Diagnosis not present

## 2023-04-22 DIAGNOSIS — I714 Abdominal aortic aneurysm, without rupture, unspecified: Secondary | ICD-10-CM

## 2023-04-22 LAB — LAB REPORT - SCANNED: EGFR: 60

## 2023-04-22 NOTE — Patient Instructions (Signed)
Medication Instructions:  Your physician recommends that you continue on your current medications as directed. Please refer to the Current Medication list given to you today.  *If you need a refill on your cardiac medications before your next appointment, please call your pharmacy*   Lab Work: None If you have labs (blood work) drawn today and your tests are completely normal, you will receive your results only by: MyChart Message (if you have MyChart) OR A paper copy in the mail If you have any lab test that is abnormal or we need to change your treatment, we will call you to review the results.   Testing/Procedures: CT-scan of abdomen and pelvis    Follow-Up: At [facility], you and your health needs are our priority.  As part of our continuing mission to provide you with exceptional heart care, we have created designated Provider Care Teams.  These Care Teams include your primary Cardiologist (physician) and Advanced Practice Providers (APPs -  Physician Assistants and Nurse Practitioners) who all work together to provide you with the care you need, when you need it.  We recommend signing up for the patient portal called "MyChart".  Sign up information is provided on this After Visit Summary.  MyChart is used to connect with patients for Virtual Visits (Telemedicine).  Patients are able to view lab/test results, encounter notes, upcoming appointments, etc.  Non-urgent messages can be sent to your provider as well.   To learn more about what you can do with MyChart, go to [email or web address].    Your next appointment:   6 month(s)  Provider:   Norman Herrlich, MD    Other Instructions None

## 2023-04-23 ENCOUNTER — Ambulatory Visit: Payer: Medicare (Managed Care) | Admitting: Family

## 2023-04-23 ENCOUNTER — Encounter: Payer: Self-pay | Admitting: Cardiology

## 2023-04-25 ENCOUNTER — Telehealth: Payer: Self-pay | Admitting: Cardiology

## 2023-04-25 ENCOUNTER — Other Ambulatory Visit: Payer: Self-pay

## 2023-04-25 ENCOUNTER — Ambulatory Visit: Payer: Medicare (Managed Care)

## 2023-04-25 NOTE — Addendum Note (Signed)
Addended by: Roxanne Mins I on: 04/25/2023 03:45 PM   Modules accepted: Orders

## 2023-04-25 NOTE — Telephone Encounter (Signed)
Spoke to Dr. Dulce Sellar to find out if the patient needed to have his ultrasound of his abdominal aorta after having a CT of his abdomen and pelvis. Dr. Dulce Sellar stated that he did not need to have the ultrasound. Called the patient and informed him that he did not need to have his ultrasound. Patient verbalized understanding and had no further questions at this time.

## 2023-04-25 NOTE — Telephone Encounter (Signed)
Patient says he is agreeable to schedule [place] (AAA) if needed, but wanted to know if it was still necessary after his ABD/Pelvic CT and has his aneurysm shown any growth?  Please advise. Best number for patient is [phone]

## 2023-05-14 DIAGNOSIS — M6281 Muscle weakness (generalized): Secondary | ICD-10-CM | POA: Insufficient documentation

## 2023-05-14 HISTORY — DX: Muscle weakness (generalized): M62.81

## 2023-05-29 ENCOUNTER — Encounter: Payer: Self-pay | Admitting: Cardiology

## 2023-05-31 ENCOUNTER — Encounter: Payer: Self-pay | Admitting: Cardiology

## 2023-06-17 ENCOUNTER — Encounter: Payer: Self-pay | Admitting: Cardiology

## 2023-07-23 DIAGNOSIS — M5417 Radiculopathy, lumbosacral region: Secondary | ICD-10-CM | POA: Diagnosis not present

## 2023-07-24 DIAGNOSIS — I2510 Atherosclerotic heart disease of native coronary artery without angina pectoris: Secondary | ICD-10-CM

## 2023-07-24 HISTORY — DX: Atherosclerotic heart disease of native coronary artery without angina pectoris: I25.10

## 2023-08-15 ENCOUNTER — Other Ambulatory Visit: Payer: Self-pay | Admitting: Cardiology

## 2023-08-20 LAB — HM DIABETES EYE EXAM

## 2023-09-08 IMAGING — CT CT CHEST LUNG CANCER SCREENING LOW DOSE W/O CM
1 series · 10 of 10 positions shown, 13 images · non-contrast
Comparison: 10/06/2020.

CLINICAL DATA: Current smoker, 62 pack-year history.



[ct lung segmentation data · axial · 0.78mm/px · z∈[-366,-366]mm · 10 of 329 frames shown]
[frame 1/329  mediastinal]
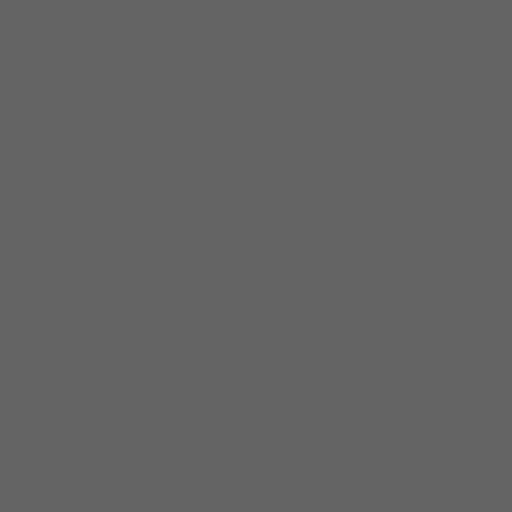
[frame 1/329  lung]
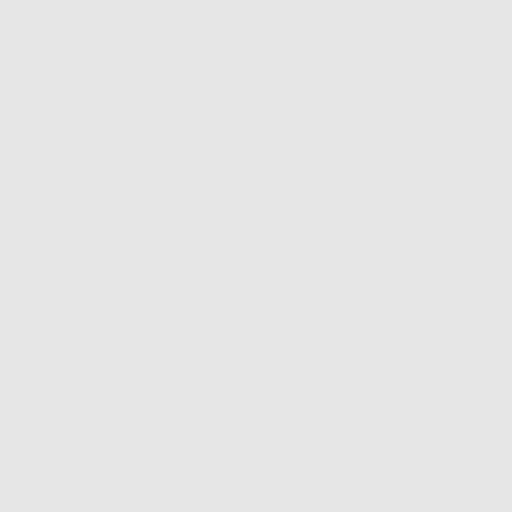
[frame 37/329  lung]
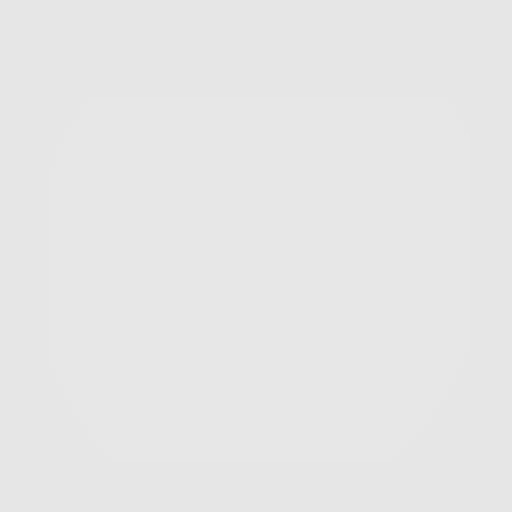
[frame 73/329  lung]
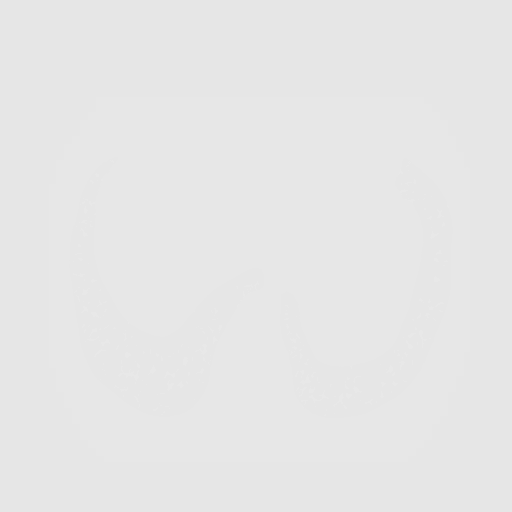
[frame 110/329  lung]
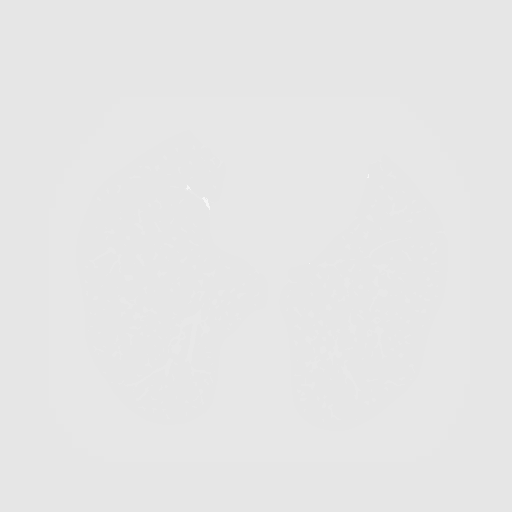
[frame 146/329  mediastinal]
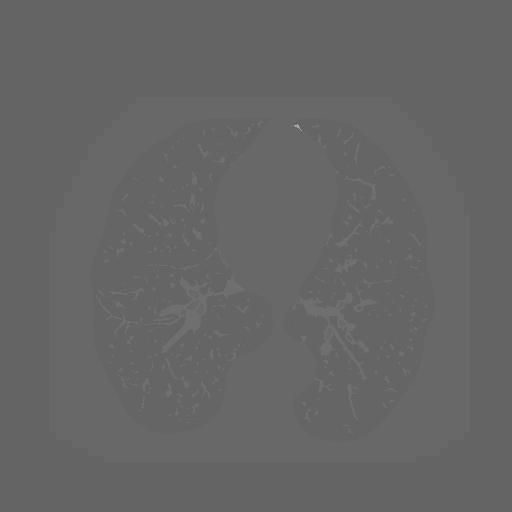
[frame 146/329  lung]
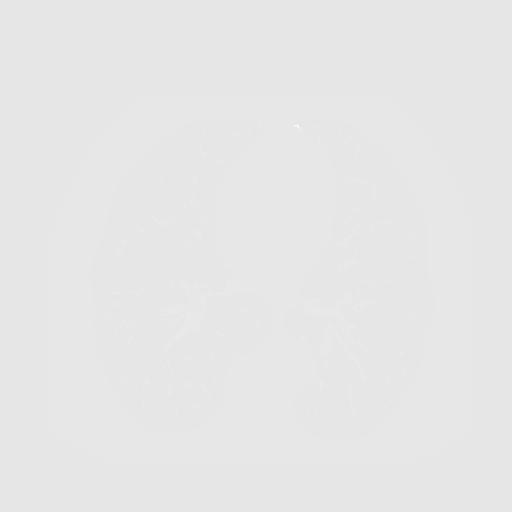
[frame 183/329  lung]
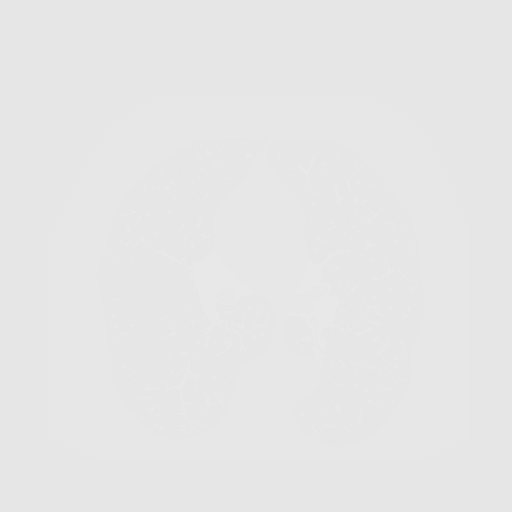
[frame 219/329  lung]
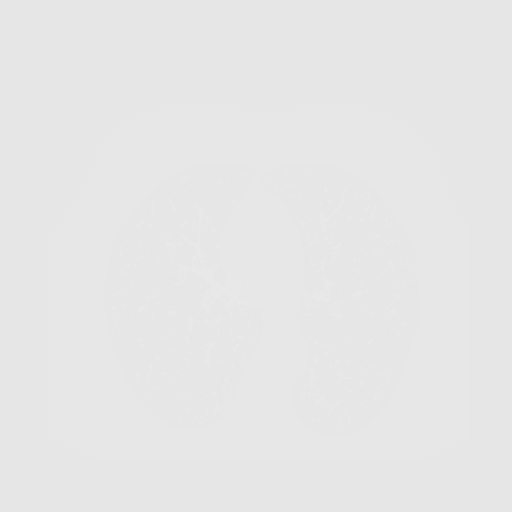
[frame 256/329  lung]
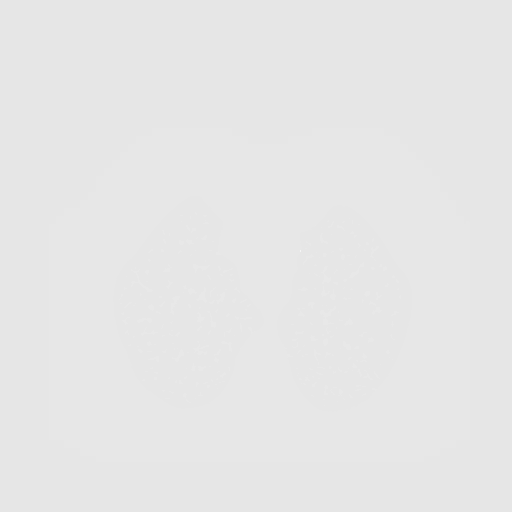
[frame 292/329  mediastinal]
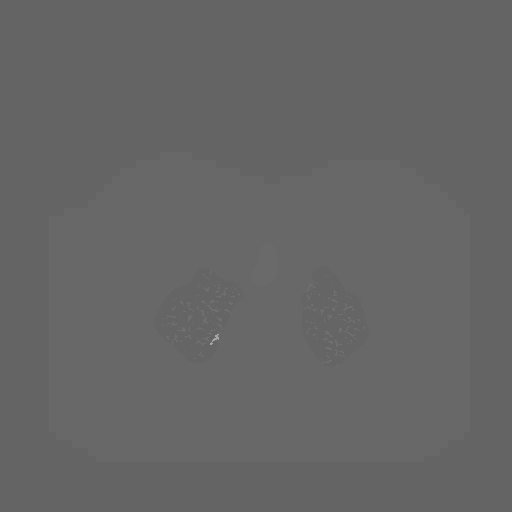
[frame 292/329  lung]
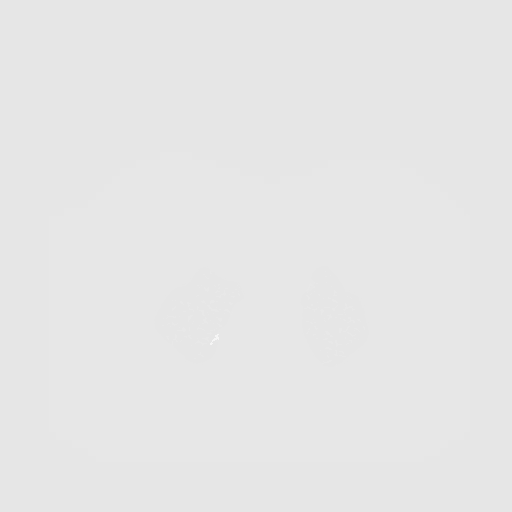
[frame 329/329  lung]
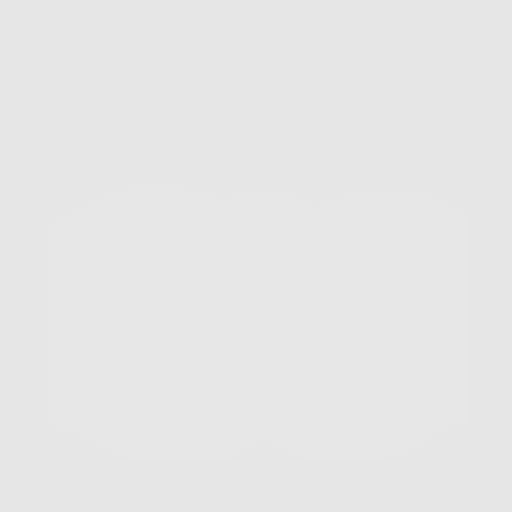

[10 of 10 positions shown; findings below may reference images not displayed]

FINDINGS: Cardiovascular: Atherosclerotic calcification of the aorta. Heart
size normal. No pericardial effusion.

Mediastinum/Nodes: No pathologically enlarged mediastinal or
axillary lymph nodes. Hilar regions are difficult to definitively
evaluate without IV contrast. Esophagus is grossly unremarkable.

Lungs/Pleura: Mild centrilobular emphysema. Smoking related
respiratory bronchiolitis. Minimal scattered mucoid impaction.
Pulmonary nodules measure 5.2 mm or less in size. No new or
suspicious pulmonary nodules. No pleural fluid. Adherent debris in
the airway.

Upper Abdomen: Visualized portion of the liver is unremarkable.
Large stone in the gallbladder. Right adrenal gland is unremarkable.
Nodular thickening of the left adrenal gland. No significant
follow-up necessary. Visualized portions of the left kidney, spleen,
pancreas, stomach and bowel are grossly unremarkable.

Musculoskeletal: Degenerative changes in the spine. No worrisome
lytic or sclerotic lesions.
IMPRESSION: 1. Lung-RADS 2, benign appearance or behavior. Continue annual
screening with low-dose chest CT without contrast in 12 months.
2. Cholelithiasis.
3.  Aortic atherosclerosis (OQ3E8-8IS.S).
4.  Emphysema (OQ3E8-IMZ.E).

## 2023-10-02 ENCOUNTER — Encounter: Payer: Self-pay | Admitting: Cardiology

## 2023-10-02 DIAGNOSIS — R1011 Right upper quadrant pain: Secondary | ICD-10-CM

## 2023-10-02 DIAGNOSIS — M1990 Unspecified osteoarthritis, unspecified site: Secondary | ICD-10-CM

## 2023-10-02 DIAGNOSIS — R079 Chest pain, unspecified: Secondary | ICD-10-CM

## 2023-10-02 DIAGNOSIS — R12 Heartburn: Secondary | ICD-10-CM

## 2023-10-02 HISTORY — DX: Right upper quadrant pain: R10.11

## 2023-10-02 HISTORY — DX: Unspecified osteoarthritis, unspecified site: M19.90

## 2023-10-02 HISTORY — DX: Heartburn: R12

## 2023-10-02 HISTORY — DX: Chest pain, unspecified: R07.9

## 2023-10-02 NOTE — Progress Notes (Unsigned)
 Cardiology Office Note:    Date:  10/03/2023   ID:  Nathan Hess, DOB August 11, 1950, MRN [id number]  PCP:  Bradd Canary, MD  Cardiologist:  Norman Herrlich, MD    Referring MD: Bradd Canary, MD    ASSESSMENT:    1. Abdominal aortic aneurysm (AAA) without rupture, unspecified part (HCC)   2. Essential hypertension   3. Mixed hyperlipidemia    PLAN:    In order of problems listed above:  Detailed discussion about the diagnosis natural history and intervention for abdominal aortic aneurysm We will recheck had a shorter interval 6 months because of progression on the last 2 scheduled in my office  Continue aspirin lipid-lowering therapy beneficial for aortic health Hypertension is well-controlled   Next appointment: 1 year   Medication Adjustments/Labs and Tests Ordered: Current medicines are reviewed at length with the patient today.  Concerns regarding medicines are outlined above.  No orders of the defined types were placed in this encounter.  No orders of the defined types were placed in this encounter.    History of Present Illness:    Nathan Hess is a 73 y.o. male with a hx of abdominal aortic aneurysm most recently 3.7 cm hypertension hyperlipidemia and type 2 diabetes last seen 04/22/2023  Quite concerned regarding abdominal aortic aneurysm (mother recently died of?  Dissection?  Thoracic aortic aneurysm He is due for follow-up duplex April He stopped taking aspirin I asked her to restart He continues lipid-lowering therapy his most recent profile 01/08/2023 is an LDL 78 cholesterol 140 Blood pressure is well-controlled today my office 128/64 Having no abdominal pain edema shortness of breath chest pain palpitation or syncope  Compliance with diet, lifestyle and medications: Yes Past Medical History:  Diagnosis Date   AAA (abdominal aortic aneurysm) (HCC) 10/06/2018   Acid reflux 06/27/2021   Anxiety 09/24/2011   Arthritis 10/02/2023    Atopic dermatitis 11/26/2016   Benign neoplasm of sigmoid colon 10/29/2014   BPH (benign prostatic hyperplasia) 08/26/2019   Constipation 05/25/2019   Costochondritis 04/03/2018   Depression    Diabetes mellitus type 2 in obese 05/25/2012   Elevated PSA 04/06/2021   Enlarged prostate    Erectile dysfunction 06/13/2010   Formatting of this note might be different from the original. Cialis 20 mg daily prn    Last Assessment & Plan:  Formatting of this note might be different from the original. Patient has good results with Cialis 20 mg as needed, but has run out. In the past, he tried Viagra with inadequate results. He was given a refill for #9 with 11 refills.   Fatty liver 10/24/2021   Gallstone 10/06/2018   Headache 04/10/2010   Formatting of this note might be different from the original. Sumatriptan 100 mg prn and hydrocodone/APAP 7.5/500 mg prn  ICD-10 cut over   Last Assessment & Plan:  Formatting of this note might be different from the original. Patient is having a migraine today and was given a sample of relpax 40 mg today because he isn't carrying the sumatriptan with him. He says that he is getting very few migra   Hearing loss in left ear 01/08/2017   Heartburn 10/02/2023   History of colonic polyps 06/26/2015   3rd colonoscopy in 2016 with Dr Grayling Congress in [place]   Hypercholesterolemia 04/11/2007   Formatting of this note might be different from the original. Crestor 20 mg daily   Last Assessment & Plan:  Formatting of this note might be  different from the original. Patient is taking Crestor 20 mg but has some compliance issues, missing it 2-3 times weekly. He says that he misses it because he can't find it and I have suggested that he use a med-minder. His lipids are at goal and I expect hi   Hyperlipidemia 05/25/2012   Hypertension 09/24/2011   Has been off of Lisinopril for the past week. Would restart Lisinopril 5 mg po daily     Hypogonadism male 09/24/2011   Insomnia  09/27/2020   Left knee DJD    Lumbago 04/25/2014   Migraine headache 08/03/2012   Muscle weakness 05/14/2023   Need for prophylactic vaccination and inoculation against influenza 05/13/2013   Obesity 04/11/2007   Last Assessment & Plan:  Formatting of this note might be different from the original. Patient has gained about 22 pounds since he quit smoking 6 months ago. He is craving food more and says that he can't "fight two devils" at once. I have made a few suggestions about how he could reduce it.   Other diseases of stomach and duodenum 07/03/2021   Other malaise and fatigue 11/01/2012   Polyp of colon 02/10/2020   Prediabetes 11/26/2016   Preventative health care 09/22/2014   Prostatic hypertrophy 04/10/2010   Formatting of this note might be different from the original. Finasteride 5 mg daily  Last Assessment & Plan:  Formatting of this note might be different from the original. Patient isn't having significant symptoms on the finasteride 5 mg daily.   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 Patient is having no pulmonary symptoms since he has quit smoking.   Reduced libido 08/20/2011   Restless sleeper 09/28/2020   Right knee pain 06/27/2021   Right-sided chest pain 10/02/2023   RUQ pain 10/02/2023   Salivary gland stone 02/28/2016   Seborrheic keratoses 04/03/2018   Sun-damaged skin 09/28/2020   Tiredness 04/25/2014   Tobacco use disorder 10/30/2012   Type 2 diabetes mellitus (HCC) 04/10/2010   Last Assessment & Plan:  Formatting of this note might be different from the original. Patient has had IFG for years and hasn't been able to take my suggestions regarding weight loss and healthy eating. Blood work done last month shows a glucose that is now 129 and A1c done today, POCT was 6.6%. I have told the patient that given his 22 pound weight gain since smoking cessation, this  isn't surpris   Unspecified essential hypertension 05/25/2012   Does not see a cardiologist    Current Medications: Current Meds  Medication Sig   albuterol (VENTOLIN HFA) 108 (90 Base) MCG/ACT inhaler Inhale 2 puffs into the lungs every 4 (four) hours as needed for wheezing or shortness of breath.   aspirin EC 81 MG tablet Take 1 tablet (81 mg total) by mouth daily.   busPIRone (BUSPAR) 5 MG tablet TAKE 1 TABLET BY MOUTH THREE TIMES A DAY (Patient taking differently: Take 5 mg by mouth 3 (three) times daily.)   ciprofloxacin (CIPRO) 250 MG tablet Take 250 mg by mouth 2 (two) times daily.   finasteride (PROSCAR) 5 MG tablet Take 5 mg by mouth daily.   ibuprofen (ADVIL) 600 MG tablet Take 600 mg by mouth every 12 (twelve) hours as needed for mild pain (pain score 1-3) or moderate pain (pain score 4-6).   metFORMIN (GLUCOPHAGE) 500 MG tablet Take 1 tablet (500 mg total) by mouth  4 (four) times daily. Take 4 tabs a day , take 2 in am, one in afternoon and 1 in pm.   rosuvastatin (CRESTOR) 20 MG tablet TAKE 1 TABLET BY MOUTH EVERY DAY   tamsulosin (FLOMAX) 0.4 MG CAPS capsule TAKE 1 CAPSULE BY MOUTH EVERY DAY (Patient taking differently: Take 0.4 mg by mouth daily after supper.)   telmisartan-hydrochlorothiazide (MICARDIS HCT) 40-12.5 MG tablet Take 1 tablet by mouth daily.   terbinafine (LAMISIL) 250 MG tablet Take 250 mg by mouth daily.   triamcinolone (KENALOG) 0.1 % Apply 1 application topically as needed. (Patient taking differently: Apply 1 application  topically as needed (rash).)      EKGs/Labs/Other Studies Reviewed:    The following studies were reviewed today:          Recent Labs: 01/08/2023: ALT 24; BUN 21; Creatinine, Ser 0.83; Hemoglobin 14.7; Platelets 222.0; Potassium 4.4; Sodium 135; TSH 1.83  Recent Lipid Panel    Component Value Date/Time   CHOL 140 01/08/2023 0945   TRIG 106.0 01/08/2023 0945   HDL 40.80 01/08/2023 0945   CHOLHDL 3 01/08/2023 0945   VLDL 21.2  01/08/2023 0945   LDLCALC 78 01/08/2023 0945    Physical Exam:    VS:  BP 120/64 (BP Location: Right Arm, Patient Position: Sitting)   Pulse 86   Ht 5\' 11"  (1.803 m)   Wt 220 lb 3.2 oz (99.9 kg)   SpO2 96%   BMI 30.71 kg/m     Wt Readings from Last 3 Encounters:  10/03/23 220 lb 3.2 oz (99.9 kg)  04/22/23 276 lb (125.2 kg)  01/08/23 223 lb 3.2 oz (101.2 kg)     GEN:  Well nourished, well developed in no acute distress HEENT: Normal NECK: No JVD; No carotid bruits LYMPHATICS: No lymphadenopathy CARDIAC: RRR, no murmurs, rubs, gallops RESPIRATORY:  Clear to auscultation without rales, wheezing or rhonchi  ABDOMEN: Soft, non-tender, non-distended his abdominal aorta is not palpable MUSCULOSKELETAL:  No edema; No deformity  SKIN: Warm and dry NEUROLOGIC:  Alert and oriented x 3 PSYCHIATRIC:  Normal affect    Signed, Norman Herrlich, MD  10/03/2023 9:56 AM    [facility]

## 2023-10-03 ENCOUNTER — Encounter: Payer: Self-pay | Admitting: Cardiology

## 2023-10-03 ENCOUNTER — Other Ambulatory Visit: Payer: Self-pay | Admitting: Urology

## 2023-10-03 ENCOUNTER — Ambulatory Visit: Payer: Medicare (Managed Care) | Attending: Cardiology | Admitting: Cardiology

## 2023-10-03 VITALS — BP 120/64 | HR 86 | Ht 71.0 in | Wt 220.2 lb

## 2023-10-03 DIAGNOSIS — I714 Abdominal aortic aneurysm, without rupture, unspecified: Secondary | ICD-10-CM | POA: Diagnosis not present

## 2023-10-03 DIAGNOSIS — I10 Essential (primary) hypertension: Secondary | ICD-10-CM | POA: Diagnosis not present

## 2023-10-03 DIAGNOSIS — E782 Mixed hyperlipidemia: Secondary | ICD-10-CM

## 2023-10-03 MED ORDER — ASPIRIN 81 MG PO TBEC: 81.0000 mg | DELAYED_RELEASE_TABLET | Freq: Every day | ORAL | 3 refills | Status: AC

## 2023-10-03 NOTE — Patient Instructions (Signed)
 Medication Instructions:  Your physician has recommended you make the following change in your medication:   START: Aspirin 81 mg daily  *If you need a refill on your cardiac medications before your next appointment, please call your pharmacy*   Lab Work: None If you have labs (blood work) drawn today and your tests are completely normal, you will receive your results only by: MyChart Message (if you have MyChart) OR A paper copy in the mail If you have any lab test that is abnormal or we need to change your treatment, we will call you to review the results.   Testing/Procedures: Your physician has requested that you have an abdominal aorta duplex. During this test, an ultrasound is used to evaluate the aorta. Allow 30 minutes for this exam. Do not eat after midnight the day before and avoid carbonated beverages.  Please note: We ask at that you not bring children with you during ultrasound (echo/ vascular) testing. Due to room size and safety concerns, children are not allowed in the ultrasound rooms during exams. Our front office staff cannot provide observation of children in our lobby area while testing is being conducted. An adult accompanying a patient to their appointment will only be allowed in the ultrasound room at the discretion of the ultrasound technician under special circumstances. We apologize for any inconvenience.    Follow-Up: At [facility], you and your health needs are our priority.  As part of our continuing mission to provide you with exceptional heart care, we have created designated Provider Care Teams.  These Care Teams include your primary Cardiologist (physician) and Advanced Practice Providers (APPs -  Physician Assistants and Nurse Practitioners) who all work together to provide you with the care you need, when you need it.  We recommend signing up for the patient portal called "MyChart".  Sign up information is provided on this After Visit Summary.   MyChart is used to connect with patients for Virtual Visits (Telemedicine).  Patients are able to view lab/test results, encounter notes, upcoming appointments, etc.  Non-urgent messages can be sent to your provider as well.   To learn more about what you can do with MyChart, go to [email or web address].    Your next appointment:   1 year(s)  Provider:   Norman Herrlich, MD    Other Instructions None

## 2023-10-14 NOTE — Patient Instructions (Signed)
 DUE TO COVID-19 ONLY TWO VISITORS  (aged 73 and older)  ARE ALLOWED TO COME WITH YOU AND STAY IN THE WAITING ROOM ONLY DURING PRE OP AND PROCEDURE.   **NO VISITORS ARE ALLOWED IN THE SHORT STAY AREA OR RECOVERY ROOM!!**  IF YOU WILL BE ADMITTED INTO THE HOSPITAL YOU ARE ALLOWED ONLY FOUR SUPPORT PEOPLE DURING VISITATION HOURS ONLY (7 AM -8PM)   The support person(s) must pass our screening, gel in and out, and wear a mask at all times, including in the patient's room. Patients must also wear a mask when staff or their support person are in the room. Visitors GUEST BADGE MUST BE WORN VISIBLY  One adult visitor may remain with you overnight and MUST be in the room by 8 P.M.     Your procedure is scheduled on: 10/16/23   Report to [facility] Main Entrance    Report to admitting at : 10:45 AM   Call this number if you have problems the morning of surgery [phone]   Do not eat food or drink: After Midnight.  FOLLOW BOWEL PREP AND ANY ADDITIONAL PRE OP INSTRUCTIONS YOU RECEIVED FROM YOUR SURGEON'S OFFICE!!!   Oral Hygiene is also important to reduce your risk of infection.                                    Remember - BRUSH YOUR TEETH THE MORNING OF SURGERY WITH YOUR REGULAR TOOTHPASTE  DENTURES WILL BE REMOVED PRIOR TO SURGERY PLEASE DO NOT APPLY "Poly grip" OR ADHESIVES!!!   Do NOT smoke after Midnight   Take these medicines the morning of surgery with A SIP OF WATER: buspirone,finasteride.  DO NOT TAKE ANY ORAL DIABETIC MEDICATIONS (metformin) DAY OF YOUR SURGERY                              You may not have any metal on your body including hair pins, jewelry, and body piercing             Do not wear lotions, powders, perfumes/cologne, or deodorant              Men may shave face and neck.   Do not bring valuables to the hospital. [facility] IS NOT             RESPONSIBLE   FOR VALUABLES.   Contacts, glasses, or bridgework may not be worn into surgery.   Bring  small overnight bag day of surgery.   DO NOT BRING YOUR HOME MEDICATIONS TO THE HOSPITAL. PHARMACY WILL DISPENSE MEDICATIONS LISTED ON YOUR MEDICATION LIST TO YOU DURING YOUR ADMISSION IN THE HOSPITAL!    Patients discharged on the day of surgery will not be allowed to drive home.  Someone NEEDS to stay with you for the first 24 hours after anesthesia.   Special Instructions: Bring a copy of your healthcare power of attorney and living will documents         the day of surgery if you haven't scanned them before.              Please read over the following fact sheets you were given: IF YOU HAVE QUESTIONS ABOUT YOUR PRE-OP INSTRUCTIONS PLEASE CALL [phone]    [facility] - Preparing for Surgery Before surgery, you can play an important role.  Because skin is not  sterile, your skin needs to be as free of germs as possible.  You can reduce the number of germs on your skin by washing with CHG (chlorahexidine gluconate) soap before surgery.  CHG is an antiseptic cleaner which kills germs and bonds with the skin to continue killing germs even after washing. Please DO NOT use if you have an allergy to CHG or antibacterial soaps.  If your skin becomes reddened/irritated stop using the CHG and inform your nurse when you arrive at Short Stay. Do not shave (including legs and underarms) for at least 48 hours prior to the first CHG shower.  You may shave your face/neck. Please follow these instructions carefully:  1.  Shower with CHG Soap the night before surgery and the  morning of Surgery.  2.  If you choose to wash your hair, wash your hair first as usual with your  normal  shampoo.  3.  After you shampoo, rinse your hair and body thoroughly to remove the  shampoo.                           4.  Use CHG as you would any other liquid soap.  You can apply chg directly  to the skin and wash                       Gently with a scrungie or clean washcloth.  5.  Apply the CHG Soap to your body ONLY FROM THE  NECK DOWN.   Do not use on face/ open                           Wound or open sores. Avoid contact with eyes, ears mouth and genitals (private parts).                       Wash face,  Genitals (private parts) with your normal soap.             6.  Wash thoroughly, paying special attention to the area where your surgery  will be performed.  7.  Thoroughly rinse your body with warm water from the neck down.  8.  DO NOT shower/wash with your normal soap after using and rinsing off  the CHG Soap.                9.  Pat yourself dry with a clean towel.            10.  Wear clean pajamas.            11.  Place clean sheets on your bed the night of your first shower and do not  sleep with pets. Day of Surgery : Do not apply any lotions/deodorants the morning of surgery.  Please wear clean clothes to the hospital/surgery center.  FAILURE TO FOLLOW THESE INSTRUCTIONS MAY RESULT IN THE CANCELLATION OF YOUR SURGERY PATIENT SIGNATURE_________________________________  NURSE SIGNATURE__________________________________  ________________________________________________________________________

## 2023-10-15 ENCOUNTER — Encounter (HOSPITAL_COMMUNITY)
Admission: RE | Admit: 2023-10-15 | Discharge: 2023-10-15 | Disposition: A | Discharge status: Home / Self Care | Source: Ambulatory Visit | Attending: Urology | Admitting: Urology

## 2023-10-15 ENCOUNTER — Other Ambulatory Visit: Payer: Self-pay

## 2023-10-15 ENCOUNTER — Encounter (HOSPITAL_COMMUNITY): Payer: Self-pay

## 2023-10-15 VITALS — BP 128/79 | HR 75 | Temp 97.6°F | Ht 71.0 in | Wt 221.0 lb

## 2023-10-15 DIAGNOSIS — I10 Essential (primary) hypertension: Secondary | ICD-10-CM | POA: Diagnosis not present

## 2023-10-15 DIAGNOSIS — E1165 Type 2 diabetes mellitus with hyperglycemia: Secondary | ICD-10-CM | POA: Insufficient documentation

## 2023-10-15 DIAGNOSIS — R338 Other retention of urine: Secondary | ICD-10-CM | POA: Insufficient documentation

## 2023-10-15 DIAGNOSIS — Z01818 Encounter for other preprocedural examination: Secondary | ICD-10-CM | POA: Insufficient documentation

## 2023-10-15 DIAGNOSIS — N401 Enlarged prostate with lower urinary tract symptoms: Secondary | ICD-10-CM | POA: Diagnosis not present

## 2023-10-15 HISTORY — DX: Pneumonia, unspecified organism: J18.9

## 2023-10-15 LAB — HEMOGLOBIN A1C
Hgb A1c MFr Bld: 9.2 % — ABNORMAL HIGH (ref 4.8–5.6)
Mean Plasma Glucose: 217.34 mg/dL

## 2023-10-15 LAB — BASIC METABOLIC PANEL
Anion gap: 8 (ref 5–15)
BUN: 16 mg/dL (ref 8–23)
CO2: 26 mmol/L (ref 22–32)
Calcium: 9.1 mg/dL (ref 8.9–10.3)
Chloride: 103 mmol/L (ref 98–111)
Creatinine, Ser: 0.81 mg/dL (ref 0.61–1.24)
GFR, Estimated: >60 mL/min (ref >60)
Glucose, Bld: 187 mg/dL — ABNORMAL HIGH (ref 70–99)
Potassium: 4.6 mmol/L (ref 3.5–5.1)
Sodium: 137 mmol/L (ref 135–145)

## 2023-10-15 LAB — CBC
HCT: 38.3 % — ABNORMAL LOW (ref 39.0–52.0)
Hemoglobin: 13.4 g/dL (ref 13.0–17.0)
MCH: 32.7 pg (ref 26.0–34.0)
MCHC: 35 g/dL (ref 30.0–36.0)
MCV: 93.4 fL (ref 80.0–100.0)
Platelets: 215 10*3/uL (ref 150–400)
RBC: 4.1 MIL/uL — ABNORMAL LOW (ref 4.22–5.81)
RDW: 12.2 % (ref 11.5–15.5)
WBC: 7.3 10*3/uL (ref 4.0–10.5)
nRBC: 0 % (ref 0.0–0.2)

## 2023-10-15 LAB — GLUCOSE, CAPILLARY: Glucose-Capillary: 164 mg/dL — ABNORMAL HIGH (ref 70–99)

## 2023-10-15 NOTE — Progress Notes (Addendum)
 Anesthesia Chart Review   Case: [id number] Date/Time: 10/16/23 1245   Procedure: TURP (TRANSURETHRAL RESECTION OF PROSTATE)   Anesthesia type: General   Diagnosis: Enlarged prostate with urinary retention [N40.1, R33.8]   Pre-op diagnosis: ENLARGED PROSTATE WITH RETENTION   Location: [facility] ROOM 10 / [facility]   Surgeons: Loletta Parish., MD       DISCUSSION:73 y.o. 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 Sebastian Ache.   Pt follows with cardiology for surveillance of AAA. Last duplex with AAA 3.7 cm.  Next duplex scheduled for 10/22/23. HTN well controlled.   A1C 9.2, forwarded to surgeon and PCP.  Evaluate CBG DOS.  VS: BP 128/79   Pulse 75   Temp 36.4 C (Oral)   Ht 5\' 11"  (1.803 m)   Wt 100.2 kg   SpO2 98%   BMI 30.82 kg/m   PROVIDERS: Bradd Canary, MD is PCP   Norman Herrlich, MD is Cardiologist  LABS: Labs reviewed: Acceptable for surgery. (all labs ordered are listed, but only abnormal results are displayed)  Labs Reviewed  HEMOGLOBIN A1C - Abnormal; Notable for the following components:      Result Value   Hgb A1c MFr Bld 9.2 (*)    All other components within normal limits  BASIC METABOLIC PANEL - Abnormal; Notable for the following components:   Glucose, Bld 187 (*)    All other components within normal limits  CBC - Abnormal; Notable for the following components:   RBC 4.10 (*)    HCT 38.3 (*)    All other components within normal limits  GLUCOSE, CAPILLARY - Abnormal; Notable for the following components:   Glucose-Capillary 164 (*)    All other components within normal limits     IMAGES: VAS [place] AAA Duplex 04/24/2022 Summary:  Abdominal Aorta: There is evidence of abnormal dilatation of the mid  Abdominal aorta. The largest aortic measurement is 3.7 cm. The largest  aortic diameter remains essentially unchanged compared to prior exam.  Previous diameter measurement was  3.7 cm  obtained on 07/31/2021.  IVC/Iliac: There is no evidence of thrombus involving the IVC.   EKG:   CV: Echo 04/27/2014 - Left ventricle: The cavity size was normal. Systolic function was    vigorous. The estimated ejection fraction was in the range of 65%    to 70%. Wall motion was normal; there were no regional wall    motion abnormalities. Doppler parameters are consistent with    abnormal left ventricular relaxation (grade 1 diastolic    dysfunction).  - Aortic valve: Structurally normal valve. Trileaflet. There was    trivial regurgitation.  - Mitral valve: There was no significant regurgitation.  - Left atrium: The atrium was mildly dilated.  - Right ventricle: Systolic function was normal.  - Right atrium: The atrium was normal in size.  - Tricuspid valve: There was mild regurgitation.  - Pulmonic valve: There was no regurgitation.  - Pulmonary arteries: The main pulmonary artery was normal-sized.  - Pericardium, extracardiac: The pericardium was normal in    appearance.   Past Medical History:  Diagnosis Date   AAA (abdominal aortic aneurysm) (HCC) 10/06/2018   Acid reflux 06/27/2021   Anxiety 09/24/2011   Arthritis 10/02/2023   Atopic dermatitis 11/26/2016   Benign neoplasm of sigmoid colon 10/29/2014   BPH (benign prostatic hyperplasia) 08/26/2019   Constipation 05/25/2019   Costochondritis 04/03/2018   Depression  Diabetes mellitus type 2 in obese 05/25/2012   Elevated PSA 04/06/2021   Enlarged prostate    Erectile dysfunction 06/13/2010   Formatting of this note might be different from the original. Cialis 20 mg daily prn    Last Assessment & Plan:  Formatting of this note might be different from the original. Patient has good results with Cialis 20 mg as needed, but has run out. In the past, he tried Viagra with inadequate results. He was given a refill for #9 with 11 refills.   Fatty liver 10/24/2021   Gallstone 10/06/2018   Headache 04/10/2010    Formatting of this note might be different from the original. Sumatriptan 100 mg prn and hydrocodone/APAP 7.5/500 mg prn  ICD-10 cut over   Last Assessment & Plan:  Formatting of this note might be different from the original. Patient is having a migraine today and was given a sample of relpax 40 mg today because he isn't carrying the sumatriptan with him. 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 Crestor 20 mg daily   Last Assessment & Plan:  Formatting of this note might be different from the original. Patient is taking Crestor 20 mg but has some compliance issues, missing it 2-3 times weekly. He says that he misses it because he can't find it and I have suggested that he use a med-minder. His lipids are at goal and I expect hi   Hyperlipidemia 05/25/2012   Hypertension 09/24/2011   Has been off of Lisinopril for the past week. Would restart Lisinopril 5 mg po daily     Hypogonadism male 09/24/2011   Insomnia 09/27/2020   Left knee DJD    Lumbago 04/25/2014   Migraine headache 08/03/2012   Muscle weakness 05/14/2023   Need for prophylactic vaccination and inoculation against influenza 05/13/2013   Obesity 04/11/2007   Last Assessment & Plan:  Formatting of this note might be different from the original. Patient has gained about 22 pounds since he quit smoking 6 months ago. He is craving food more and says that he can't "fight two devils" at once. I have made a few suggestions about how he could reduce it.   Other diseases of stomach and duodenum 07/03/2021   Other malaise and fatigue 11/01/2012   Pneumonia    Polyp of colon 02/10/2020   Prediabetes 11/26/2016   Preventative health care 09/22/2014   Prostatic hypertrophy 04/10/2010    Formatting of this note might be different from the original. Finasteride 5 mg daily  Last Assessment & Plan:  Formatting of this note might be different from the original. Patient isn't having significant symptoms on the finasteride 5 mg daily.   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 Patient is having no pulmonary symptoms since he has quit smoking.   Reduced libido 08/20/2011   Restless sleeper 09/28/2020   Right knee pain 06/27/2021   Right-sided chest pain 10/02/2023   RUQ pain 10/02/2023   Salivary gland stone 02/28/2016   Seborrheic keratoses 04/03/2018   Sun-damaged skin 09/28/2020   Tiredness 04/25/2014   Tobacco use disorder 10/30/2012   Type 2 diabetes mellitus (  HCC) 04/10/2010   Last Assessment & Plan:  Formatting of this note might be different from the original. Patient has had IFG for years and hasn't been able to take my suggestions regarding weight loss and healthy eating. Blood work done last month shows a glucose that is now 129 and A1c done today, POCT was 6.6%. I have told the patient that given his 22 pound weight gain since smoking cessation, this isn't surpris   Unspecified essential hypertension 05/25/2012   Does not see a cardiologist    Past Surgical History:  Procedure Laterality Date   FINGER FRACTURE SURGERY Left    left hand surgery     PARTIAL KNEE ARTHROPLASTY Left 04/06/2013   Procedure: UNICOMPARTMENTAL LEFT KNEE;  Surgeon: Nilda Simmer, MD;  Location: [facility] OR;  Service: Orthopedics;  Laterality: Left;   UMBILICAL HERNIA REPAIR  07/24/2015   w/ small ventral ex mesh    MEDICATIONS:  acetaminophen (TYLENOL) 500 MG tablet   aspirin EC 81 MG tablet   busPIRone (BUSPAR) 5 MG tablet   finasteride (PROSCAR) 5 MG tablet   metFORMIN (GLUCOPHAGE) 500 MG tablet   rosuvastatin (CRESTOR) 20 MG tablet   tamsulosin (FLOMAX) 0.4 MG CAPS capsule    telmisartan-hydrochlorothiazide (MICARDIS HCT) 40-12.5 MG tablet   terbinafine (LAMISIL) 250 MG tablet   triamcinolone (KENALOG) 0.1 %   No current facility-administered medications for this encounter.    Jodell Cipro Ward, PA-C [facility] [phone]

## 2023-10-15 NOTE — Progress Notes (Signed)
 For Anesthesia: PCP - Bradd Canary, MD  Cardiologist - Dr. Norman Herrlich.LOV: 10/03/23  Bowel Prep reminder:  Chest x-ray - CT Chest: 01/07/23 EKG - 04/22/23 Stress Test -  ECHO - 04/27/14 Cardiac Cath -  Pacemaker/ICD device last checked: Pacemaker orders received: Device Rep notified:  Spinal Cord Stimulator:N/A  Sleep Study - Yes CPAP - NO  Fasting Blood Sugar - N/A Checks Blood Sugar __0___ times a day Date and result of last Hgb A1c-7.4: 04/10/23  Last dose of GLP1 agonist- N/A GLP1 instructions:   Last dose of SGLT-2 inhibitors- N/A SGLT-2 instructions:   Blood Thinner Instructions: N/A Aspirin Instructions: Last Dose:  Activity level: Can go up a flight of stairs and activities of daily living without stopping and without chest pain and/or shortness of breath   Able to exercise without chest pain and/or shortness of breath  Anesthesia review: Hx: AAA,Smoker.  Patient denies shortness of breath, fever, cough and chest pain at PAT appointment   Patient verbalized understanding of instructions that were given to them at the PAT appointment. Patient was also instructed that they will need to review over the PAT instructions again at home before surgery.

## 2023-10-15 NOTE — Progress Notes (Signed)
 Lab. Results: A1C: 9.2

## 2023-10-16 ENCOUNTER — Encounter (HOSPITAL_COMMUNITY): Payer: Self-pay | Admitting: Urology

## 2023-10-16 ENCOUNTER — Ambulatory Visit (HOSPITAL_COMMUNITY): Payer: Self-pay | Admitting: Registered Nurse

## 2023-10-16 ENCOUNTER — Ambulatory Visit (HOSPITAL_BASED_OUTPATIENT_CLINIC_OR_DEPARTMENT_OTHER): Payer: Self-pay | Admitting: Registered Nurse

## 2023-10-16 ENCOUNTER — Encounter (HOSPITAL_COMMUNITY)
Admission: RE | Disposition: A | Discharge status: Home / Self Care | Payer: Self-pay | Source: Home / Self Care | Attending: Urology

## 2023-10-16 ENCOUNTER — Observation Stay (HOSPITAL_COMMUNITY)
Admission: RE | Admit: 2023-10-16 | Discharge: 2023-10-17 | Disposition: A | Discharge status: Home / Self Care | Attending: Urology | Admitting: Urology

## 2023-10-16 ENCOUNTER — Other Ambulatory Visit: Payer: Self-pay

## 2023-10-16 DIAGNOSIS — N401 Enlarged prostate with lower urinary tract symptoms: Principal | ICD-10-CM | POA: Insufficient documentation

## 2023-10-16 DIAGNOSIS — R339 Retention of urine, unspecified: Secondary | ICD-10-CM | POA: Diagnosis not present

## 2023-10-16 DIAGNOSIS — R3914 Feeling of incomplete bladder emptying: Secondary | ICD-10-CM | POA: Insufficient documentation

## 2023-10-16 DIAGNOSIS — E119 Type 2 diabetes mellitus without complications: Secondary | ICD-10-CM | POA: Insufficient documentation

## 2023-10-16 DIAGNOSIS — N400 Enlarged prostate without lower urinary tract symptoms: Principal | ICD-10-CM | POA: Diagnosis present

## 2023-10-16 DIAGNOSIS — F17210 Nicotine dependence, cigarettes, uncomplicated: Secondary | ICD-10-CM | POA: Insufficient documentation

## 2023-10-16 DIAGNOSIS — R972 Elevated prostate specific antigen [PSA]: Secondary | ICD-10-CM | POA: Insufficient documentation

## 2023-10-16 DIAGNOSIS — Z96652 Presence of left artificial knee joint: Secondary | ICD-10-CM | POA: Diagnosis not present

## 2023-10-16 DIAGNOSIS — I10 Essential (primary) hypertension: Secondary | ICD-10-CM | POA: Diagnosis not present

## 2023-10-16 DIAGNOSIS — R338 Other retention of urine: Secondary | ICD-10-CM | POA: Diagnosis not present

## 2023-10-16 DIAGNOSIS — R3915 Urgency of urination: Secondary | ICD-10-CM | POA: Diagnosis not present

## 2023-10-16 HISTORY — PX: TRANSURETHRAL RESECTION OF PROSTATE: SHX73

## 2023-10-16 LAB — GLUCOSE, CAPILLARY
Glucose-Capillary: 148 mg/dL — ABNORMAL HIGH (ref 70–99)
Glucose-Capillary: 140 mg/dL — ABNORMAL HIGH (ref 70–99)
Glucose-Capillary: 139 mg/dL — ABNORMAL HIGH (ref 70–99)
Glucose-Capillary: 177 mg/dL — ABNORMAL HIGH (ref 70–99)

## 2023-10-16 LAB — HEMOGLOBIN AND HEMATOCRIT, BLOOD
HCT: 33.4 % — ABNORMAL LOW (ref 39.0–52.0)
Hemoglobin: 11.7 g/dL — ABNORMAL LOW (ref 13.0–17.0)

## 2023-10-16 SURGERY — TURP (TRANSURETHRAL RESECTION OF PROSTATE)
Anesthesia: General

## 2023-10-16 MED ORDER — TERBINAFINE HCL 250 MG PO TABS
250.0000 mg | ORAL_TABLET | Freq: Every day | ORAL | Status: DC
  Administered 2023-10-16 – 2023-10-17 (×2): 250 mg via ORAL
  Filled 2023-10-16 (×2): qty 1

## 2023-10-16 MED ORDER — TELMISARTAN-HCTZ 40-12.5 MG PO TABS: 1.0000 | ORAL_TABLET | Freq: Every day | ORAL | Status: DC

## 2023-10-16 MED ORDER — OXYCODONE HCL 5 MG/5ML PO SOLN: 5.0000 mg | Freq: Once | ORAL | Status: AC | PRN

## 2023-10-16 MED ORDER — FENTANYL CITRATE (PF) 100 MCG/2ML IJ SOLN
INTRAMUSCULAR | Status: AC
  Filled 2023-10-16: qty 2

## 2023-10-16 MED ORDER — SENNOSIDES-DOCUSATE SODIUM 8.6-50 MG PO TABS
1.0000 | ORAL_TABLET | Freq: Two times a day (BID) | ORAL | Status: DC
  Administered 2023-10-16 – 2023-10-17 (×2): 1 via ORAL
  Filled 2023-10-16 (×2): qty 1

## 2023-10-16 MED ORDER — INSULIN ASPART 100 UNIT/ML IJ SOLN
0.0000 [IU] | Freq: Three times a day (TID) | INTRAMUSCULAR | Status: DC
  Administered 2023-10-17: 3 [IU] via SUBCUTANEOUS
  Administered 2023-10-17: 5 [IU] via SUBCUTANEOUS

## 2023-10-16 MED ORDER — SODIUM CHLORIDE 0.9 % IV SOLN: INTRAVENOUS | Status: DC

## 2023-10-16 MED ORDER — IRBESARTAN 150 MG PO TABS
150.0000 mg | ORAL_TABLET | Freq: Every day | ORAL | Status: DC
  Administered 2023-10-16 – 2023-10-17 (×2): 150 mg via ORAL
  Filled 2023-10-16 (×2): qty 1

## 2023-10-16 MED ORDER — FENTANYL CITRATE PF 50 MCG/ML IJ SOSY
PREFILLED_SYRINGE | INTRAMUSCULAR | Status: AC
Start: 2023-10-16 — End: 2023-10-17
  Filled 2023-10-16: qty 1

## 2023-10-16 MED ORDER — FINASTERIDE 5 MG PO TABS
5.0000 mg | ORAL_TABLET | Freq: Every day | ORAL | Status: DC
  Administered 2023-10-17: 5 mg via ORAL
  Filled 2023-10-16: qty 1

## 2023-10-16 MED ORDER — ONDANSETRON HCL 4 MG/2ML IJ SOLN
INTRAMUSCULAR | Status: DC | PRN
  Administered 2023-10-16: 4 mg via INTRAVENOUS

## 2023-10-16 MED ORDER — INSULIN ASPART 100 UNIT/ML IJ SOLN: 0.0000 [IU] | Freq: Every day | INTRAMUSCULAR | Status: DC

## 2023-10-16 MED ORDER — BUSPIRONE HCL 5 MG PO TABS
5.0000 mg | ORAL_TABLET | Freq: Two times a day (BID) | ORAL | Status: DC
  Administered 2023-10-16 – 2023-10-17 (×2): 5 mg via ORAL
  Filled 2023-10-16 (×2): qty 1

## 2023-10-16 MED ORDER — ORAL CARE MOUTH RINSE: 15.0000 mL | Freq: Once | OROMUCOSAL | Status: AC

## 2023-10-16 MED ORDER — INSULIN ASPART 100 UNIT/ML IJ SOLN
3.0000 [IU] | Freq: Three times a day (TID) | INTRAMUSCULAR | Status: DC
  Administered 2023-10-16 – 2023-10-17 (×3): 3 [IU] via SUBCUTANEOUS

## 2023-10-16 MED ORDER — ROSUVASTATIN CALCIUM 20 MG PO TABS
20.0000 mg | ORAL_TABLET | Freq: Every day | ORAL | Status: DC
  Administered 2023-10-16 – 2023-10-17 (×2): 20 mg via ORAL
  Filled 2023-10-16 (×2): qty 1

## 2023-10-16 MED ORDER — DEXTROSE 5 % IV SOLN
5.0000 mg/kg | INTRAVENOUS | Status: AC
  Administered 2023-10-16: 430 mg via INTRAVENOUS
  Filled 2023-10-16: qty 10.75

## 2023-10-16 MED ORDER — OXYCODONE HCL 5 MG PO TABS
5.0000 mg | ORAL_TABLET | Freq: Once | ORAL | Status: AC | PRN
  Administered 2023-10-16: 5 mg via ORAL

## 2023-10-16 MED ORDER — 0.9 % SODIUM CHLORIDE (POUR BTL) OPTIME
TOPICAL | Status: DC | PRN
  Administered 2023-10-16: 1000 mL

## 2023-10-16 MED ORDER — SODIUM CHLORIDE 0.9 % IR SOLN
Status: DC | PRN
Start: 2023-10-16 — End: 2023-10-16
  Administered 2023-10-16: 18000 mL

## 2023-10-16 MED ORDER — PROPOFOL 10 MG/ML IV BOLUS
INTRAVENOUS | Status: DC | PRN
Start: 2023-10-16 — End: 2023-10-16
  Administered 2023-10-16: 50 mg via INTRAVENOUS
  Administered 2023-10-16: 150 mg via INTRAVENOUS

## 2023-10-16 MED ORDER — FENTANYL CITRATE PF 50 MCG/ML IJ SOSY
25.0000 ug | PREFILLED_SYRINGE | INTRAMUSCULAR | Status: DC | PRN
  Administered 2023-10-16 (×2): 50 ug via INTRAVENOUS

## 2023-10-16 MED ORDER — CHLORHEXIDINE GLUCONATE 0.12 % MT SOLN
15.0000 mL | Freq: Once | OROMUCOSAL | Status: AC
  Administered 2023-10-16: 15 mL via OROMUCOSAL

## 2023-10-16 MED ORDER — OXYCODONE HCL 5 MG PO TABS
5.0000 mg | ORAL_TABLET | ORAL | Status: DC | PRN
  Administered 2023-10-16: 5 mg via ORAL
  Filled 2023-10-16: qty 1

## 2023-10-16 MED ORDER — LIDOCAINE HCL (CARDIAC) PF 100 MG/5ML IV SOSY
PREFILLED_SYRINGE | INTRAVENOUS | Status: DC | PRN
  Administered 2023-10-16: 100 mg via INTRAVENOUS

## 2023-10-16 MED ORDER — PHENYLEPHRINE 80 MCG/ML (10ML) SYRINGE FOR IV PUSH (FOR BLOOD PRESSURE SUPPORT)
PREFILLED_SYRINGE | INTRAVENOUS | Status: DC | PRN
  Administered 2023-10-16: 80 ug via INTRAVENOUS

## 2023-10-16 MED ORDER — LIDOCAINE HCL (PF) 2 % IJ SOLN
INTRAMUSCULAR | Status: AC
  Filled 2023-10-16: qty 5

## 2023-10-16 MED ORDER — LACTATED RINGERS IV SOLN: INTRAVENOUS | Status: DC

## 2023-10-16 MED ORDER — HYDROMORPHONE HCL 1 MG/ML IJ SOLN
0.5000 mg | INTRAMUSCULAR | Status: DC | PRN
  Administered 2023-10-17: 1 mg via INTRAVENOUS
  Filled 2023-10-16 (×2): qty 1

## 2023-10-16 MED ORDER — ACETAMINOPHEN 500 MG PO TABS
1000.0000 mg | ORAL_TABLET | Freq: Once | ORAL | Status: AC
  Administered 2023-10-16: 1000 mg via ORAL
  Filled 2023-10-16: qty 2

## 2023-10-16 MED ORDER — INSULIN ASPART 100 UNIT/ML IJ SOLN: 0.0000 [IU] | INTRAMUSCULAR | Status: DC | PRN

## 2023-10-16 MED ORDER — FENTANYL CITRATE (PF) 100 MCG/2ML IJ SOLN
INTRAMUSCULAR | Status: DC | PRN
  Administered 2023-10-16 (×4): 50 ug via INTRAVENOUS

## 2023-10-16 MED ORDER — HYDROCHLOROTHIAZIDE 12.5 MG PO TABS
12.5000 mg | ORAL_TABLET | Freq: Every day | ORAL | Status: DC
  Administered 2023-10-16 – 2023-10-17 (×2): 12.5 mg via ORAL
  Filled 2023-10-16 (×2): qty 1

## 2023-10-16 MED ORDER — ACETAMINOPHEN 500 MG PO TABS
1000.0000 mg | ORAL_TABLET | Freq: Four times a day (QID) | ORAL | Status: AC
  Administered 2023-10-16 – 2023-10-17 (×4): 1000 mg via ORAL
  Filled 2023-10-16 (×4): qty 2

## 2023-10-16 MED ORDER — DROPERIDOL 2.5 MG/ML IJ SOLN: 0.6250 mg | Freq: Once | INTRAMUSCULAR | Status: DC | PRN

## 2023-10-16 MED ORDER — OXYCODONE HCL 5 MG PO TABS
ORAL_TABLET | ORAL | Status: AC
  Filled 2023-10-16: qty 1

## 2023-10-16 MED ORDER — ONDANSETRON HCL 4 MG/2ML IJ SOLN
INTRAMUSCULAR | Status: AC
  Filled 2023-10-16: qty 2

## 2023-10-16 MED ORDER — SODIUM CHLORIDE 0.9 % IR SOLN
3000.0000 mL | Status: DC
  Administered 2023-10-16: 3000 mL

## 2023-10-16 MED ORDER — FENTANYL CITRATE PF 50 MCG/ML IJ SOSY
PREFILLED_SYRINGE | INTRAMUSCULAR | Status: AC
  Filled 2023-10-16: qty 1

## 2023-10-16 MED ORDER — DEXMEDETOMIDINE HCL IN NACL 80 MCG/20ML IV SOLN
INTRAVENOUS | Status: DC | PRN
  Administered 2023-10-16 (×2): 8 ug via INTRAVENOUS

## 2023-10-16 SURGICAL SUPPLY — 21 items
BAG URINE DRAIN 2000ML AR STRL (UROLOGICAL SUPPLIES) ×1 IMPLANT
BAG URO CATCHER STRL LF (MISCELLANEOUS) ×1 IMPLANT
CATH URTH STD 24FR FL 3W 2 (CATHETERS) IMPLANT
DRAPE FOOT SWITCH (DRAPES) ×1 IMPLANT
ELECT REM PT RETURN 15FT ADLT (MISCELLANEOUS) IMPLANT
GLOVE SURG LX STRL 7.5 STRW (GLOVE) ×1 IMPLANT
GOWN STRL REUS W/ TWL XL LVL3 (GOWN DISPOSABLE) ×1 IMPLANT
GUIDEWIRE STR DUAL SENSOR (WIRE) IMPLANT
HOLDER FOLEY CATH W/STRAP (MISCELLANEOUS) IMPLANT
IV CATH AUTO 14GX1.75 SAFE ORG (IV SOLUTION) IMPLANT
KIT TURNOVER KIT A (KITS) IMPLANT
LOOP CUT BIPOLAR 24F LRG (ELECTROSURGICAL) IMPLANT
MANIFOLD NEPTUNE II (INSTRUMENTS) ×1 IMPLANT
PACK CYSTO (CUSTOM PROCEDURE TRAY) ×1 IMPLANT
PAD PREP 24X48 CUFFED NSTRL (MISCELLANEOUS) ×1 IMPLANT
SYR 30ML LL (SYRINGE) ×1 IMPLANT
SYR TOOMEY IRRIG 70ML (MISCELLANEOUS) ×1 IMPLANT
SYRINGE TOOMEY IRRIG 70ML (MISCELLANEOUS) ×1 IMPLANT
TUBING CONNECTING 10 (TUBING) ×1 IMPLANT
TUBING UROLOGY SET (TUBING) ×1 IMPLANT
WATER STERILE IRR 500ML POUR (IV SOLUTION) IMPLANT

## 2023-10-16 NOTE — Brief Op Note (Signed)
 10/16/2023  1:44 PM  PATIENT:  Nathan Hess  73 y.o. male  PRE-OPERATIVE DIAGNOSIS:  ENLARGED PROSTATE WITH RETENTION  POST-OPERATIVE DIAGNOSIS:  ENLARGED PROSTATE WITH RETENTION  PROCEDURE:  Procedure(s): TURP (TRANSURETHRAL RESECTION OF PROSTATE) (N/A)  SURGEON:  Surgeons and Role:    * Manny, Delbert Phenix., MD - Primary  PHYSICIAN ASSISTANT:   ASSISTANTS: none   ANESTHESIA:   general  EBL:  5 mL   BLOOD ADMINISTERED:none  DRAINS:  25F 3 way foley to NS irrigation    LOCAL MEDICATIONS USED:  NONE  SPECIMEN:  Source of Specimen:  prostate chips  DISPOSITION OF SPECIMEN:  PATHOLOGY  COUNTS:  YES  TOURNIQUET:  * No tourniquets in log *  DICTATION: .Other Dictation: Dictation Number [id number]  PLAN OF CARE: Admit for overnight observation  PATIENT DISPOSITION:  PACU - hemodynamically stable.   Delay start of Pharmacological VTE agent (>24hrs) due to surgical blood loss or risk of bleeding: not applicable

## 2023-10-16 NOTE — Anesthesia Preprocedure Evaluation (Addendum)
 Anesthesia Evaluation  Patient identified by MRN, date of birth, ID band Patient awake    Reviewed: Allergy & Precautions, NPO status , Patient's Chart, lab work & pertinent test results  History of Anesthesia Complications Negative for: history of anesthetic complications  Airway Mallampati: II  TM Distance: >3 FB Neck ROM: Full    Dental no notable dental hx.    Pulmonary Current Smoker   Pulmonary exam normal        Cardiovascular hypertension, Pt. on medications Normal cardiovascular exam     Neuro/Psych  Headaches  Anxiety Depression       GI/Hepatic Neg liver ROS,GERD  ,,  Endo/Other  diabetes, Type 2, Oral Hypoglycemic Agents    Renal/GU negative Renal ROS     Musculoskeletal  (+) Arthritis ,    Abdominal   Peds  Hematology negative hematology ROS (+)   Anesthesia Other Findings ENLARGED PROSTATE WITH RETENTION  Reproductive/Obstetrics                             Anesthesia Physical Anesthesia Plan  ASA: 2  Anesthesia Plan: General   Post-op Pain Management: Tylenol PO (pre-op)*   Induction: Intravenous  PONV Risk Score and Plan: 1 and Ondansetron, Dexamethasone and Treatment may vary due to age or medical condition  Airway Management Planned: LMA  Additional Equipment: None  Intra-op Plan:   Post-operative Plan: Extubation in OR  Informed Consent: I have reviewed the patients History and Physical, chart, labs and discussed the procedure including the risks, benefits and alternatives for the proposed anesthesia with the patient or authorized representative who has indicated his/her understanding and acceptance.     Dental advisory given  Plan Discussed with: CRNA  Anesthesia Plan Comments:         Anesthesia Quick Evaluation

## 2023-10-16 NOTE — Plan of Care (Signed)

## 2023-10-16 NOTE — Anesthesia Postprocedure Evaluation (Signed)
 Anesthesia Post Note  Patient: Nathan Hess  Procedure(s) Performed: TURP (TRANSURETHRAL RESECTION OF PROSTATE)     Patient location during evaluation: PACU Anesthesia Type: General Level of consciousness: awake and alert Pain management: pain level controlled Vital Signs Assessment: post-procedure vital signs reviewed and stable Respiratory status: spontaneous breathing, nonlabored ventilation and respiratory function stable Cardiovascular status: blood pressure returned to baseline Postop Assessment: no apparent nausea or vomiting Anesthetic complications: no   There were no known notable events for this encounter.  Last Vitals:  Vitals:   10/16/23 1430 10/16/23 1445  BP: 98/62 (!) 118/98  Pulse: 71 76  Resp: (!) 8 19  Temp:    SpO2: 95% 96%    Last Pain:  Vitals:   10/16/23 1449  TempSrc:   PainSc: 3                  Shanda Howells

## 2023-10-16 NOTE — H&P (Signed)
 Nathan Hess is an 73 y.o. male.    Chief Complaint: Pre-Op Transurethral Resection of Prostate  HPI:   1 - Elevated PSA -  2020 - PSA 3.78  08/2020 - PSA 4.6 (PCP labs) at age 39. DRE 70gm with mild mid induration, no frank nodules; 01/2021 PSA 3.45  07/2021 - PSA 2.16 (finasteride), DRE stable  07/2023 - PSA 1.42 (finaasteride)   2 - Lower Urinary Tract Symptoms / Urgency / Incomplete Emptying - decades of modest bother form mix of obstructive and irritative symptoms. ON tamsulosin at baseline. UA with glycosuria. PVR 10/2020 " " (elevated). DRE 10/2020 70gm (enlarged). Adding finasteride 01/2021. TRUS 2025 90gm with large medain, Urocuff PDet 140 at Qm 4 (very obstructed). Cysto 09/2023 confirms no strictures, just trilobar prostatic hypertrophy.   Recent Course:  06/2021 - PVR 470 mL (improved) and dramatic symptom improvement.  07/2021 - PVR 564 mL   PMH sig for DM2 (A1c <7), ubmilical hernia repair, obesity. NO CV disease / blood thinners. He is retired from [employer] center in [place], lives in [place]. His PCP is [name] MD.   Today "Nathan Hess" is seen to proceed with TURP. No interval fevers. Most recent UCX enterococcus treated with bactrim. A1c 9s (non-ideal but he is comliant). Cr 0.8, Hgb 13.   Past Medical History:  Diagnosis Date   AAA (abdominal aortic aneurysm) (HCC) 10/06/2018   Acid reflux 06/27/2021   Anxiety 09/24/2011   Arthritis 10/02/2023   Atopic dermatitis 11/26/2016   Benign neoplasm of sigmoid colon 10/29/2014   BPH (benign prostatic hyperplasia) 08/26/2019   Constipation 05/25/2019   Costochondritis 04/03/2018   Depression    Diabetes mellitus type 2 in obese 05/25/2012   Elevated PSA 04/06/2021   Enlarged prostate    Erectile dysfunction 06/13/2010   Formatting of this note might be different from the original. Cialis 20 mg daily prn    Last Assessment & Plan:  Formatting of this note might be different from the original. Patient has good results  with Cialis 20 mg as needed, but has run out. In the past, he tried Viagra with inadequate results. He was given a refill for #9 with 11 refills.   Fatty liver 10/24/2021   Gallstone 10/06/2018   Headache 04/10/2010   Formatting of this note might be different from the original. Sumatriptan 100 mg prn and hydrocodone/APAP 7.5/500 mg prn  ICD-10 cut over   Last Assessment & Plan:  Formatting of this note might be different from the original. Patient is having a migraine today and was given a sample of relpax 40 mg today because he isn't carrying the sumatriptan with him. He says that he is getting very few migra   Hearing loss in left ear 01/08/2017   Heartburn 10/02/2023   History of colonic polyps 06/26/2015   3rd colonoscopy in 2016 with Dr Grayling Congress in [place]   Hypercholesterolemia 04/11/2007   Formatting of this note might be different from the original. Crestor 20 mg daily   Last Assessment & Plan:  Formatting of this note might be different from the original. Patient is taking Crestor 20 mg but has some compliance issues, missing it 2-3 times weekly. He says that he misses it because he can't find it and I have suggested that he use a med-minder. His lipids are at goal and I expect hi   Hyperlipidemia 05/25/2012   Hypertension 09/24/2011   Has been off of Lisinopril for the past week. Would restart Lisinopril 5 mg  po daily     Hypogonadism male 09/24/2011   Insomnia 09/27/2020   Left knee DJD    Lumbago 04/25/2014   Migraine headache 08/03/2012   Muscle weakness 05/14/2023   Need for prophylactic vaccination and inoculation against influenza 05/13/2013   Obesity 04/11/2007   Last Assessment & Plan:  Formatting of this note might be different from the original. Patient has gained about 22 pounds since he quit smoking 6 months ago. He is craving food more and says that he can't "fight two devils" at once. I have made a few suggestions about how he could reduce it.   Other diseases of  stomach and duodenum 07/03/2021   Other malaise and fatigue 11/01/2012   Pneumonia    Polyp of colon 02/10/2020   Prediabetes 11/26/2016   Preventative health care 09/22/2014   Prostatic hypertrophy 04/10/2010   Formatting of this note might be different from the original. Finasteride 5 mg daily  Last Assessment & Plan:  Formatting of this note might be different from the original. Patient isn't having significant symptoms on the finasteride 5 mg daily.   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 Patient is having no pulmonary symptoms since he has quit smoking.   Reduced libido 08/20/2011   Restless sleeper 09/28/2020   Right knee pain 06/27/2021   Right-sided chest pain 10/02/2023   RUQ pain 10/02/2023   Salivary gland stone 02/28/2016   Seborrheic keratoses 04/03/2018   Sun-damaged skin 09/28/2020   Tiredness 04/25/2014   Tobacco use disorder 10/30/2012   Type 2 diabetes mellitus (HCC) 04/10/2010   Last Assessment & Plan:  Formatting of this note might be different from the original. Patient has had IFG for years and hasn't been able to take my suggestions regarding weight loss and healthy eating. Blood work done last month shows a glucose that is now 129 and A1c done today, POCT was 6.6%. I have told the patient that given his 22 pound weight gain since smoking cessation, this isn't surpris   Unspecified essential hypertension 05/25/2012   Does not see a cardiologist    Past Surgical History:  Procedure Laterality Date   FINGER FRACTURE SURGERY Left    left hand surgery     PARTIAL KNEE ARTHROPLASTY Left 04/06/2013   Procedure: UNICOMPARTMENTAL LEFT KNEE;  Surgeon: Nilda Simmer, MD;  Location: [facility] OR;  Service: Orthopedics;  Laterality: Left;   UMBILICAL HERNIA REPAIR  07/24/2015   w/ small ventral ex mesh    Family History  Problem Relation Age of Onset   Alzheimer's  disease Mother    Hypertension Father    Diabetes Brother        ?   Alcohol abuse Maternal Grandfather    Cancer Paternal Grandmother    Heart disease Paternal Grandfather        MI   Social History:  reports that he has been smoking cigarettes. He has a 15 pack-year smoking history. He has never used smokeless tobacco. He reports current alcohol use. He reports that he does not use drugs.  Allergies: No Known Allergies  No medications prior to admission.    Results for orders placed or performed during the hospital encounter of 10/15/23 (from the past 48 hours)  Hemoglobin A1c per protocol     Status: Abnormal   Collection Time: 10/15/23  8:52 AM  Result Value Ref Range   Hgb A1c  MFr Bld 9.2 (H) 4.8 - 5.6 %    Comment: (NOTE) Pre diabetes:          5.7%-6.4%  Diabetes:              >6.4%  Glycemic control for   <7.0% adults with diabetes    Mean Plasma Glucose 217.34 mg/dL    Comment: Performed at [facility], [address], [place], [place] [address]  Basic metabolic panel per protocol     Status: Abnormal   Collection Time: 10/15/23  8:52 AM  Result Value Ref Range   Sodium 137 135 - 145 mmol/L   Potassium 4.6 3.5 - 5.1 mmol/L   Chloride 103 98 - 111 mmol/L   CO2 26 22 - 32 mmol/L   Glucose, Bld 187 (H) 70 - 99 mg/dL    Comment: Glucose reference range applies only to samples taken after fasting for at least 8 hours.   BUN 16 8 - 23 mg/dL   Creatinine, Ser 8.11 0.61 - 1.24 mg/dL   Calcium 9.1 8.9 - 91.4 mg/dL   GFR, Estimated >78 >29 mL/min    Comment: (NOTE) Calculated using the CKD-EPI Creatinine Equation (2021)    Anion gap 8 5 - 15    Comment: Performed at [facility], [address] [place], [place] [address]  CBC per protocol     Status: Abnormal   Collection Time: 10/15/23  8:52 AM  Result Value Ref Range   WBC 7.3 4.0 - 10.5 K/uL   RBC 4.10 (L) 4.22 - 5.81 MIL/uL   Hemoglobin 13.4 13.0 - 17.0 g/dL   HCT 08.6 (L) 57.8 -  52.0 %   MCV 93.4 80.0 - 100.0 fL   MCH 32.7 26.0 - 34.0 pg   MCHC 35.0 30.0 - 36.0 g/dL   RDW 46.9 62.9 - 52.8 %   Platelets 215 150 - 400 K/uL   nRBC 0.0 0.0 - 0.2 %    Comment: Performed at [facility], [address] [place], [place] [address]  Glucose, capillary     Status: Abnormal   Collection Time: 10/15/23  8:53 AM  Result Value Ref Range   Glucose-Capillary 164 (H) 70 - 99 mg/dL    Comment: Glucose reference range applies only to samples taken after fasting for at least 8 hours.   No results found.  Review of Systems  Constitutional:  Negative for chills and fever.  All other systems reviewed and are negative.   There were no vitals taken for this visit. Physical Exam Vitals reviewed.  HENT:     Head: Normocephalic.  Eyes:     Pupils: Pupils are equal, round, and reactive to light.  Cardiovascular:     Rate and Rhythm: Normal rate.  Pulmonary:     Effort: Pulmonary effort is normal.  Abdominal:     General: Abdomen is flat.  Genitourinary:    Comments: No VCAT at present Musculoskeletal:        General: Normal range of motion.     Cervical back: Normal range of motion.  Skin:    General: Skin is warm.  Neurological:     General: No focal deficit present.     Mental Status: He is alert.  Psychiatric:        Mood and Affect: Mood normal.      Assessment/Plan  Proceed as planned with TURP. Risks, benefits, alternatives, expected peri-op course discussed previously and reiterated today. He understands  that his diabetes with fair control increases risk of ALL peri-op complications including severe infection.   Loletta Parish., MD 10/16/2023, 7:28 AM

## 2023-10-16 NOTE — Anesthesia Procedure Notes (Addendum)
 Procedure Name: LMA Insertion Date/Time: 10/16/2023 12:46 PM  Performed by: Sharyn Dross, CRNAPre-anesthesia Checklist: Patient identified, Emergency Drugs available, Suction available and Patient being monitored Patient Re-evaluated:Patient Re-evaluated prior to induction Oxygen Delivery Method: Circle system utilized Preoxygenation: Pre-oxygenation with 100% oxygen Induction Type: IV induction Ventilation: Mask ventilation without difficulty LMA: LMA inserted LMA Size: 5.0 Number of attempts: 1 Placement Confirmation: positive ETCO2 and breath sounds checked- equal and bilateral Tube secured with: Tape Dental Injury: Teeth and Oropharynx as per pre-operative assessment

## 2023-10-16 NOTE — Transfer of Care (Signed)
 Immediate Anesthesia Transfer of Care Note  Patient: Nathan Hess  Procedure(s) Performed: TURP (TRANSURETHRAL RESECTION OF PROSTATE)  Patient Location: PACU  Anesthesia Type:General  Level of Consciousness: awake, alert , and oriented  Airway & Oxygen Therapy: Patient connected to face mask oxygen  Post-op Assessment: Report given to RN and Post -op Vital signs reviewed and stable  Post vital signs: Reviewed and stable   Last Vitals:  Vitals Value Taken Time  BP 113/76   Temp    Pulse 88 10/16/23 1356  Resp 18 10/16/23 1356  SpO2 98 % 10/16/23 1356  Vitals shown include unfiled device data.  Last Pain:  Vitals:   10/16/23 1100  TempSrc:   PainSc: 0-No pain         Complications: There were no known notable events for this encounter.

## 2023-10-17 ENCOUNTER — Encounter (HOSPITAL_COMMUNITY): Payer: Self-pay | Admitting: Urology

## 2023-10-17 DIAGNOSIS — N401 Enlarged prostate with lower urinary tract symptoms: Secondary | ICD-10-CM | POA: Diagnosis not present

## 2023-10-17 LAB — BASIC METABOLIC PANEL WITH GFR
Anion gap: 7 (ref 5–15)
BUN: 14 mg/dL (ref 8–23)
CO2: 26 mmol/L (ref 22–32)
Calcium: 8.6 mg/dL — ABNORMAL LOW (ref 8.9–10.3)
Chloride: 100 mmol/L (ref 98–111)
Creatinine, Ser: 0.81 mg/dL (ref 0.61–1.24)
GFR, Estimated: >60 mL/min (ref >60)
Glucose, Bld: 203 mg/dL — ABNORMAL HIGH (ref 70–99)
Potassium: 3.9 mmol/L (ref 3.5–5.1)
Sodium: 133 mmol/L — ABNORMAL LOW (ref 135–145)

## 2023-10-17 LAB — SURGICAL PATHOLOGY

## 2023-10-17 LAB — GLUCOSE, CAPILLARY: Glucose-Capillary: 177 mg/dL — ABNORMAL HIGH (ref 70–99)

## 2023-10-17 LAB — HEMOGLOBIN AND HEMATOCRIT, BLOOD
HCT: 34.8 % — ABNORMAL LOW (ref 39.0–52.0)
Hemoglobin: 12.1 g/dL — ABNORMAL LOW (ref 13.0–17.0)

## 2023-10-17 MED ORDER — CEPHALEXIN 500 MG PO CAPS: 500.0000 mg | ORAL_CAPSULE | Freq: Two times a day (BID) | ORAL | 0 refills | Status: DC

## 2023-10-17 MED ORDER — SENNOSIDES-DOCUSATE SODIUM 8.6-50 MG PO TABS: 1.0000 | ORAL_TABLET | Freq: Two times a day (BID) | ORAL | 0 refills | Status: DC

## 2023-10-17 MED ORDER — OXYCODONE-ACETAMINOPHEN 5-325 MG PO TABS: 1.0000 | ORAL_TABLET | Freq: Four times a day (QID) | ORAL | 0 refills | Status: DC | PRN

## 2023-10-17 NOTE — Op Note (Signed)
 NAME: Nathan Hess, KERCE MEDICAL RECORD NO: [id number] ACCOUNT NO: [id number] DATE OF BIRTH: 1951-03-29 FACILITY: [facility] LOCATION: [id number] PHYSICIAN: Sebastian Ache, MD  Operative Report   SURGEON:  Sebastian Ache, MD.  PREOPERATIVE DIAGNOSIS:  Prostatic hypertrophy with refractory lower urinary tract symptoms.  PROCEDURE PERFORMED:  Transurethral resection of the prostate.  ESTIMATED BLOOD LOSS:  50 mL.  COMPLICATIONS: None.  SPECIMENS: Prostate chips for permanent pathology.  FINDINGS: 1.  Trilobar prostatic hypertrophy with moderate bladder trabeculation. 2.  Wide open urinary channel from the bladder neck through the verumontanum following transurethral resection.  DRAINS: 1.  24-French 3-way Foley catheter. 2.  Normal saline irrigation.  INDICATIONS:  The patient is a pleasant 73 year old male with a longstanding history of obstructive and irritative voiding.  He has been on maximum medical therapy with 5-alpha reductase inhibitors and alpha-blockers for a number of years. Despite this,  he has had progression of symptoms. Imaging revealed an approximately 90 g prostate with significant trilobar hypertrophy. Cystoscopy ruled out any strictures, and pressure flow studies corroborated a high pressure, low flow state, all consistent with  obstruction from a prostatic source.  Options were discussed for further management, including continued medical therapy versus chronic catheters versus outlet procedure, and he wishes to proceed with TURP given his trilobar anatomy.  He presents for this  today.  Informed consent was obtained and placed in the medical record.  PROCEDURE IN DETAIL:  The patient being [name] verified and procedure being transurethral resection of the prostate was confirmed.  Procedure timeout was performed.  Intravenous antibiotics were administered.  General LMA anesthesia was  induced.  The patient was placed into a low lithotomy position.  A sterile  field was created, prepped, and draped the patient's penis, perineum and proximal thighs using iodine. Cystourethroscopy was performed using a 26-French resectoscope sheath with  visual obturator.  Inspection of anterior and posterior urethra only revealed significant trilobar prostatic hypertrophy.  The bladder was moderately trabeculated.  The ureteral orifices were unremarkable.  Using a medium resectoscope loop, initial  resection was performed at the 12 o'clock position from the bladder neck to the verumontanum, creating an irrigation channel at the 12 o'clock position.  Next, the median lobe was resected in a top-down fashion, taking exquisite care to avoid undermining  of the bladder neck or any damage to the ureteral orifice, which did not occur.  The median lobe was quite large,____ quarter of the prostate volume.  Next, the left lobe was resected from the bladder neck to the verumontanum in a similar top-down  fashion to the superficial fibromuscular stroma of the prostatic capsule, followed by the right lobe.  This generated innumerable prostate chips which were then irrigated and set aside for pathologic analysis. The entire resection base was then  infiltrated using coagulation current in a descending spiral fashion, which resulted in excellent hemostasis.  No evidence of perforation.  No evidence of residual prostate chips.  A 0.03 sensor wire was placed through the sheath to level of the urinary bladder, through which a  24-French three-way Foley catheter was carefully placed using Seldinger technique to minimize trauma to the bladder neck with 30 mL sterile water in the balloon and connected to normal saline irrigation. The procedure was terminated. The patient tolerated the  procedure well.  No immediate periprocedural complications.  The patient was taken to post anesthesia care unit in stable condition.  Plan for observation admission.   NIK D: 10/16/2023 1:50:00 pm T: 10/17/2023  1:31:00  am  JOB: [id number]/ [id number]

## 2023-10-17 NOTE — Care Management Obs Status (Signed)
 MEDICARE OBSERVATION STATUS NOTIFICATION   Patient Details  Name: Nathan Hess MRN: [id number] Date of Birth: 1950-08-08   Medicare Observation Status Notification Given:  Yes    MahabirOlegario Messier, RN 10/17/2023, 1:11 PM

## 2023-10-17 NOTE — TOC Initial Note (Signed)
 Transition of Care [facility]) - Initial/Assessment Note    Patient Details  Name: Nathan Hess MRN: [id number] Date of Birth: 02-May-1951  [facility] CM/SW Contact:    Lanier Clam, RN Phone Number: 10/17/2023, 11:24 AM  Clinical Narrative:  d/c plan home.                 Expected Discharge Plan: Home/Self Care Barriers to Discharge: Continued Medical Work up   Patient Goals and CMS Choice Patient states their goals for this hospitalization and ongoing recovery are:: Home CMS Medicare.gov Compare Post Acute Care list provided to:: Patient Choice offered to / list presented to : Patient [place] ownership interest in [facility].provided to:: Patient    Expected Discharge Plan and Services   Discharge Planning Services: CM Consult   Living arrangements for the past 2 months: Single Family Home                                      Prior Living Arrangements/Services Living arrangements for the past 2 months: Single Family Home Lives with:: Spouse                   Activities of Daily Living   ADL Screening (condition at time of admission) Independently performs ADLs?: Yes (appropriate for developmental age) Is the patient deaf or have difficulty hearing?: No Does the patient have difficulty seeing, even when wearing glasses/contacts?: No Does the patient have difficulty concentrating, remembering, or making decisions?: No  Permission Sought/Granted                  Emotional Assessment              Admission diagnosis:  Enlarged prostate with urinary retention [N40.1, R33.8] Prostatic hyperplasia [N40.0] Patient Active Problem List   Diagnosis Date Noted   Prostatic hyperplasia 10/16/2023   Arthritis 10/02/2023   Heartburn 10/02/2023   Right-sided chest pain 10/02/2023   RUQ pain 10/02/2023   Muscle weakness 05/14/2023   Fatty liver 10/24/2021   Other diseases of stomach and duodenum 07/03/2021   Acid  reflux 06/27/2021   Right knee pain 06/27/2021   Elevated PSA 04/06/2021   Sun-damaged skin 09/28/2020   Restless sleeper 09/28/2020   Insomnia 09/27/2020   Enlarged prostate    Polyp of colon 02/10/2020   Constipation 05/25/2019   Gallstone 10/06/2018   AAA (abdominal aortic aneurysm) (HCC) 10/06/2018   Seborrheic keratoses 04/03/2018   Hearing loss in left ear 01/08/2017   Atopic dermatitis 11/26/2016   Salivary gland stone 02/28/2016   History of colonic polyps 06/26/2015   Benign neoplasm of sigmoid colon 10/29/2014   Preventative health care 09/22/2014   Tiredness 04/25/2014   Lumbago 04/25/2014   Need for prophylactic vaccination and inoculation against influenza 05/13/2013   Left knee DJD    Other malaise and fatigue 11/01/2012   Tobacco use disorder 10/30/2012   Migraine headache 08/03/2012   Hypertension 09/24/2011   Anxiety 09/24/2011   Hypogonadism male 09/24/2011   Reduced libido 08/20/2011   Erectile dysfunction 06/13/2010   Type 2 diabetes mellitus (HCC) 04/10/2010   Headache 04/10/2010   Prostatic hypertrophy 04/10/2010   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   Hypercholesterolemia 04/11/2007   Depression 04/11/2007   Obesity 04/11/2007   PCP:  Bradd Canary, MD Pharmacy:   [phone] - [place], [place] - [address] [place] [address] Phone: [phone] Fax: [phone]     Social Drivers of Health (SDOH) Social History: SDOH Screenings   Food Insecurity: No Food Insecurity (10/16/2023)  Housing: Low Risk  (10/16/2023)  Transportation Needs: No Transportation Needs (10/16/2023)  Utilities: Not At Risk (10/16/2023)  Depression (PHQ2-9): Low Risk  (01/08/2023)  Social Connections: Socially Integrated (10/16/2023)  Tobacco Use: High Risk (10/16/2023)   SDOH Interventions:     Readmission Risk Interventions     No data to display

## 2023-10-17 NOTE — TOC Transition Note (Signed)
 Transition of Care [facility]) - Discharge Note   Patient Details  Name: Nathan Hess MRN: [id number] Date of Birth: 05/12/1951  [facility] of [facility]) CM/SW Contact:  Lanier Clam, RN Phone Number: 10/17/2023, 1:12 PM   Clinical Narrative:  d/c home. No CM needs.     Final next level of care: Home/Self Care Barriers to Discharge: No Barriers Identified   Patient Goals and CMS Choice Patient states their goals for this hospitalization and ongoing recovery are:: Home CMS Medicare.gov Compare Post Acute Care list provided to:: Patient Choice offered to / list presented to : Patient [place] ownership interest in [facility].provided to:: Patient    Discharge Placement                       Discharge Plan and Services Additional resources added to the After Visit Summary for     Discharge Planning Services: CM Consult                                 Social Drivers of Health (SDOH) Interventions SDOH Screenings   Food Insecurity: No Food Insecurity (10/16/2023)  Housing: Low Risk  (10/16/2023)  Transportation Needs: No Transportation Needs (10/16/2023)  Utilities: Not At Risk (10/16/2023)  Depression (PHQ2-9): Low Risk  (01/08/2023)  Social Connections: Socially Integrated (10/16/2023)  Tobacco Use: High Risk (10/16/2023)     Readmission Risk Interventions     No data to display

## 2023-10-17 NOTE — Progress Notes (Signed)
 Mobility Specialist - Progress Note   10/17/23 1036  Mobility  Activity Ambulated independently in hallway  Level of Assistance Independent  Assistive Device None  Distance Ambulated (ft) 500 ft  Activity Response Tolerated well  Mobility Referral Yes  Mobility visit 1 Mobility  Mobility Specialist Start Time (ACUTE ONLY) 1026  Mobility Specialist Stop Time (ACUTE ONLY) 1036  Mobility Specialist Time Calculation (min) (ACUTE ONLY) 10 min   Pt received in room standing and agreeable to mobility. No complaints during session. Pt to bed after session with all needs met.    [facility]

## 2023-10-17 NOTE — Discharge Summary (Signed)
 Physician Discharge Summary  Patient ID: Nathan Hess MRN: [id number] DOB/AGE: 73-27-1952 73 y.o.  Admit date: 10/16/2023 Discharge date: 10/17/2023  Admission Diagnoses: Prostatic hyperplasia (med refractory)  Discharge Diagnoses:  Principal Problem:   Prostatic hyperplasia   Discharged Condition: good  Hospital Course: Pt underwent transurethral resection of prostate on 10/16/23, the day of admission, without acute complication. Observed on 4th floor Urology service post-op. By the ealry afternoon of 3/27, the day of discharge, he is ambulatory, tollerating PO diet, pain controlled on PO meds, and felt to be adequate for discharge. Path pending, Cr 0.8, Hgb 12.1 at discharge. He will go home with catheter.   Consults: None  Significant Diagnostic Studies: labs: as per above  Treatments: surgery: as per above.   Discharge Exam: Blood pressure 110/60, pulse 69, temperature 99.1 F (37.3 C), temperature source Oral, resp. rate 16, height 5\' 11"  (1.803 m), weight 100.3 kg, SpO2 93%.  NAD, AOx3 Non-labored breathing on RA RRR SNTND, stable mild obesity Foley c/d/I with medium yellow urine off irrigation NO c/c/e   Disposition:  HOME   Allergies as of 10/17/2023   No Known Allergies      Medication List     STOP taking these medications    tamsulosin 0.4 MG Caps capsule Commonly known as: FLOMAX       TAKE these medications    acetaminophen 500 MG tablet Commonly known as: TYLENOL Take 1,000 mg by mouth every 6 (six) hours as needed for moderate pain (pain score 4-6).   aspirin EC 81 MG tablet Take 1 tablet (81 mg total) by mouth daily. Swallow whole.   busPIRone 5 MG tablet Commonly known as: BUSPAR TAKE 1 TABLET BY MOUTH THREE TIMES A DAY What changed: when to take this   cephALEXin 500 MG capsule Commonly known as: KEFLEX Take 1 capsule (500 mg total) by mouth 2 (two) times daily. X 5 days to prevent post-op infection   finasteride 5 MG  tablet Commonly known as: PROSCAR Take 5 mg by mouth daily.   metFORMIN 500 MG tablet Commonly known as: GLUCOPHAGE Take 1 tablet (500 mg total) by mouth 4 (four) times daily. Take 4 tabs a day , take 2 in am, one in afternoon and 1 in pm. What changed:  when to take this additional instructions   oxyCODONE-acetaminophen 5-325 MG tablet Commonly known as: Percocet Take 1 tablet by mouth every 6 (six) hours as needed for severe pain (pain score 7-10) or moderate pain (pain score 4-6) (post-operatively).   rosuvastatin 20 MG tablet Commonly known as: CRESTOR TAKE 1 TABLET BY MOUTH EVERY DAY   senna-docusate 8.6-50 MG tablet Commonly known as: Senokot-S Take 1 tablet by mouth 2 (two) times daily. While taking strong pain meds to prevent constipation.   telmisartan-hydrochlorothiazide 40-12.5 MG tablet Commonly known as: MICARDIS HCT Take 1 tablet by mouth daily.   terbinafine 250 MG tablet Commonly known as: LAMISIL Take 250 mg by mouth See admin instructions. Take 250 mg daily for 7 days then off for 21 days   triamcinolone cream 0.1 % Commonly known as: KENALOG Apply 1 application topically as needed. What changed: reasons to take this        Follow-up Information     Manny, Delbert Phenix., MD Follow up on 10/21/2023.   Specialty: Urology Why: at 8:45 for MD visit, pathology review, and catheter removal. Contact information: [address] [place] [address] [phone]  Signed: Loletta Parish. 10/17/2023, 11:09 AM

## 2023-10-17 NOTE — Discharge Instructions (Signed)
1 - You may have urinary urgency (bladder spasms) and bloody urine on / off for up to 2 weeks.  This is normal.  2 - Call MD or go to ER for fever >102, severe pain / nausea / vomiting not relieved by medications, or acute change in medical status  

## 2023-10-17 NOTE — Progress Notes (Signed)
 Reviewed written d/c instructions w pt and his wife, all questions answered and they both verbalized understanding. Foley care also reviewed w pt and wife. D/C via w/c w all belongings in stable condition.

## 2023-10-17 NOTE — Plan of Care (Signed)
  Problem: Education: Goal: Ability to describe self-care measures that may prevent or decrease complications (Diabetes Survival Skills Education) will improve Outcome: Progressing Goal: Individualized Educational Video(s) Outcome: Progressing   Problem: Coping: Goal: Ability to adjust to condition or change in health will improve Outcome: Progressing   Problem: Fluid Volume: Goal: Ability to maintain a balanced intake and output will improve Outcome: Progressing   Problem: Health Behavior/Discharge Planning: Goal: Ability to identify and utilize available resources and services will improve Outcome: Progressing Goal: Ability to manage health-related needs will improve Outcome: Progressing   Problem: Metabolic: Goal: Ability to maintain appropriate glucose levels will improve Outcome: Progressing   Problem: Nutritional: Goal: Maintenance of adequate nutrition will improve Outcome: Progressing Goal: Progress toward achieving an optimal weight will improve Outcome: Progressing   Problem: Tissue Perfusion: Goal: Adequacy of tissue perfusion will improve Outcome: Progressing   Problem: Education: Goal: Knowledge of General Education information will improve Description: Including pain rating scale, medication(s)/side effects and non-pharmacologic comfort measures Outcome: Progressing   Problem: Health Behavior/Discharge Planning: Goal: Ability to manage health-related needs will improve Outcome: Progressing

## 2023-10-22 ENCOUNTER — Ambulatory Visit

## 2023-11-12 ENCOUNTER — Ambulatory Visit: Attending: Cardiology

## 2023-11-12 DIAGNOSIS — I10 Essential (primary) hypertension: Secondary | ICD-10-CM | POA: Diagnosis not present

## 2023-11-12 DIAGNOSIS — I714 Abdominal aortic aneurysm, without rupture, unspecified: Secondary | ICD-10-CM

## 2023-11-12 DIAGNOSIS — E782 Mixed hyperlipidemia: Secondary | ICD-10-CM

## 2023-11-12 DIAGNOSIS — I7143 Infrarenal abdominal aortic aneurysm, without rupture: Secondary | ICD-10-CM

## 2023-12-18 ENCOUNTER — Ambulatory Visit: Payer: Self-pay | Admitting: Cardiology

## 2024-01-08 ENCOUNTER — Ambulatory Visit (HOSPITAL_BASED_OUTPATIENT_CLINIC_OR_DEPARTMENT_OTHER)
Admission: RE | Admit: 2024-01-08 | Discharge: 2024-01-08 | Disposition: A | Discharge status: Home / Self Care | Source: Ambulatory Visit | Attending: Family Medicine | Admitting: Family Medicine

## 2024-01-08 DIAGNOSIS — Z122 Encounter for screening for malignant neoplasm of respiratory organs: Secondary | ICD-10-CM | POA: Insufficient documentation

## 2024-01-08 DIAGNOSIS — Z87891 Personal history of nicotine dependence: Secondary | ICD-10-CM | POA: Insufficient documentation

## 2024-01-08 DIAGNOSIS — F17210 Nicotine dependence, cigarettes, uncomplicated: Secondary | ICD-10-CM | POA: Diagnosis present

## 2024-01-09 ENCOUNTER — Other Ambulatory Visit (INDEPENDENT_AMBULATORY_CARE_PROVIDER_SITE_OTHER)

## 2024-01-09 ENCOUNTER — Ambulatory Visit: Admitting: *Deleted

## 2024-01-09 VITALS — BP 106/50 | HR 74 | Temp 97.9°F | Resp 18 | Ht 71.0 in | Wt 219.0 lb

## 2024-01-09 DIAGNOSIS — E1165 Type 2 diabetes mellitus with hyperglycemia: Secondary | ICD-10-CM

## 2024-01-09 DIAGNOSIS — Z0000 Encounter for general adult medical examination without abnormal findings: Secondary | ICD-10-CM

## 2024-01-09 LAB — MICROALBUMIN / CREATININE URINE RATIO
Creatinine,U: 92.8 mg/dL
Microalb Creat Ratio: 39.2 mg/g — ABNORMAL HIGH (ref 0.0–30.0)
Microalb, Ur: 3.6 mg/dL — ABNORMAL HIGH (ref 0.0–1.9)

## 2024-01-09 MED ORDER — METFORMIN HCL 500 MG PO TABS: 500.0000 mg | ORAL_TABLET | Freq: Four times a day (QID) | ORAL | Status: DC

## 2024-01-09 NOTE — Progress Notes (Signed)
 Subjective:   Nathan Hess is a 73 y.o. who presents for a Medicare Wellness preventive visit.  As a reminder, Annual Wellness Visits don't include a physical exam, and some assessments may be limited, especially if this visit is performed virtually. We may recommend an in-person follow-up visit with your provider if needed.  Visit Complete: In person  Persons Participating in Visit: Patient.  AWV Questionnaire: No: Patient Medicare AWV questionnaire was not completed prior to this visit.  Cardiac Risk Factors include: hypertension;diabetes mellitus;dyslipidemia;male gender;advanced age (>40men, >12 women);obesity (BMI >30kg/m2)     Objective:    Today's Vitals   01/09/24 0813 01/09/24 0903  BP: (!) 128/47 (!) 106/50  Pulse: 74   Resp: 18   Temp: 97.9 F (36.6 C)   TempSrc: Oral   SpO2: 97%   Weight: 219 lb (99.3 kg)   Height: 5' 11 (1.803 m)    Body mass index is 30.54 kg/m.     01/09/2024    8:43 AM 10/16/2023   10:57 AM 10/15/2023    9:12 AM 03/30/2013   11:30 AM  Advanced Directives  Does Patient Have a Medical Advance Directive? Yes Yes Yes Patient has advance directive, copy in chart   Type of Advance Directive Healthcare Power of [place];Living will Healthcare Power of [place];Living will Healthcare Power of [place];Living will Healthcare Power of [place];Living will   Does patient want to make changes to medical advance directive? No - Patient declined No - Patient declined    Copy of Healthcare Power of Attorney in Chart? No - copy requested No - copy requested       Data saved with a previous flowsheet row definition    Current Medications (verified) Outpatient Encounter Medications as of 01/09/2024  Medication Sig   acetaminophen  (TYLENOL ) 500 MG tablet Take 1,000 mg by mouth every 6 (six) hours as needed for moderate pain (pain score 4-6).   aspirin  EC 81 MG tablet Take 1 tablet (81 mg total) by mouth daily. Swallow whole.   finasteride   (PROSCAR ) 5 MG tablet Take 5 mg by mouth daily.   metFORMIN  (GLUCOPHAGE ) 500 MG tablet Take 1 tablet (500 mg total) by mouth 4 (four) times daily. Take 4 tabs a day , take 2 in am, one in afternoon and 1 in pm.   rosuvastatin  (CRESTOR ) 20 MG tablet TAKE 1 TABLET BY MOUTH EVERY DAY   telmisartan -hydrochlorothiazide  (MICARDIS  HCT) 40-12.5 MG tablet Take 1 tablet by mouth daily.   triamcinolone  (KENALOG ) 0.1 % Apply 1 application topically as needed. (Patient taking differently: Apply 1 application  topically as needed (rash).)   [DISCONTINUED] senna-docusate (SENOKOT-S) 8.6-50 MG tablet Take 1 tablet by mouth 2 (two) times daily. While taking strong pain meds to prevent constipation.   busPIRone  (BUSPAR ) 5 MG tablet TAKE 1 TABLET BY MOUTH THREE TIMES A DAY (Patient not taking: Reported on 01/09/2024)   [DISCONTINUED] cephALEXin  (KEFLEX ) 500 MG capsule Take 1 capsule (500 mg total) by mouth 2 (two) times daily. X 5 days to prevent post-op infection (Patient not taking: Reported on 01/09/2024)   [DISCONTINUED] metFORMIN  (GLUCOPHAGE ) 500 MG tablet Take 1 tablet (500 mg total) by mouth 4 (four) times daily. Take 4 tabs a day , take 2 in am, one in afternoon and 1 in pm. (Patient taking differently: Take 500 mg by mouth daily.)   [DISCONTINUED] oxyCODONE -acetaminophen  (PERCOCET) 5-325 MG tablet Take 1 tablet by mouth every 6 (six) hours as needed for severe pain (pain score 7-10) or moderate  pain (pain score 4-6) (post-operatively).   [DISCONTINUED] terbinafine  (LAMISIL ) 250 MG tablet Take 250 mg by mouth See admin instructions. Take 250 mg daily for 7 days then off for 21 days   No facility-administered encounter medications on file as of 01/09/2024.    Allergies (verified) Patient has no known allergies.   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   Heartburn 10/02/2023   History of colonic polyps 06/26/2015   3rd colonoscopy in 2016 with Dr Leamon Pringle in [place]   Hyperlipidemia 05/25/2012   Hypogonadism male 09/24/2011   Insomnia 09/27/2020   Left knee DJD    Lumbago 04/25/2014   Migraine headache 08/03/2012   Muscle weakness 05/14/2023   Obesity 04/11/2007   Pneumonia    Polyp of colon 02/10/2020   Restless sleeper 09/28/2020   Right knee pain 06/27/2021   Right-sided chest pain 10/02/2023   RUQ pain 10/02/2023   Salivary gland stone 02/28/2016   Seborrheic keratoses 04/03/2018   Tobacco use disorder 10/30/2012   Type 2 diabetes mellitus (HCC) 04/10/2010   Past Surgical History:  Procedure Laterality Date   FINGER FRACTURE SURGERY Left    left hand surgery     PARTIAL KNEE ARTHROPLASTY Left 04/06/2013   Procedure: UNICOMPARTMENTAL LEFT KNEE;  Surgeon: Genevie Kerns, MD;  Location: [facility] OR;  Service: Orthopedics;  Laterality: Left;   TRANSURETHRAL RESECTION OF PROSTATE N/A 10/16/2023   Procedure: TURP (TRANSURETHRAL RESECTION OF PROSTATE);  Surgeon: Melody Spurling., MD;  Location: [facility] ORS;  Service: Urology;  Laterality: N/A;   UMBILICAL HERNIA REPAIR  07/24/2015   w/ small ventral ex mesh   Family History  Problem Relation Age of Onset   Alzheimer's disease Mother    Hypertension Father    Diabetes Brother        ?   Alcohol abuse Maternal Grandfather    Cancer Paternal Grandmother    Heart disease Paternal Grandfather        MI   Social History   Socioeconomic History   Marital status: Married    Spouse name: Not on file   Number of children: Not on file   Years of education: Not on file   Highest education level: Not on file  Occupational History   Not on file  Tobacco Use   Smoking  status: Every Day    Current packs/day: 1.00    Average packs/day: 1 pack/day for 38.5 years (38.5 ttl pk-yrs)    Types: Cigarettes    Start date: 47   Smokeless tobacco: Never  Vaping Use   Vaping status: Some Days  Substance and Sexual Activity   Alcohol use: Yes    Comment: 1 beer about every 2 months    Drug use: No   Sexual activity: Yes    Birth control/protection: Condom  Other Topics Concern   Not on file  Social History Narrative   Not on file   Social Drivers of Health   Financial Resource Strain: Low Risk  (01/09/2024)   Overall Financial Resource Strain (CARDIA)    Difficulty of Paying Living Expenses: Not hard at all  Food Insecurity:  No Food Insecurity (01/09/2024)   Hunger Vital Sign    Worried About Running Out of Food in the Last Year: Never true    Ran Out of Food in the Last Year: Never true  Transportation Needs: No Transportation Needs (01/09/2024)   PRAPARE - Administrator, Civil Service (Medical): No    Lack of Transportation (Non-Medical): No  Physical Activity: Inactive (01/09/2024)   Exercise Vital Sign    Days of Exercise per Week: 0 days    Minutes of Exercise per Session: 0 min  Stress: No Stress Concern Present (01/09/2024)   [employer] of Occupational Health - Occupational Stress Questionnaire    Feeling of Stress: Not at all  Social Connections: Moderately Integrated (01/09/2024)   Social Connection and Isolation Panel    Frequency of Communication with Friends and Family: More than three times a week    Frequency of Social Gatherings with Friends and Family: More than three times a week    Attends Religious Services: Never    Database administrator or Organizations: Yes    Attends Engineer, structural: 1 to 4 times per year    Marital Status: Married    Tobacco Counseling Ready to quit: Not Answered Counseling given: Not Answered    Clinical Intake:  Pre-visit preparation completed: Yes  Pain :  No/denies pain     BMI - recorded: 38.54 Nutritional Status: BMI > 30  Obese Diabetes: Yes CBG done?: No Did pt. bring in CBG monitor from home?: No  Lab Results  Component Value Date   HGBA1C 9.2 (H) 10/15/2023   HGBA1C 7.4 (H) 01/08/2023   HGBA1C 6.9 (H) 06/28/2022     How often do you need to have someone help you when you read instructions, pamphlets, or other written materials from your doctor or pharmacy?: 1 - Never What is the last grade level you completed in school?: Bachelor's  Interpreter Needed?: No  Information entered by :: Susa Engman, CMA   Activities of Daily Living     01/09/2024    8:33 AM 10/16/2023    5:14 PM  In your present state of health, do you have any difficulty performing the following activities:  Hearing? 0   Vision? 0   Difficulty concentrating or making decisions? 0   Walking or climbing stairs? 0   Dressing or bathing? 0   Doing errands, shopping? 0 0  Preparing Food and eating ? N   Using the Toilet? N   In the past six months, have you accidently leaked urine? Y   Comment Has had prostate surgery and now resolved.   Do you have problems with loss of bowel control? N   Managing your Medications? N   Managing your Finances? N   Housekeeping or managing your Housekeeping? N     Patient Care Team: Neda Balk, MD as PCP - General (Family Medicine)  I have updated your Care Teams any recent Medical Services you may have received from other providers in the past year.     Assessment:   This is a routine wellness examination for Nathan Hess.  Hearing/Vision screen Hearing Screening - Comments:: Denies hearing difficulties.  Vision Screening - Comments:: Wears RX glasses -- up to date with routine eye exams.    Goals Addressed               This Visit's Progress     Patient Stated (pt-stated)  He wants to start walking 2 miles a day.       Depression Screen     01/09/2024   11:33 AM 01/08/2023    9:07  AM 06/28/2022    9:02 AM 10/24/2021    8:38 AM 10/24/2021    8:37 AM 04/06/2021    8:55 AM 09/27/2020   10:02 AM  PHQ 2/9 Scores  PHQ - 2 Score 0 0 0 0 0 0 1  PHQ- 9 Score  0  0   3    Fall Risk     01/09/2024    8:33 AM 01/08/2023    9:08 AM 06/28/2022    9:01 AM 10/24/2021    8:37 AM 04/06/2021    8:56 AM  Fall Risk   Falls in the past year? 0 0 0 0 0  Number falls in past yr: 0 0 0 0 0  Injury with Fall? 0 0 0 0 0  Risk for fall due to : No Fall Risks   No Fall Risks No Fall Risks  Follow up Falls evaluation completed Falls evaluation completed Falls evaluation completed  Falls evaluation completed  Falls evaluation completed      Data saved with a previous flowsheet row definition    MEDICARE RISK AT HOME:  Medicare Risk at Home Any stairs in or around the home?: Yes If so, are there any without handrails?: No Home free of loose throw rugs in walkways, pet beds, electrical cords, etc?: Yes Adequate lighting in your home to reduce risk of falls?: Yes Life alert?: No Use of a cane, walker or w/c?: No Grab bars in the bathroom?: No Shower chair or bench in shower?: Yes Elevated toilet seat or a handicapped toilet?: Yes  TIMED UP AND GO:  Was the test performed?  Yes  Length of time to ambulate 10 feet: 5 sec Gait steady and fast without use of assistive device  Cognitive Function: 6CIT completed        01/09/2024    8:44 AM  6CIT Screen  What Year? 0 points  What month? 0 points  What time? 0 points  Count back from 20 0 points  Months in reverse 0 points  Repeat phrase 0 points  Total Score 0 points    Immunizations Immunization History  Administered Date(s) Administered   Influenza, High Dose Seasonal PF 09/10/2017, 04/03/2018, 04/09/2019, 04/09/2019, 04/06/2021, 05/14/2022, 03/24/2023, 05/24/2023   Influenza, Seasonal, Injecte, Preservative Fre 07/31/2012   Influenza,inj,Quad PF,6+ Mos 05/13/2013, 04/23/2014, 03/29/2015   Influenza-Unspecified 05/01/2011,  03/25/2020   PFIZER Comirnaty(Gray Top)Covid-19 Tri-Sucrose Vaccine 10/26/2020   PFIZER(Purple Top)SARS-COV-2 Vaccination 09/08/2019, 09/29/2019, 05/18/2020   Pfizer Covid-19 Vaccine Bivalent Booster 36yrs & up 04/10/2021, 08/06/2021   Pneumococcal Conjugate-13 09/21/2014   Pneumococcal Polysaccharide-23 12/27/2015, 05/12/2019   Pneumococcal-Unspecified 08/17/2011   Respiratory Syncytial Virus Vaccine,Recomb Aduvanted(Arexvy) 05/14/2022   Tdap 02/18/2009, 06/24/2015, 02/14/2018   Zoster Recombinant(Shingrix) 08/12/2018, 04/09/2019   Zoster, Live 09/21/2014    Screening Tests Health Maintenance  Topic Date Due   Medicare Annual Wellness (AWV)  Never done   Diabetic kidney evaluation - Urine ACR  06/29/2023   Lung Cancer Screening  01/07/2024   COVID-19 Vaccine (7 - 2024-25 season) 01/08/2025 (Originally 03/24/2023)   INFLUENZA VACCINE  02/21/2024   HEMOGLOBIN A1C  04/16/2024   Diabetic kidney evaluation - eGFR measurement  10/16/2024   Colonoscopy  02/09/2025   DTaP/Tdap/Td (4 - Td or Tdap) 02/15/2028   Pneumococcal Vaccine: 50+ Years  Completed   Hepatitis C  Screening  Completed   Zoster Vaccines- Shingrix  Completed   HPV VACCINES  Aged Out   Meningococcal B Vaccine  Aged Out   FOOT EXAM  Discontinued   OPHTHALMOLOGY EXAM  Discontinued    Health Maintenance  Health Maintenance Due  Topic Date Due   Medicare Annual Wellness (AWV)  Never done   Diabetic kidney evaluation - Urine ACR  06/29/2023   Lung Cancer Screening  01/07/2024   Health Maintenance Items Addressed: Declines further COVID vaccines.                                  Completed lung cancer screening yesterday.                    Will do urine MALB today.  Additional Screening:  Vision Screening: Recommended annual ophthalmology exams for early detection of glaucoma and other disorders of the eye. Would you like a referral to an eye doctor? No    Dental Screening: Recommended annual dental exams for proper  oral hygiene  Community Resource Referral / Chronic Care Management: CRR required this visit?  No   CCM required this visit?  No   Plan:    I have personally reviewed and noted the following in the patient's chart:   Medical and social history Use of alcohol, tobacco or illicit drugs  Current medications and supplements including opioid prescriptions. Patient is not currently taking opioid prescriptions. Functional ability and status Nutritional status Physical activity Advanced directives List of other physicians Hospitalizations, surgeries, and ER visits in previous 12 months Vitals Screenings to include cognitive, depression, and falls Referrals and appointments  In addition, I have reviewed and discussed with patient certain preventive protocols, quality metrics, and best practice recommendations. A written personalized care plan for preventive services as well as general preventive health recommendations were provided to patient.   Susa Engman, CMA   01/09/2024   After Visit Summary: (In Person-Printed) AVS printed and given to the patient  Notes: Nothing significant to report at this time.

## 2024-01-09 NOTE — Patient Instructions (Addendum)
 Mr. Nathan Hess , Thank you for taking time out of your busy schedule to complete your Annual Wellness Visit with me. I enjoyed our conversation and look forward to speaking with you again next year. I, as well as your care team,  appreciate your ongoing commitment to your health goals. Please review the following plan we discussed and let me know if I can assist you in the future. Your Game plan/ To Do List    Follow up Visits: Next Medicare AWV with our clinical staff:   01/12/25 8:20 on the phone. Next Office Visit with your provider:  01/14/24 9:40 mychart video visit with Dr Rodrick Clapper.  Clinician Recommendations:  Aim for 30 minutes of exercise or brisk walking, 6-8 glasses of water, and 5 servings of fruits and vegetables each day.       This is a list of the screening recommended for you and due dates:  Health Maintenance  Topic Date Due   Medicare Annual Wellness Visit  Never done   COVID-19 Vaccine (7 - 2024-25 season) 03/24/2023   Yearly kidney health urinalysis for diabetes  06/29/2023   Screening for Lung Cancer  01/07/2024   Flu Shot  02/21/2024   Hemoglobin A1C  04/16/2024   Yearly kidney function blood test for diabetes  10/16/2024   Colon Cancer Screening  02/09/2025   DTaP/Tdap/Td vaccine (4 - Td or Tdap) 02/15/2028   Pneumococcal Vaccine for age over 34  Completed   Hepatitis C Screening  Completed   Zoster (Shingles) Vaccine  Completed   HPV Vaccine  Aged Out   Meningitis B Vaccine  Aged Out   Complete foot exam   Discontinued   Eye exam for diabetics  Discontinued    Advanced directives: (Copy Requested) Please bring a copy of your health care power of attorney and living will to the office to be added to your chart at your convenience. You can mail to [facility] [address] [place], [place] [address] or email to [email or web address] Advance Care Planning is important because it:  [x]  Makes sure you receive the medical care that is  consistent with your values, goals, and preferences  [x]  It provides guidance to your family and loved ones and reduces their decisional burden about whether or not they are making the right decisions based on your wishes.  Follow the link provided in your after visit summary or read over the paperwork we have mailed to you to help you started getting your Advance Directives in place. If you need assistance in completing these, please reach out to us  so that we can help you!  See attachments for Exercise.

## 2024-01-12 NOTE — Assessment & Plan Note (Signed)
 Well controlled, no changes to meds. Encouraged heart healthy diet such as the DASH diet and exercise as tolerated.

## 2024-01-12 NOTE — Assessment & Plan Note (Signed)
 Tolerating statin, encouraged heart healthy diet, avoid trans fats, minimize simple carbs and saturated fats. Increase exercise as tolerated

## 2024-01-12 NOTE — Assessment & Plan Note (Signed)
 hgba1c acceptable, minimize simple carbs. Increase exercise as tolerated. Continue current meds.

## 2024-01-12 NOTE — Assessment & Plan Note (Signed)
 He has  low dose CT scan annually, smokes a PPD still believes he wants to quit but still does not feel ready .

## 2024-01-12 NOTE — Assessment & Plan Note (Signed)
 S/p TURP in 3/25 bx results benign

## 2024-01-14 ENCOUNTER — Encounter: Payer: Self-pay | Admitting: Family Medicine

## 2024-01-14 ENCOUNTER — Telehealth (INDEPENDENT_AMBULATORY_CARE_PROVIDER_SITE_OTHER): Admitting: Family Medicine

## 2024-01-14 DIAGNOSIS — I714 Abdominal aortic aneurysm, without rupture, unspecified: Secondary | ICD-10-CM

## 2024-01-14 DIAGNOSIS — E1165 Type 2 diabetes mellitus with hyperglycemia: Secondary | ICD-10-CM | POA: Diagnosis not present

## 2024-01-14 DIAGNOSIS — Z7984 Long term (current) use of oral hypoglycemic drugs: Secondary | ICD-10-CM

## 2024-01-14 DIAGNOSIS — I10 Essential (primary) hypertension: Secondary | ICD-10-CM

## 2024-01-14 DIAGNOSIS — E7800 Pure hypercholesterolemia, unspecified: Secondary | ICD-10-CM

## 2024-01-14 DIAGNOSIS — N400 Enlarged prostate without lower urinary tract symptoms: Secondary | ICD-10-CM | POA: Diagnosis not present

## 2024-01-14 DIAGNOSIS — F17200 Nicotine dependence, unspecified, uncomplicated: Secondary | ICD-10-CM

## 2024-01-14 NOTE — Assessment & Plan Note (Addendum)
 Being followed by his cardiologist. Last scan in May 2025

## 2024-01-14 NOTE — Progress Notes (Signed)
 MyChart Video Visit    Virtual Visit via Video Note   This patient is at least at moderate risk for complications without adequate follow up. This format is felt to be most appropriate for this patient at this time. Physical exam was limited by quality of the video and audio technology used for the visit. Porsha, CMA was able to get the patient set up on a video visit.  Patient location: home Patient and provider in visit Provider location: Office  I discussed the limitations of evaluation and management by telemedicine and the availability of in person appointments. The patient expressed understanding and agreed to proceed.  Visit Date: 01/14/2024  Today's healthcare provider: Harlene Horton, MD     Subjective:    Patient ID: Nathan Hess, male    DOB: [birth year], 73 y.o.   MRN: [id number]  Chief Complaint  Patient presents with   Medical Management of Chronic Issues    Patient presents today for a year follow-up.    HPI Discussed the use of AI scribe software for clinical note transcription with the patient, who gave verbal consent to proceed.  History of Present Illness Nathan Hess is a 73 year old male with diabetes and hypertension who presents for follow-up of his blood sugar and blood pressure management.  His hemoglobin A1c has recently increased to 9% in March, up from 7%. He was previously taking metformin  once daily but has increased his dosage to four times a day, taking two in the morning, one at midday, and one in the evening. He has not been performing finger stick glucose monitoring but has been more diligent about reducing sugar intake, opting for no sugar iced tea, coffee with Splenda, and sugar-free syrup on pancakes.  He has a history of hypertension and is taking telmisartan  HCT daily. A recent blood pressure reading was 106/47 during a Medicare visit last Thursday. He has been paying more attention to hydration and has experienced a  significant improvement in urinary symptoms following a recent surgery.  He has a history of an aortic aneurysm, which was noted to have increased in size but subsequent imaging showed no change, remaining stable at 3.7 cm. He underwent a TURP procedure, which has improved his urinary symptoms significantly.  He is a smoker, currently smoking a pack per day, and wants to quit. He has attempted to quit multiple times in the past and is considering a structured approach to reduce smoking gradually.    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   Heartburn 10/02/2023   History of colonic polyps 06/26/2015   3rd colonoscopy in 2016 with Dr Marya in [place]   Hyperlipidemia 05/25/2012   Hypogonadism male 09/24/2011   Insomnia 09/27/2020   Left knee DJD    Lumbago 04/25/2014   Migraine headache 08/03/2012   Muscle weakness 05/14/2023   Obesity 04/11/2007   Pneumonia    Polyp of colon 02/10/2020   Restless sleeper 09/28/2020   Right knee pain 06/27/2021   Right-sided chest pain 10/02/2023   RUQ pain 10/02/2023   Salivary gland stone 02/28/2016   Seborrheic keratoses 04/03/2018   Tobacco  use disorder 10/30/2012   Type 2 diabetes mellitus (HCC) 04/10/2010    Past Surgical History:  Procedure Laterality Date   FINGER FRACTURE SURGERY Left    left hand surgery     PARTIAL KNEE ARTHROPLASTY Left 04/06/2013   Procedure: UNICOMPARTMENTAL LEFT KNEE;  Surgeon: Lamar DELENA Millman, MD;  Location: [facility];  Service: Orthopedics;  Laterality: Left;   TRANSURETHRAL RESECTION OF PROSTATE N/A 10/16/2023   Procedure: TURP (TRANSURETHRAL RESECTION  OF PROSTATE);  Surgeon: Alvaro Ricardo KATHEE Mickey., MD;  Location: [facility];  Service: Urology;  Laterality: N/A;   UMBILICAL HERNIA REPAIR  07/24/2015   w/ small ventral ex mesh    Family History  Problem Relation Age of Onset   Alzheimer's disease Mother    Hypertension Father    Diabetes Brother        ?   Alcohol abuse Maternal Grandfather    Cancer Paternal Grandmother    Heart disease Paternal Grandfather        MI    Social History   Socioeconomic History   Marital status: Married    Spouse name: Not on file   Number of children: Not on file   Years of education: Not on file   Highest education level: Not on file  Occupational History   Not on file  Tobacco Use   Smoking status: Every Day    Current packs/day: 1.00    Average packs/day: 1 pack/day for 38.5 years (38.5 ttl pk-yrs)    Types: Cigarettes    Start date: 33   Smokeless tobacco: Never  Vaping Use   Vaping status: Some Days  Substance and Sexual Activity   Alcohol use: Yes    Comment: 1 beer about every 2 months    Drug use: No   Sexual activity: Yes    Birth control/protection: Condom  Other Topics Concern   Not on file  Social History Narrative   Not on file   Social Drivers of Health   Financial Resource Strain: Low Risk  (01/09/2024)   Overall Financial Resource Strain (CARDIA)    Difficulty of Paying Living Expenses: Not hard at all  Food Insecurity: No Food Insecurity (01/09/2024)   Hunger Vital Sign    Worried About Running Out of Food in the Last Year: Never true    Ran Out of Food in the Last Year: Never true  Transportation Needs: No Transportation Needs (01/09/2024)   PRAPARE - Administrator, Civil Service (Medical): No    Lack of Transportation (Non-Medical): No  Physical Activity: Inactive (01/09/2024)   Exercise Vital Sign    Days of Exercise per Week: 0 days    Minutes of Exercise per Session: 0 min  Stress: No Stress Concern Present (01/09/2024)   [employer] of  Occupational Health - Occupational Stress Questionnaire    Feeling of Stress: Not at all  Social Connections: Moderately Integrated (01/09/2024)   Social Connection and Isolation Panel    Frequency of Communication with Friends and Family: More than three times a week    Frequency of Social Gatherings with Friends and Family: More than three times a week    Attends Religious Services: Never    Database administrator or Organizations: Yes    Attends Banker Meetings: 1 to 4 times per year    Marital Status: Married  Catering manager Violence: Not At Risk (01/09/2024)   Humiliation, Afraid, Rape, and Kick questionnaire    Fear of Current  or Ex-Partner: No    Emotionally Abused: No    Physically Abused: No    Sexually Abused: No    Outpatient Medications Prior to Visit  Medication Sig Dispense Refill   acetaminophen  (TYLENOL ) 500 MG tablet Take 1,000 mg by mouth every 6 (six) hours as needed for moderate pain (pain score 4-6).     aspirin  EC 81 MG tablet Take 1 tablet (81 mg total) by mouth daily. Swallow whole. 90 tablet 3   finasteride  (PROSCAR ) 5 MG tablet Take 5 mg by mouth daily.     metFORMIN  (GLUCOPHAGE ) 500 MG tablet Take 1 tablet (500 mg total) by mouth 4 (four) times daily. Take 4 tabs a day , take 2 in am, one in afternoon and 1 in pm.     rosuvastatin  (CRESTOR ) 20 MG tablet TAKE 1 TABLET BY MOUTH EVERY DAY 90 tablet 1   telmisartan -hydrochlorothiazide  (MICARDIS  HCT) 40-12.5 MG tablet Take 1 tablet by mouth daily. 90 tablet 1   triamcinolone  (KENALOG ) 0.1 % Apply 1 application topically as needed. (Patient taking differently: Apply 1 application  topically as needed (rash).) 80 g 5   busPIRone  (BUSPAR ) 5 MG tablet TAKE 1 TABLET BY MOUTH THREE TIMES A DAY (Patient not taking: Reported on 01/14/2024) 270 tablet 1   No facility-administered medications prior to visit.    No Known Allergies  Review of Systems  Constitutional:  Negative for fever and malaise/fatigue.   HENT:  Negative for congestion.   Eyes:  Negative for blurred vision.  Respiratory:  Negative for shortness of breath.   Cardiovascular:  Negative for chest pain, palpitations and leg swelling.  Gastrointestinal:  Negative for abdominal pain, blood in stool and nausea.  Genitourinary:  Negative for dysuria and frequency.  Musculoskeletal:  Negative for falls.  Skin:  Negative for rash.  Neurological:  Negative for dizziness, loss of consciousness and headaches.  Endo/Heme/Allergies:  Negative for environmental allergies.  Psychiatric/Behavioral:  Negative for depression. The patient is not nervous/anxious.        Objective:    Physical Exam Constitutional:      General: He is not in acute distress.    Appearance: Normal appearance. He is not ill-appearing or toxic-appearing.  HENT:     Head: Normocephalic and atraumatic.     Right Ear: External ear normal.     Left Ear: External ear normal.     Nose: Nose normal.   Eyes:     General:        Right eye: No discharge.        Left eye: No discharge.   Pulmonary:     Effort: Pulmonary effort is normal.   Skin:    Findings: No rash.   Neurological:     Mental Status: He is alert and oriented to person, place, and time.   Psychiatric:        Behavior: Behavior normal.     There were no vitals taken for this visit. Wt Readings from Last 3 Encounters:  01/09/24 219 lb (99.3 kg)  10/16/23 221 lb 1.6 oz (100.3 kg)  10/15/23 221 lb (100.2 kg)       Assessment & Plan:  Hypertension, unspecified type Assessment & Plan: Well controlled, no changes to meds. Encouraged heart healthy diet such as the DASH diet and exercise as tolerated.    Hypercholesterolemia Assessment & Plan: Tolerating statin, encouraged heart healthy diet, avoid trans fats, minimize simple carbs and saturated fats. Increase exercise as tolerated   Enlarged prostate  Assessment & Plan: S/p TURP in 3/25 bx results benign   Type 2 diabetes  mellitus with hyperglycemia, unspecified whether long term insulin  use [facility]) Assessment & Plan: Patient reports an A1C in March was 9 up from 7 and he has only been taking 1 metformin  daily. Now you have increased back up to four daily, ie 2 in am and then 1 in midday and 1 in evening, minimize simple carbs. Increase exercise as tolerated. Continue current meds.   Tobacco use disorder Assessment & Plan: He has  low dose CT scan annually, smokes a PPD still believes he wants to quit and he now feels ready. Tried Wellbutrin in past he did not like it. Has also tried patches start with 14 first. He is gonna try dropping them on his own first.    Abdominal aortic aneurysm (AAA) without rupture, unspecified part [facility]) Assessment & Plan: Being followed by his cardiologist. Last scan in May 2025      Assessment and Plan Assessment & Plan Type 2 Diabetes Mellitus with Hyperglycemia A1c increased from 7 to 9 in March, indicating poor glycemic control due to non-compliance with metformin . Increased metformin  to four doses daily and reduced sugar intake. - Order lab work to recheck A1c. - Consider adding Farxiga or Jardiance depending on lab results.  Hypertension Blood pressure at 106/47, lower than usual. On telmisartan -hydrochlorothiazide  for years with previously well-managed blood pressure. Recent urinary retention post-surgery may have affected blood pressure. Discussed potential dehydration and medication adjustment if blood pressure remains low. - Monitor blood pressure at home twice a week. - Purchase an Omron automated upper arm blood pressure cuff. - Consider separating telmisartan  and hydrochlorothiazide  if blood pressure remains low. - Ensure adequate hydration.  Abdominal Aortic Aneurysm (AAA) Previous imaging showed an increase in size, but ultrasound in May showed no change, holding steady at 3.7 cm. Discrepancies may be due to different imaging modalities and readers. - Repeat  imaging in a few months.  Tobacco Use Disorder Expresses readiness to quit smoking after multiple failed attempts. Discussed cessation strategies including nicotine patches, gradual reduction, and potential use of Chantix . Explained that each successive quit attempt increases likelihood of success and reduces health risks. - Implement a disciplined, charted reduction in cigarette use. - Consider nicotine patches or Chantix  if needed. - Involve spouse in cessation plan for support.     I discussed the assessment and treatment plan with the patient. The patient was provided an opportunity to ask questions and all were answered. The patient agreed with the plan and demonstrated an understanding of the instructions.   The patient was advised to call back or seek an in-person evaluation if the symptoms worsen or if the condition fails to improve as anticipated.  Harlene Horton, MD [facility] at [facility] [phone] (phone) [phone] (fax)  [facility]

## 2024-01-20 ENCOUNTER — Other Ambulatory Visit: Payer: Self-pay

## 2024-01-20 DIAGNOSIS — F17210 Nicotine dependence, cigarettes, uncomplicated: Secondary | ICD-10-CM

## 2024-01-20 DIAGNOSIS — Z87891 Personal history of nicotine dependence: Secondary | ICD-10-CM

## 2024-01-20 DIAGNOSIS — Z122 Encounter for screening for malignant neoplasm of respiratory organs: Secondary | ICD-10-CM

## 2024-02-07 ENCOUNTER — Other Ambulatory Visit: Payer: Self-pay | Admitting: Cardiology

## 2024-02-15 ENCOUNTER — Other Ambulatory Visit: Payer: Self-pay | Admitting: Family Medicine

## 2024-02-27 DIAGNOSIS — M48062 Spinal stenosis, lumbar region with neurogenic claudication: Secondary | ICD-10-CM | POA: Diagnosis not present

## 2024-03-08 ENCOUNTER — Other Ambulatory Visit: Payer: Self-pay | Admitting: Family Medicine

## 2024-03-08 DIAGNOSIS — E785 Hyperlipidemia, unspecified: Secondary | ICD-10-CM

## 2024-03-08 DIAGNOSIS — I10 Essential (primary) hypertension: Secondary | ICD-10-CM

## 2024-03-08 DIAGNOSIS — F32A Depression, unspecified: Secondary | ICD-10-CM

## 2024-03-08 DIAGNOSIS — E1169 Type 2 diabetes mellitus with other specified complication: Secondary | ICD-10-CM

## 2024-03-08 DIAGNOSIS — F17200 Nicotine dependence, unspecified, uncomplicated: Secondary | ICD-10-CM

## 2024-03-08 DIAGNOSIS — M545 Low back pain, unspecified: Secondary | ICD-10-CM

## 2024-03-17 DIAGNOSIS — M48062 Spinal stenosis, lumbar region with neurogenic claudication: Secondary | ICD-10-CM | POA: Diagnosis not present

## 2024-03-18 ENCOUNTER — Telehealth: Payer: Self-pay

## 2024-03-18 NOTE — Telephone Encounter (Signed)
 Copied from CRM [id number]. Topic: General - Other >> Mar 18, 2024  9:04 AM Rosina BIRCH wrote: Reason for CRM: patient called stating he is having back surgery on sept 15 and the surgeon would like to see a stress test done before he has surgery just to be safe. Patient also stated that [facility] urology stated the patient graduated from urologist so there is no need to see him again. The patient is still taking finasteride  (PROSCAR ) 5 MG tablet and he want to see if the provider can start prescribing that to him CB [phone]

## 2024-03-19 ENCOUNTER — Other Ambulatory Visit: Payer: Self-pay | Admitting: Family Medicine

## 2024-03-19 DIAGNOSIS — Z01810 Encounter for preprocedural cardiovascular examination: Secondary | ICD-10-CM

## 2024-03-19 DIAGNOSIS — E7800 Pure hypercholesterolemia, unspecified: Secondary | ICD-10-CM

## 2024-03-19 DIAGNOSIS — F17200 Nicotine dependence, unspecified, uncomplicated: Secondary | ICD-10-CM

## 2024-03-19 DIAGNOSIS — I714 Abdominal aortic aneurysm, without rupture, unspecified: Secondary | ICD-10-CM

## 2024-03-19 DIAGNOSIS — E1165 Type 2 diabetes mellitus with hyperglycemia: Secondary | ICD-10-CM

## 2024-03-19 DIAGNOSIS — I10 Essential (primary) hypertension: Secondary | ICD-10-CM

## 2024-03-19 MED ORDER — FINASTERIDE 5 MG PO TABS: 5.0000 mg | ORAL_TABLET | Freq: Every day | ORAL | 1 refills | Status: AC

## 2024-03-19 NOTE — Progress Notes (Deleted)
 Subjective:     Patient ID: Nathan Hess, male    DOB: 1951-05-02, 73 y.o.   MRN: [id number]  No chief complaint on file.   HPI  Discussed the use of AI scribe software for clinical note transcription with the patient, who gave verbal consent to proceed.  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   Heartburn 10/02/2023   History of colonic polyps 06/26/2015   3rd colonoscopy in 2016 with Dr Marya in [place]   Hyperlipidemia 05/25/2012   Hypogonadism male 09/24/2011   Insomnia 09/27/2020   Left knee DJD    Lumbago 04/25/2014   Migraine headache 08/03/2012   Muscle weakness 05/14/2023   Obesity 04/11/2007   Pneumonia    Polyp of colon 02/10/2020   Restless sleeper 09/28/2020   Right knee pain 06/27/2021   Right-sided chest pain 10/02/2023   RUQ pain 10/02/2023   Salivary gland stone 02/28/2016   Seborrheic keratoses 04/03/2018   Tobacco use disorder 10/30/2012   Type 2 diabetes mellitus (HCC) 04/10/2010    Past Surgical History:  Procedure Laterality Date   FINGER FRACTURE SURGERY Left    left hand surgery     PARTIAL KNEE ARTHROPLASTY Left 04/06/2013   Procedure: UNICOMPARTMENTAL LEFT KNEE;  Surgeon: Lamar DELENA Millman, MD;  Location: [facility] OR;  Service: Orthopedics;  Laterality: Left;   TRANSURETHRAL RESECTION OF PROSTATE N/A 10/16/2023   Procedure: TURP (TRANSURETHRAL RESECTION OF PROSTATE);  Surgeon: Alvaro Ricardo KATHEE Mickey., MD;  Location: [facility];  Service: Urology;  Laterality: N/A;    UMBILICAL HERNIA REPAIR  07/24/2015   w/ small ventral ex mesh    Family History  Problem Relation Age of Onset   Alzheimer's disease Mother    Hypertension Father    Diabetes Brother        ?   Alcohol abuse Maternal Grandfather    Cancer Paternal Grandmother    Heart disease Paternal Grandfather        MI    Social History   Socioeconomic History   Marital status: Married    Spouse name: Not on file   Number of children: Not on file   Years of education: Not on file   Highest education level: Not on file  Occupational History   Not on file  Tobacco Use   Smoking status: Every Day    Current packs/day: 1.00    Average packs/day: 1 pack/day for 38.7 years (38.7 ttl pk-yrs)    Types: Cigarettes    Start date: 31   Smokeless tobacco: Never  Vaping Use   Vaping status: Some Days  Substance and Sexual Activity   Alcohol use: Yes    Comment: 1 beer about every 2 months    Drug use: No   Sexual activity: Yes    Birth control/protection: Condom  Other Topics Concern   Not on file  Social History Narrative   Not on file   Social Drivers of  Health   Financial Resource Strain: Low Risk  (01/09/2024)   Overall Financial Resource Strain (CARDIA)    Difficulty of Paying Living Expenses: Not hard at all  Food Insecurity: No Food Insecurity (01/09/2024)   Hunger Vital Sign    Worried About Running Out of Food in the Last Year: Never true    Ran Out of Food in the Last Year: Never true  Transportation Needs: No Transportation Needs (01/09/2024)   PRAPARE - Administrator, Civil Service (Medical): No    Lack of Transportation (Non-Medical): No  Physical Activity: Inactive (01/09/2024)   Exercise Vital Sign    Days of Exercise per Week: 0 days    Minutes of Exercise per Session: 0 min  Stress: No Stress Concern Present (01/09/2024)   [employer] of Occupational Health - Occupational Stress Questionnaire    Feeling of Stress: Not at all  Social  Connections: Moderately Integrated (01/09/2024)   Social Connection and Isolation Panel    Frequency of Communication with Friends and Family: More than three times a week    Frequency of Social Gatherings with Friends and Family: More than three times a week    Attends Religious Services: Never    Database administrator or Organizations: Yes    Attends Banker Meetings: 1 to 4 times per year    Marital Status: Married  Catering manager Violence: Not At Risk (01/09/2024)   Humiliation, Afraid, Rape, and Kick questionnaire    Fear of Current or Ex-Partner: No    Emotionally Abused: No    Physically Abused: No    Sexually Abused: No    Outpatient Medications Prior to Visit  Medication Sig Dispense Refill   acetaminophen  (TYLENOL ) 500 MG tablet Take 1,000 mg by mouth every 6 (six) hours as needed for moderate pain (pain score 4-6).     aspirin  EC 81 MG tablet Take 1 tablet (81 mg total) by mouth daily. Swallow whole. 90 tablet 3   busPIRone  (BUSPAR ) 5 MG tablet TAKE 1 TABLET BY MOUTH THREE TIMES A DAY (Patient not taking: Reported on 01/14/2024) 270 tablet 1   finasteride  (PROSCAR ) 5 MG tablet Take 1 tablet (5 mg total) by mouth daily. 90 tablet 1   metFORMIN  (GLUCOPHAGE ) 500 MG tablet Take 4 tabs a day , take 2 in am, one in afternoon and 1 in pm. 120 tablet 5   rosuvastatin  (CRESTOR ) 20 MG tablet Take 1 tablet (20 mg total) by mouth daily. Need lab work 90 tablet 0   telmisartan -hydrochlorothiazide  (MICARDIS  HCT) 40-12.5 MG tablet TAKE 1 TABLET BY MOUTH EVERY DAY 90 tablet 3   triamcinolone  (KENALOG ) 0.1 % Apply 1 application topically as needed. (Patient taking differently: Apply 1 application  topically as needed (rash).) 80 g 5   No facility-administered medications prior to visit.    No Known Allergies  ROS     Objective:    Physical Exam   There were no vitals taken for this visit. Wt Readings from Last 3 Encounters:  01/09/24 219 lb (99.3 kg)  10/16/23 221 lb  1.6 oz (100.3 kg)  10/15/23 221 lb (100.2 kg)       Assessment & Plan:   Problem List Items Addressed This Visit   None   I am having Ryszard C. Couvillon maintain his triamcinolone  cream, busPIRone , aspirin  EC, acetaminophen , telmisartan -hydrochlorothiazide , metFORMIN , rosuvastatin , and finasteride .  No orders of the defined types were placed in this encounter.

## 2024-03-19 NOTE — Telephone Encounter (Signed)
 Patient notified

## 2024-03-20 ENCOUNTER — Ambulatory Visit: Admitting: Student

## 2024-03-24 ENCOUNTER — Telehealth: Payer: Self-pay

## 2024-03-24 ENCOUNTER — Telehealth: Payer: Self-pay | Admitting: Cardiology

## 2024-03-24 NOTE — Telephone Encounter (Signed)
 Can be discussed at appt on 03/27/24   Copied from CRM #[id number]. Topic: General - Other >> Mar 24, 2024 11:59 AM [place] A wrote: Reason for CRM: Patient called because he was informed that he needed to call his Primary office to have Dr.Blyth send the Cardiologist a request so that Dr. Leandrew office for a Nuclear Stress Test.

## 2024-03-24 NOTE — Telephone Encounter (Signed)
 Pt called to schedule nuclear stress test but PCP did not put a request in. I let him know to call PCP to put in a request and we can schedule him. Stress test is for herniated disc surgery in 2 weeks. FYI

## 2024-03-25 NOTE — Progress Notes (Addendum)
 Subjective:      HPI: Pt is a 73 y.o. male who is here for preoperative clearance for Sublaminar decompression- L4-L5, left sided approach for bilateral decompression on September 15th, 2025  1) High Risk Cardiac Conditions:  1) Recent MI - No.  2) Decompensated Heart Failure - No.  3) Unstable angina - No.  4) Symptomatic arrythmia - No.  5) Sx Valvular Disease - No.  2) Intermediate Risk Factors: DM, CKD, CVA, CHF, CAD - Yes.    2) Functional Status: > 4 mets (Walk, run, climb stairs) Yes.  SABRA Hails Activity Status Index: 8.97 METS/ 50.7  3) Surgery Specific Risk:   Intermediate          4) Further Noninvasive evaluation:   1) EKG - Yes  2) Echo - No.  3) Stress Testing - TBD by Cardiology.  4) CXR Yes.   Pt is a smoker  5)PFTs No.    5) Need for medical therapy - Beta Blocker, Statins indicated ? Yes.     Social history:  Relevant past medical, surgical, family and social history reviewed and updated as indicated. Interim medical history since our last visit reviewed.  Allergies and medications reviewed and updated.  DATA REVIEWED: CHART IN EPIC  ROS: Negative unless specifically indicated above in HPI.    Current Outpatient Medications:    acetaminophen  (TYLENOL ) 500 MG tablet, Take 1,000 mg by mouth every 6 (six) hours as needed for moderate pain (pain score 4-6)., Disp: , Rfl:    aspirin  EC 81 MG tablet, Take 1 tablet (81 mg total) by mouth daily. Swallow whole., Disp: 90 tablet, Rfl: 3   finasteride  (PROSCAR ) 5 MG tablet, Take 1 tablet (5 mg total) by mouth daily., Disp: 90 tablet, Rfl: 1   metFORMIN  (GLUCOPHAGE ) 500 MG tablet, Take 4 tabs a day , take 2 in am, one in afternoon and 1 in pm., Disp: 120 tablet, Rfl: 5   rosuvastatin  (CRESTOR ) 20 MG tablet, Take 1 tablet (20 mg total) by mouth daily. Need lab work, Disp: 90 tablet, Rfl: 0   telmisartan -hydrochlorothiazide  (MICARDIS  HCT) 40-12.5 MG tablet, TAKE 1 TABLET BY MOUTH EVERY DAY, Disp: 90 tablet,  Rfl: 3   triamcinolone  (KENALOG ) 0.1 %, Apply 1 application topically as needed. (Patient taking differently: Apply 1 application  topically as needed (rash).), Disp: 80 g, Rfl: 5      Objective:    BP 118/62 (BP Location: Right Arm, Patient Position: Sitting, Cuff Size: Normal)   Pulse 70   Temp 97.9 F (36.6 C) (Oral)   Resp 12   Ht 5' 11 (1.803 m)   Wt 215 lb 12.8 oz (97.9 kg)   SpO2 100%   BMI 30.10 kg/m   Wt Readings from Last 3 Encounters:  03/27/24 215 lb 12.8 oz (97.9 kg)  01/09/24 219 lb (99.3 kg)  10/16/23 221 lb 1.6 oz (100.3 kg)    Physical Exam  General: No acute distress. Awake and conversant.  Eyes: Normal conjunctiva, anicteric. Round symmetric pupils.  Respiratory: CTAB. Respirations are non-labored. No wheezing.  Skin: Warm. No rashes or ulcers.  Psych: Alert and oriented. Cooperative, Appropriate mood and affect, Normal judgment.  CV: RRR. No murmur. No lower extremity edema.  MSK: Gait abnormality noted related to lumbar pain; however, patient ambulates independently. No evidence of clubbing or cyanosis observed. Neuro:  CN II-XII grossly normal.    EKG interpretation: Rate: 63 Rhythm: NSR No ST/T changes concerning for acute ischemia/infarct  EKG performed to for preoperative clearance . I have personally seen and reviewed the EKG. No acute concerns. Harlene LITTIE Jolly, NP  03/27/24    .      Assessment & Plan:  Encounter for pre-operative examination -     MYOCARDIAL PERFUSION IMAGING; Future -     CBC with Differential/Platelet -     Comprehensive metabolic panel with GFR -     Protime-INR; Future -     Urinalysis -     EKG 12-Lead -     DG Chest 2 View; Future -     Urine Culture    I have independently evaluated patient.  KENNA KIRN is a 73 y.o. male who is intermediate risk for a moderate risk surgery.  Patient is cleared medically but needs separate cardiology clearance.  Pt voices understanding of this.  There are  modifiable risk factors- current smoker Braxton Vantrease Dampier's RCRI score- 0  NO ACE-I/ARB day of surgery. OK to do BB or statin day of   Hold aspirin  5 days before Sx    No follow-ups on file.  Zakaria Fromer L. Tandy Lewin, DNP, AGNP-C

## 2024-03-27 ENCOUNTER — Ambulatory Visit: Payer: Self-pay | Admitting: Student

## 2024-03-27 ENCOUNTER — Ambulatory Visit: Admitting: Student

## 2024-03-27 ENCOUNTER — Telehealth (HOSPITAL_COMMUNITY): Payer: Self-pay | Admitting: *Deleted

## 2024-03-27 ENCOUNTER — Ambulatory Visit (HOSPITAL_BASED_OUTPATIENT_CLINIC_OR_DEPARTMENT_OTHER)
Admission: RE | Admit: 2024-03-27 | Discharge: 2024-03-27 | Disposition: A | Discharge status: Home / Self Care | Source: Ambulatory Visit | Attending: Student | Admitting: Student

## 2024-03-27 ENCOUNTER — Telehealth (HOSPITAL_COMMUNITY): Payer: Self-pay | Admitting: Radiology

## 2024-03-27 ENCOUNTER — Other Ambulatory Visit: Payer: Self-pay | Admitting: Student

## 2024-03-27 ENCOUNTER — Encounter: Payer: Self-pay | Admitting: Student

## 2024-03-27 ENCOUNTER — Other Ambulatory Visit

## 2024-03-27 VITALS — BP 118/62 | HR 70 | Temp 97.9°F | Resp 12 | Ht 71.0 in | Wt 215.8 lb

## 2024-03-27 DIAGNOSIS — Z01818 Encounter for other preprocedural examination: Secondary | ICD-10-CM | POA: Insufficient documentation

## 2024-03-27 LAB — COMPREHENSIVE METABOLIC PANEL WITH GFR
ALT: 26 U/L (ref 0–53)
AST: 16 U/L (ref 0–37)
Albumin: 4.5 g/dL (ref 3.5–5.2)
Alkaline Phosphatase: 59 U/L (ref 39–117)
BUN: 17 mg/dL (ref 6–23)
CO2: 29 meq/L (ref 19–32)
Calcium: 9.2 mg/dL (ref 8.4–10.5)
Chloride: 102 meq/L (ref 96–112)
Creatinine, Ser: 0.73 mg/dL (ref 0.40–1.50)
GFR: 90.24 mL/min (ref >60.00)
Glucose, Bld: 130 mg/dL — ABNORMAL HIGH (ref 70–99)
Potassium: 4.2 meq/L (ref 3.5–5.1)
Sodium: 139 meq/L (ref 135–145)
Total Bilirubin: 0.4 mg/dL (ref 0.2–1.2)
Total Protein: 7 g/dL (ref 6.0–8.3)

## 2024-03-27 LAB — CBC WITH DIFFERENTIAL/PLATELET
Basophils Absolute: 0.1 K/uL (ref 0.0–0.1)
Basophils Relative: 0.8 % (ref 0.0–3.0)
Eosinophils Absolute: 0.2 K/uL (ref 0.0–0.7)
Eosinophils Relative: 2.8 % (ref 0.0–5.0)
HCT: 39 % (ref 39.0–52.0)
Hemoglobin: 13.5 g/dL (ref 13.0–17.0)
Lymphocytes Relative: 26 % (ref 12.0–46.0)
Lymphs Abs: 2 K/uL (ref 0.7–4.0)
MCHC: 34.8 g/dL (ref 30.0–36.0)
MCV: 94.5 fl (ref 78.0–100.0)
Monocytes Absolute: 0.7 K/uL (ref 0.1–1.0)
Monocytes Relative: 9.4 % (ref 3.0–12.0)
Neutro Abs: 4.7 K/uL (ref 1.4–7.7)
Neutrophils Relative %: 61 % (ref 43.0–77.0)
Platelets: 216 K/uL (ref 150.0–400.0)
RBC: 4.12 Mil/uL — ABNORMAL LOW (ref 4.22–5.81)
RDW: 13.1 % (ref 11.5–15.5)
WBC: 7.7 K/uL (ref 4.0–10.5)

## 2024-03-27 LAB — URINALYSIS
Bilirubin Urine: NEGATIVE
Hgb urine dipstick: NEGATIVE
Ketones, ur: NEGATIVE
Leukocytes,Ua: NEGATIVE
Nitrite: NEGATIVE
Specific Gravity, Urine: 1.015 (ref 1.000–1.030)
Total Protein, Urine: NEGATIVE
Urine Glucose: 500 — AB
Urobilinogen, UA: 0.2 (ref 0.0–1.0)
pH: 6 (ref 5.0–8.0)

## 2024-03-27 NOTE — Patient Instructions (Addendum)
 NO ACE-I/ARB day of surgery- telmisartan -hydrochlorothiazide  (MICARDIS  HCT) 40-12.5 MG tablet  Resume as prescribed next day after Sx procedure Hold aspirin  5 days before Sx

## 2024-03-27 NOTE — Telephone Encounter (Signed)
 Patient given detailed instructions per Myocardial Perfusion Study Information Sheet for the test on 03/31/2024 at 12:30. Patient notified to arrive 15 minutes early and that it is imperative to arrive on time for appointment to keep from having the test rescheduled.  If you need to cancel or reschedule your appointment, please call the office within 24 hours of your appointment. . Patient verbalized understanding.Nathan Hess

## 2024-03-27 NOTE — Telephone Encounter (Signed)
 Patient given detailed instructions per Myocardial Perfusion Study Information Sheet for the test on 9/9 at 12:30. Patient notified to arrive 15 minutes early and that it is imperative to arrive on time for appointment to keep from having the test rescheduled.  If you need to cancel or reschedule your appointment, please call the office within 24 hours of your appointment. . Patient verbalized understanding.EK

## 2024-03-28 LAB — URINE CULTURE
MICRO NUMBER:: 16928863
Result:: NO GROWTH
SPECIMEN QUALITY:: ADEQUATE

## 2024-03-30 ENCOUNTER — Other Ambulatory Visit: Payer: Self-pay | Admitting: Student

## 2024-03-30 ENCOUNTER — Ambulatory Visit: Payer: Self-pay | Admitting: Student

## 2024-03-30 ENCOUNTER — Telehealth: Payer: Self-pay

## 2024-03-30 DIAGNOSIS — Z01818 Encounter for other preprocedural examination: Secondary | ICD-10-CM

## 2024-03-30 NOTE — Telephone Encounter (Signed)
   Pre-operative Risk Assessment    Patient Name: Nathan Hess  DOB: [birth year] MRN: [id number]   Date of last office visit: 10/03/23 REDELL LEITER, MD Date of next office visit: NONE   Request for Surgical Clearance    Procedure:  SUBLAMINAR DECOMPRESSION L4-L5 LEFT SIDE APPROACH FOR BILATERAL DECOMPRESSION  Date of Surgery:  Clearance TBD                                Surgeon:  FORBES KATRINKA SCHOOLING, MD Surgeon's Group or Practice Name:  [facility] Phone number:  [phone] Fax number:  [phone]   Type of Clearance Requested:   - Medical  - Pharmacy:  Hold Aspirin      Type of Anesthesia:  Not Indicated   Additional requests/questions:    SignedLucie DELENA Ku   03/30/2024, 5:37 PM

## 2024-03-31 ENCOUNTER — Ambulatory Visit (HOSPITAL_COMMUNITY)
Admission: RE | Admit: 2024-03-31 | Discharge: 2024-03-31 | Disposition: A | Discharge status: Home / Self Care | Source: Ambulatory Visit | Attending: Internal Medicine | Admitting: Internal Medicine

## 2024-03-31 DIAGNOSIS — Z01818 Encounter for other preprocedural examination: Secondary | ICD-10-CM | POA: Diagnosis not present

## 2024-03-31 DIAGNOSIS — Z01810 Encounter for preprocedural cardiovascular examination: Secondary | ICD-10-CM | POA: Diagnosis not present

## 2024-03-31 LAB — MYOCARDIAL PERFUSION IMAGING
LV dias vol: 91 mL (ref 62–150)
LV sys vol: 24 mL (ref 4.2–5.8)
Nuc Stress EF: 74 %
Peak HR: 93 {beats}/min
Rest HR: 63 {beats}/min
Rest Nuclear Isotope Dose: 10.8 mCi
SDS: 0
SRS: 4
SSS: 0
ST Depression (mm): 0 mm
Stress Nuclear Isotope Dose: 32.5 mCi
TID: 1.09

## 2024-03-31 MED ORDER — REGADENOSON 0.4 MG/5ML IV SOLN
INTRAVENOUS | Status: AC
  Filled 2024-03-31: qty 5

## 2024-03-31 MED ORDER — TECHNETIUM TC 99M TETROFOSMIN IV KIT
32.5000 | PACK | Freq: Once | INTRAVENOUS | Status: AC | PRN
  Administered 2024-03-31: 32.5 via INTRAVENOUS

## 2024-03-31 MED ORDER — TECHNETIUM TC 99M TETROFOSMIN IV KIT
10.8000 | PACK | Freq: Once | INTRAVENOUS | Status: AC | PRN
Start: 2024-03-31 — End: 2024-03-31
  Administered 2024-03-31: 10.8 via INTRAVENOUS

## 2024-03-31 MED ORDER — REGADENOSON 0.4 MG/5ML IV SOLN
0.4000 mg | Freq: Once | INTRAVENOUS | Status: AC
  Administered 2024-03-31: 0.4 mg via INTRAVENOUS

## 2024-03-31 NOTE — Telephone Encounter (Signed)
 1st attempt : Called patient, NA, left message to contact our office to schedule telehealth appointment for pre op clearance .

## 2024-03-31 NOTE — Telephone Encounter (Signed)
   Name: Nathan Hess  DOB: 1950/07/24  MRN: [id number]  Primary Cardiologist: None   Preoperative team, please contact this patient and set up a phone call appointment for further preoperative risk assessment. Please obtain consent and complete medication review. Thank you for your help.  I confirm that guidance regarding antiplatelet and oral anticoagulation therapy has been completed and, if necessary, noted below.  Regarding ASA therapy, we recommend continuation of ASA throughout the perioperative period. However, if the surgeon feels that cessation of ASA is required in the perioperative period, it may be stopped 5-7 days prior to surgery with a plan to resume it as soon as felt to be feasible from a surgical standpoint in the post-operative period.    I also confirmed the patient resides in the state of [place] . As per [facility] Medical Board telemedicine laws, the patient must reside in the state in which the provider is licensed.   Josefa CHRISTELLA Beauvais, NP 03/31/2024, 8:46 AM  [facility]

## 2024-04-01 ENCOUNTER — Encounter: Payer: Self-pay | Admitting: *Deleted

## 2024-04-01 ENCOUNTER — Encounter: Payer: Self-pay | Admitting: Student

## 2024-04-01 ENCOUNTER — Telehealth: Payer: Self-pay | Admitting: *Deleted

## 2024-04-01 NOTE — Telephone Encounter (Signed)
 Copied from CRM [id number]. Topic: Clinical - Request for Lab/Test Order >> Apr 01, 2024  3:01 PM Viola F wrote: Reason for CRM: Wanda from [facility] returned [facility] call, says they do need PT INR to be collected

## 2024-04-01 NOTE — Telephone Encounter (Signed)
 S/w the pt and med rec and consent are done. Pt has been scheduled tele preop appt 04/03/24 by Luke DASEN with our scheduling team.

## 2024-04-01 NOTE — Telephone Encounter (Addendum)
 PT INR was not collected on 03/27/24 and Harlene advised to call [facility] to see if they actually needed it before we call him back in.    Called and had to leave message for them to call us  back.

## 2024-04-01 NOTE — Telephone Encounter (Signed)
 Spoke with Nathan Hess again because I did not get previous message and she stated that pt may not need INR since he is not on any blood thinners.  Letter, labs, and ov notes faxed over.

## 2024-04-01 NOTE — Telephone Encounter (Signed)
 S/w the pt and med rec and consent are done. Pt has been scheduled tele preop appt 04/03/24 by Luke DASEN with our scheduling team.       Patient Consent for Virtual Visit        Nathan Hess has provided verbal consent on 04/01/2024 for a virtual visit (video or telephone).   CONSENT FOR VIRTUAL VISIT FOR:  Nathan Hess Phlegm  By participating in this virtual visit I agree to the following:  I hereby voluntarily request, consent and authorize [facility] and its employed or contracted physicians, physician assistants, nurse practitioners or other licensed health care professionals (the Practitioner), to provide me with telemedicine health care services (the "Services) as deemed necessary by the treating Practitioner. I acknowledge and consent to receive the Services by the Practitioner via telemedicine. I understand that the telemedicine visit will involve communicating with the Practitioner through live audiovisual communication technology and the disclosure of certain medical information by electronic transmission. I acknowledge that I have been given the opportunity to request an in-person assessment or other available alternative prior to the telemedicine visit and am voluntarily participating in the telemedicine visit.  I understand that I have the right to withhold or withdraw my consent to the use of telemedicine in the course of my care at any time, without affecting my right to future care or treatment, and that the Practitioner or I may terminate the telemedicine visit at any time. I understand that I have the right to inspect all information obtained and/or recorded in the course of the telemedicine visit and may receive copies of available information for a reasonable fee.  I understand that some of the potential risks of receiving the Services via telemedicine include:  Delay or interruption in medical evaluation due to technological equipment failure or  disruption; Information transmitted may not be sufficient (e.g. poor resolution of images) to allow for appropriate medical decision making by the Practitioner; and/or  In rare instances, security protocols could fail, causing a breach of personal health information.  Furthermore, I acknowledge that it is my responsibility to provide information about my medical history, conditions and care that is complete and accurate to the best of my ability. I acknowledge that Practitioner's advice, recommendations, and/or decision may be based on factors not within their control, such as incomplete or inaccurate data provided by me or distortions of diagnostic images or specimens that may result from electronic transmissions. I understand that the practice of medicine is not an exact science and that Practitioner makes no warranties or guarantees regarding treatment outcomes. I acknowledge that a copy of this consent can be made available to me via my patient portal [facility] MyChart), or I can request a printed copy by calling the office of  [facility].    I understand that my insurance will be billed for this visit.   I have read or had this consent read to me. I understand the contents of this consent, which adequately explains the benefits and risks of the Services being provided via telemedicine.  I have been provided ample opportunity to ask questions regarding this consent and the Services and have had my questions answered to my satisfaction. I give my informed consent for the services to be provided through the use of telemedicine in my medical care

## 2024-04-01 NOTE — Telephone Encounter (Deleted)
 Copied from CRM [id number]. Topic: Clinical - Request for Lab/Test Order >> Apr 01, 2024  3:01 PM Viola F wrote: Reason for CRM: Wanda from [facility] returned [facility] call, says they do need PT INR to be collected

## 2024-04-03 ENCOUNTER — Ambulatory Visit: Attending: Cardiology

## 2024-04-03 DIAGNOSIS — Z01810 Encounter for preprocedural cardiovascular examination: Secondary | ICD-10-CM | POA: Diagnosis not present

## 2024-04-03 NOTE — Progress Notes (Signed)
 Virtual Visit via Telephone Note   Because of [name] co-morbid illnesses, he is at least at moderate risk for complications without adequate follow up.  This format is felt to be most appropriate for this patient at this time.  Due to technical limitations with video connection (technology), today's appointment will be conducted as an audio only telehealth visit, and [name] verbally agreed to proceed in this manner.   All issues noted in this document were discussed and addressed.  No physical exam could be performed with this format.  Evaluation Performed:  Preoperative cardiovascular risk assessment _____________   Date:  04/03/2024   Patient ID:  Nathan Hess, DOB 1950/09/26, MRN [id number] Patient Location:  Home Provider location:   Office  Primary Care Provider:  Domenica Harlene LABOR, MD Primary Cardiologist:  None  Chief Complaint / Patient Profile   73 y.o. y/o male with a h/o AAA, HTN, CAD, HLD who is pending sublaminar decompression L4-L5 left side approach for  and presents today for telephonic preoperative cardiovascular risk assessment.  History of Present Illness    Nathan Hess is a 73 y.o. male who presents via audio/video conferencing for a telehealth visit today.  Pt was last seen in cardiology clinic on 10/03/2023 by Dr. Monetta.  At that time Nathan Hess was doing well.  The patient is now pending procedure as outlined above. Since his last visit, he has been doing well from a CV perspective, No chest pains or SOB. He has been having some symptoms related to his back like trouble putting socks on. He cannot stand for a long time and wash dishes.  However, he does meet 4 METS on the DASI.  Regarding ASA therapy, we recommend continuation of ASA throughout the perioperative period. However, if the surgeon feels that cessation of ASA is required in the perioperative period, it may be stopped 5-7 days prior to surgery with a  plan to resume it as soon as felt to be feasible from a surgical standpoint in the post-operative period.    Lexiscan  Myoview  03/31/24     The study is normal. The study is low risk.   No ST deviation was noted.   LV perfusion is normal. There is no evidence of ischemia. There is no evidence of infarction.   Left ventricular function is normal. Nuclear stress EF: 74%. The left ventricular ejection fraction is hyperdynamic (>65%). End diastolic cavity size is normal. End systolic cavity size is normal. No evidence of transient ischemic dilation (TID) noted.   CT images were obtained for attenuation correction and were examined for the presence of coronary calcium  when appropriate.   Coronary calcium  was present on the attenuation correction CT images. Minimal coronary calcifications were present. Coronary calcifications were present in the left anterior descending artery distribution(s).   Prior study not available for comparison.  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   Heartburn 10/02/2023   History of colonic polyps 06/26/2015   3rd colonoscopy in 2016 with Dr Marya in [place]  Hyperlipidemia 05/25/2012   Hypogonadism male 09/24/2011   Insomnia 09/27/2020   Left knee DJD    Lumbago 04/25/2014   Migraine headache 08/03/2012   Muscle weakness 05/14/2023   Obesity 04/11/2007   Pneumonia    Polyp of colon 02/10/2020   Restless sleeper 09/28/2020   Right knee pain 06/27/2021   Right-sided chest pain 10/02/2023   RUQ pain 10/02/2023   Salivary  gland stone 02/28/2016   Seborrheic keratoses 04/03/2018   Tobacco use disorder 10/30/2012   Type 2 diabetes mellitus (HCC) 04/10/2010   Past Surgical History:  Procedure Laterality Date   FINGER FRACTURE SURGERY Left    left hand surgery     PARTIAL KNEE ARTHROPLASTY Left 04/06/2013   Procedure: UNICOMPARTMENTAL LEFT KNEE;  Surgeon: Lamar DELENA Millman, MD;  Location: [facility] OR;  Service: Orthopedics;  Laterality: Left;   TRANSURETHRAL RESECTION OF PROSTATE N/A 10/16/2023   Procedure: TURP (TRANSURETHRAL RESECTION OF PROSTATE);  Surgeon: Alvaro Ricardo KATHEE Mickey., MD;  Location: [facility] ORS;  Service: Urology;  Laterality: N/A;   UMBILICAL HERNIA REPAIR  07/24/2015   w/ small ventral ex mesh    Allergies  No Known Allergies  Home Medications    Prior to Admission medications   Medication Sig Start Date End Date Taking? Authorizing Provider  acetaminophen  (TYLENOL ) 500 MG tablet Take 1,000 mg by mouth every 6 (six) hours as needed for moderate pain (pain score 4-6).    [provider]  aspirin  EC 81 MG tablet Take 1 tablet (81 mg total) by mouth daily. Swallow whole. 10/03/23   Monetta Redell PARAS, MD  finasteride  (PROSCAR ) 5 MG tablet Take 1 tablet (5 mg total) by mouth daily. 03/19/24   Domenica Harlene DELENA, MD  metFORMIN  (GLUCOPHAGE ) 500 MG tablet Take 4 tabs a day , take 2 in am, one in afternoon and 1 in pm. 02/17/24   Domenica Harlene DELENA, MD  rosuvastatin  (CRESTOR ) 20 MG tablet Take 1 tablet (20 mg total) by mouth daily. Need lab work 03/09/24   Domenica Harlene DELENA, MD  telmisartan -hydrochlorothiazide  (MICARDIS  HCT) 40-12.5 MG tablet TAKE 1 TABLET BY MOUTH EVERY DAY 02/07/24   Monetta Redell PARAS, MD  triamcinolone  (KENALOG ) 0.1 % Apply 1 application topically as needed. 09/27/20   Domenica Harlene DELENA, MD    Physical Exam    Vital Signs:  [name] Phlegm does not have vital signs available for review today.  118/62  Given telephonic nature of communication, physical exam is limited. AAOx3. NAD. Normal  affect.  Speech and respirations are unlabored.  Accessory Clinical Findings    None  Assessment & Plan    1.  Preoperative Cardiovascular Risk Assessment:  Mr. Stober's perioperative risk of a major cardiac event is 0.9% according to the Revised Cardiac Risk Index (RCRI).  Therefore, he is at low risk for perioperative complications.   His functional capacity is fair at 4.86 METs according to the [facility] Activity Status Index (DASI). Recommendations: According to ACC/AHA guidelines, no further cardiovascular testing needed.  The patient may proceed to surgery at acceptable risk.   Antiplatelet and/or Anticoagulation Recommendations: Aspirin  can be held for 5-7 days prior to his surgery.  Please resume Aspirin  post operatively when it is felt to be safe from a bleeding standpoint.   The patient was advised that if he develops new symptoms prior to surgery to contact our office to arrange for a follow-up visit, and he verbalized understanding.   A copy of this note will be routed to requesting  Careers adviser.  Time:   Today, I have spent 21 minutes with the patient with telehealth technology discussing medical history, symptoms, and management plan.     Orren LOISE Fabry, PA-C  04/03/2024, 11:06 AM

## 2024-04-06 DIAGNOSIS — M48062 Spinal stenosis, lumbar region with neurogenic claudication: Secondary | ICD-10-CM | POA: Diagnosis not present

## 2024-06-03 ENCOUNTER — Other Ambulatory Visit: Payer: Self-pay | Admitting: Family Medicine

## 2024-06-03 DIAGNOSIS — F17200 Nicotine dependence, unspecified, uncomplicated: Secondary | ICD-10-CM

## 2024-06-03 DIAGNOSIS — I10 Essential (primary) hypertension: Secondary | ICD-10-CM

## 2024-06-03 DIAGNOSIS — F32A Depression, unspecified: Secondary | ICD-10-CM

## 2024-06-03 DIAGNOSIS — E669 Obesity, unspecified: Secondary | ICD-10-CM

## 2024-06-03 DIAGNOSIS — E785 Hyperlipidemia, unspecified: Secondary | ICD-10-CM

## 2024-06-03 DIAGNOSIS — M545 Low back pain, unspecified: Secondary | ICD-10-CM

## 2024-10-07 ENCOUNTER — Ambulatory Visit: Admitting: Cardiology

## 2024-11-19 ENCOUNTER — Encounter: Admitting: Family Medicine

## 2025-01-12 ENCOUNTER — Ambulatory Visit
